# Patient Record
Sex: Female | Born: 1955
Health system: Southern US, Community
[De-identification: ages and names within clinical notes are randomized; demographics above are authoritative.]

## PROBLEM LIST (undated history)

## (undated) DIAGNOSIS — E785 Hyperlipidemia, unspecified: Secondary | ICD-10-CM

## (undated) DIAGNOSIS — I10 Essential (primary) hypertension: Secondary | ICD-10-CM

## (undated) DIAGNOSIS — Z8711 Personal history of peptic ulcer disease: Secondary | ICD-10-CM

## (undated) DIAGNOSIS — J449 Chronic obstructive pulmonary disease, unspecified: Secondary | ICD-10-CM

## (undated) DIAGNOSIS — G4730 Sleep apnea, unspecified: Secondary | ICD-10-CM

## (undated) DIAGNOSIS — K219 Gastro-esophageal reflux disease without esophagitis: Secondary | ICD-10-CM

## (undated) DIAGNOSIS — L570 Actinic keratosis: Secondary | ICD-10-CM

## (undated) DIAGNOSIS — K5792 Diverticulitis of intestine, part unspecified, without perforation or abscess without bleeding: Secondary | ICD-10-CM

## (undated) DIAGNOSIS — M1990 Unspecified osteoarthritis, unspecified site: Secondary | ICD-10-CM

## (undated) DIAGNOSIS — D509 Iron deficiency anemia, unspecified: Secondary | ICD-10-CM

## (undated) DIAGNOSIS — K635 Polyp of colon: Secondary | ICD-10-CM

## (undated) HISTORY — DX: Diverticulitis of intestine, part unspecified, without perforation or abscess without bleeding: K57.92

## (undated) HISTORY — DX: Actinic keratosis: L57.0

## (undated) HISTORY — DX: Sleep apnea, unspecified: G47.30

## (undated) HISTORY — PX: EYE SURGERY: SHX253

## (undated) HISTORY — DX: Hyperlipidemia, unspecified: E78.5

## (undated) HISTORY — DX: Gastro-esophageal reflux disease without esophagitis: K21.9

## (undated) HISTORY — DX: Polyp of colon: K63.5

---

## 1992-03-17 HISTORY — PX: SALPINGOOPHORECTOMY: SHX82

## 1992-03-17 HISTORY — PX: ABDOMINAL HYSTERECTOMY: SHX81

## 2005-02-24 ENCOUNTER — Ambulatory Visit: Payer: Self-pay | Admitting: Emergency Medicine

## 2005-03-27 ENCOUNTER — Ambulatory Visit: Payer: Self-pay | Admitting: Emergency Medicine

## 2012-02-23 LAB — HM COLONOSCOPY

## 2012-03-17 DIAGNOSIS — D509 Iron deficiency anemia, unspecified: Secondary | ICD-10-CM

## 2012-03-17 HISTORY — DX: Iron deficiency anemia, unspecified: D50.9

## 2013-01-11 ENCOUNTER — Ambulatory Visit: Payer: Self-pay | Admitting: Adult Health

## 2013-01-14 ENCOUNTER — Encounter (INDEPENDENT_AMBULATORY_CARE_PROVIDER_SITE_OTHER): Payer: Self-pay

## 2013-01-14 ENCOUNTER — Encounter: Payer: Self-pay | Admitting: Adult Health

## 2013-01-14 ENCOUNTER — Ambulatory Visit (INDEPENDENT_AMBULATORY_CARE_PROVIDER_SITE_OTHER): Payer: Managed Care, Other (non HMO) | Admitting: Adult Health

## 2013-01-14 VITALS — BP 118/66 | HR 78 | Temp 98.2°F | Resp 12 | Ht 65.0 in | Wt 191.5 lb

## 2013-01-14 DIAGNOSIS — G479 Sleep disorder, unspecified: Secondary | ICD-10-CM

## 2013-01-14 DIAGNOSIS — Z0000 Encounter for general adult medical examination without abnormal findings: Secondary | ICD-10-CM

## 2013-01-14 DIAGNOSIS — Z1239 Encounter for other screening for malignant neoplasm of breast: Secondary | ICD-10-CM

## 2013-01-14 DIAGNOSIS — Z23 Encounter for immunization: Secondary | ICD-10-CM

## 2013-01-14 MED ORDER — ZOLPIDEM TARTRATE 10 MG PO TABS: 10.0000 mg | ORAL_TABLET | Freq: Every evening | ORAL | Status: DC | PRN

## 2013-01-14 NOTE — Progress Notes (Signed)
Subjective:    Patient ID: Linda Henson, female    DOB: [birth year], 57 y.o.   MRN: [id number]  HPI  Patient presents to clinic to establish care. Patient moved to [place] for work last year. She has recently moved back to the area. She is feeling well overall. She has not seen a PCP in approximately 2 years 2/2 to move reported above. Her only concern is pertaining to inability to sleep. She has been taking an OTC product, sominex, for several years. The main ingredient is benadryl. This is no longer helping. She has also tried melatonin and valerian root without any benefit.    Past Medical History  Diagnosis Date  . Diverticulitis   . GERD (gastroesophageal reflux disease)   . Hyperlipidemia     Borderline   . Colon polyp     Repeat colonoscopy 2018     Past Surgical History  Procedure Laterality Date  . Abdominal hysterectomy  1994  . Salpingoophorectomy Right 1994     Family History  Problem Relation Age of Onset  . Heart disease Father 56    CAD - died of MI  . Hyperlipidemia Father   . Diabetes Paternal Grandmother   . Cancer Brother 41    stomach and esophageal cancer  . Hypothyroidism Sister      History   Social History  . Marital Status: Widowed    Spouse Name: N/A    Number of Children: 1  . Years of Education: 14   Occupational History  . Dedicated Estate manager/land agent   Social History Main Topics  . Smoking status: Former Smoker -- 4 years    Quit date: 12/15/1992  . Smokeless tobacco: Never Used  . Alcohol Use: 4.2 oz/week    7 Glasses of wine per week  . Drug Use: No  . Sexual Activity: Not on file   Other Topics Concern  . Not on file   Social History Narrative   Naziya grew up in [place], [place]. She is widowed for 5 years. She lives at home with her 9 year old mother. 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 She enjoys playing golf.      Review of Systems  Constitutional:  Negative.   HENT: Negative.   Eyes: Negative.   Respiratory: Negative.   Cardiovascular: Negative.   Gastrointestinal: Negative.   Endocrine: Negative.   Genitourinary: Negative.   Musculoskeletal: Negative.   Skin: Negative.   Allergic/Immunologic: Negative.   Neurological: Negative.   Hematological: Negative.   Psychiatric/Behavioral: Positive for sleep disturbance.       Objective:   Physical Exam  Constitutional: She is oriented to person, place, and time. She appears well-developed and well-nourished. No distress.  Pleasant 57 y/o female in NAD  HENT:  Head: Normocephalic and atraumatic.  Right Ear: External ear normal.  Left Ear: External ear normal.  Nose: Nose normal.  Mouth/Throat: Oropharynx is clear and moist.  Eyes: Conjunctivae and EOM are normal. Pupils are equal, round, and reactive to light.  Neck: Normal range of motion. Neck supple. No tracheal deviation present. No thyromegaly present.  Cardiovascular: Normal rate, regular rhythm, normal heart sounds and intact distal pulses.  Exam reveals no gallop and no friction rub.   No murmur heard. Pulmonary/Chest: Effort normal and breath sounds normal. No respiratory distress. She has no wheezes. She has no rales.  Abdominal: Soft. Bowel sounds are normal. She exhibits no  distension and no mass. There is no tenderness. There is no rebound and no guarding.  Musculoskeletal: Normal range of motion. She exhibits no edema and no tenderness.  Lymphadenopathy:    She has no cervical adenopathy.  Neurological: She is alert and oriented to person, place, and time. She has normal reflexes. No cranial nerve deficit. Coordination normal.  Skin: Skin is warm and dry.  Psychiatric: She has a normal mood and affect. Her behavior is normal. Judgment and thought content normal.          Assessment & Plan:

## 2013-01-14 NOTE — Patient Instructions (Signed)
   Thank you for choosing [facility] for your health care needs.  Please schedule your Mammogram at your earliest convenience and have them send me the report.  Have your fasting labs drawn. You may drink water.  Once I get the results of your labs I will fill out your Wellness form and notify you.  Start ambien 10 mg tablet - take 1/2 tablet as needed for sleep.

## 2013-01-15 DIAGNOSIS — Z0001 Encounter for general adult medical examination with abnormal findings: Secondary | ICD-10-CM | POA: Insufficient documentation

## 2013-01-15 DIAGNOSIS — Z1239 Encounter for other screening for malignant neoplasm of breast: Secondary | ICD-10-CM | POA: Insufficient documentation

## 2013-01-15 DIAGNOSIS — G479 Sleep disorder, unspecified: Secondary | ICD-10-CM | POA: Insufficient documentation

## 2013-01-15 DIAGNOSIS — Z79899 Other long term (current) drug therapy: Secondary | ICD-10-CM | POA: Insufficient documentation

## 2013-01-15 NOTE — Assessment & Plan Note (Signed)
Normal physical exam. Mammogram ordered. Labs: cbc w/diff, tsh, lipids, cmet, vit d

## 2013-01-15 NOTE — Assessment & Plan Note (Signed)
Mammogram ordered

## 2013-01-15 NOTE — Assessment & Plan Note (Signed)
Ambien 10 mg - 1/2 tablet at bedtime as needed.

## 2013-01-26 ENCOUNTER — Encounter: Payer: Self-pay | Admitting: Adult Health

## 2013-01-26 NOTE — Telephone Encounter (Signed)
Have you seen these labs she is referring to?

## 2013-01-27 ENCOUNTER — Telehealth: Payer: Self-pay | Admitting: *Deleted

## 2013-01-27 ENCOUNTER — Other Ambulatory Visit: Payer: Self-pay | Admitting: Adult Health

## 2013-01-27 DIAGNOSIS — D649 Anemia, unspecified: Secondary | ICD-10-CM

## 2013-01-27 DIAGNOSIS — K921 Melena: Secondary | ICD-10-CM

## 2013-01-27 NOTE — Telephone Encounter (Signed)
Pt notified, verbalized understanding to contact Dr. Urban Gibson office.

## 2013-01-27 NOTE — Telephone Encounter (Signed)
Spoke with pt regarding GI referral. States she has a GI doctor she has seen in the past, Dr. Chales Abrahams in [place], last colonoscopy and endoscopy were 2013, states results were "normal for me". Do you want her to follow up with him?

## 2013-01-27 NOTE — Telephone Encounter (Signed)
Blood in stool is concerning. I am also referring her to GI.

## 2013-01-27 NOTE — Telephone Encounter (Signed)
That would be fine - I want her to follow up as soon as possible.

## 2013-01-27 NOTE — Telephone Encounter (Signed)
Pt will come by office tomorrow afternoon for additional bloodwork. She is working in [place] today. She states no history of anemia. Has shortness of breath but this has been intermittent for years. No acute changes. States has noticed blood in stool starting yesterday, dark red/black, which is new for patient.

## 2013-01-28 ENCOUNTER — Other Ambulatory Visit: Payer: Self-pay | Admitting: Adult Health

## 2013-01-28 ENCOUNTER — Other Ambulatory Visit (INDEPENDENT_AMBULATORY_CARE_PROVIDER_SITE_OTHER): Payer: Managed Care, Other (non HMO)

## 2013-01-28 DIAGNOSIS — D649 Anemia, unspecified: Secondary | ICD-10-CM

## 2013-01-28 LAB — IRON: Iron: 19 ug/dL — ABNORMAL LOW (ref 42–145)

## 2013-01-28 LAB — FOLATE: Folate: 22.1 ng/mL (ref >5.9)

## 2013-01-28 MED ORDER — FERROUS SULFATE 325 (65 FE) MG PO TABS: 325.0000 mg | ORAL_TABLET | Freq: Two times a day (BID) | ORAL | Status: DC

## 2013-02-07 ENCOUNTER — Encounter: Payer: Self-pay | Admitting: Emergency Medicine

## 2013-02-08 ENCOUNTER — Telehealth: Payer: Self-pay

## 2013-02-08 NOTE — Telephone Encounter (Signed)
Notes Recorded by Dema Severin, RN on 01/31/2013 at 9:49 AM Left message, notifying pt of results and requested call back. ------  Notes Recorded by Orville Govern, NP on 01/28/2013 at 6:51 PM Significant iron deficiency. Needs to start iron supplement. I am referring to hematology for further evaluation of her low hemoglobin and iron. Find out if she already scheduled her appt with GI. She sees Dr. Chales Abrahams in [place] and said she was going to do this. She had been noticing blood in stool.              Pt states she needs referral to hematologist, and she prefers to see dr. Koleen Nimrod, and she also needs referral to GI, she is not going to see Dr. Chales Abrahams in [place] anymore.

## 2013-02-08 NOTE — Telephone Encounter (Signed)
Have we heard back from pt yet? If not, we need to send her a letter.

## 2013-02-09 ENCOUNTER — Other Ambulatory Visit: Payer: Self-pay | Admitting: Adult Health

## 2013-02-09 DIAGNOSIS — D509 Iron deficiency anemia, unspecified: Secondary | ICD-10-CM

## 2013-02-09 NOTE — Telephone Encounter (Signed)
It was at the bottom of Linda Henson's note "Pt states she needs referral to hematologist, and she prefers to see dr. Koleen Nimrod, and she also needs referral to GI, she is not going to see Dr. Chales Abrahams in [place] anymore."

## 2013-02-28 ENCOUNTER — Ambulatory Visit: Payer: Self-pay | Admitting: Internal Medicine

## 2013-02-28 ENCOUNTER — Ambulatory Visit: Payer: Self-pay | Admitting: Oncology

## 2013-02-28 LAB — IRON AND TIBC
Iron Bind.Cap.(Total): 411 ug/dL (ref 250–450)
Iron Saturation: 12 %
Iron: 48 ug/dL — ABNORMAL LOW (ref 50–170)
Unbound Iron-Bind.Cap.: 363 ug/dL

## 2013-03-17 ENCOUNTER — Ambulatory Visit: Payer: Self-pay | Admitting: Oncology

## 2013-03-17 ENCOUNTER — Ambulatory Visit: Payer: Self-pay | Admitting: Internal Medicine

## 2013-06-03 ENCOUNTER — Ambulatory Visit: Payer: Self-pay | Admitting: Internal Medicine

## 2013-06-03 LAB — CBC CANCER CENTER
BASOS ABS: 0.1 x10 3/mm (ref 0.0–0.1)
BASOS PCT: 0.8 %
EOS PCT: 2.8 %
Eosinophil #: 0.3 x10 3/mm (ref 0.0–0.7)
HCT: 41 % (ref 35.0–47.0)
HGB: 13.9 g/dL (ref 12.0–16.0)
Lymphocyte #: 3.2 x10 3/mm (ref 1.0–3.6)
Lymphocyte %: 34.8 %
MCH: 30.8 pg (ref 26.0–34.0)
MCHC: 33.9 g/dL (ref 32.0–36.0)
MCV: 91 fL (ref 80–100)
Monocyte #: 0.4 x10 3/mm (ref 0.2–0.9)
Monocyte %: 4.7 %
NEUTROS ABS: 5.2 x10 3/mm (ref 1.4–6.5)
NEUTROS PCT: 56.9 %
Platelet: 200 x10 3/mm (ref 150–440)
RBC: 4.5 10*6/uL (ref 3.80–5.20)
RDW: 14 % (ref 11.5–14.5)
WBC: 9.2 x10 3/mm (ref 3.6–11.0)

## 2013-06-03 LAB — IRON AND TIBC
Iron Bind.Cap.(Total): 389 ug/dL (ref 250–450)
Iron Saturation: 19 %
Iron: 74 ug/dL (ref 50–170)
Unbound Iron-Bind.Cap.: 315 ug/dL

## 2013-06-03 LAB — FERRITIN: Ferritin (ARMC): 26 ng/mL (ref 8–388)

## 2013-06-15 ENCOUNTER — Ambulatory Visit: Payer: Self-pay | Admitting: Internal Medicine

## 2013-07-19 ENCOUNTER — Other Ambulatory Visit: Payer: Self-pay | Admitting: Adult Health

## 2013-07-20 NOTE — Telephone Encounter (Signed)
Last visit 01/14/13, refill?

## 2014-02-22 ENCOUNTER — Ambulatory Visit (INDEPENDENT_AMBULATORY_CARE_PROVIDER_SITE_OTHER): Payer: Managed Care, Other (non HMO) | Admitting: Internal Medicine

## 2014-02-22 ENCOUNTER — Encounter (INDEPENDENT_AMBULATORY_CARE_PROVIDER_SITE_OTHER): Payer: Self-pay

## 2014-02-22 ENCOUNTER — Encounter: Payer: Self-pay | Admitting: Internal Medicine

## 2014-02-22 ENCOUNTER — Ambulatory Visit (INDEPENDENT_AMBULATORY_CARE_PROVIDER_SITE_OTHER)
Admission: RE | Admit: 2014-02-22 | Discharge: 2014-02-22 | Disposition: A | Discharge status: Home / Self Care | Payer: Managed Care, Other (non HMO) | Source: Ambulatory Visit | Attending: Internal Medicine | Admitting: Internal Medicine

## 2014-02-22 VITALS — BP 128/86 | HR 64 | Temp 98.3°F | Resp 14 | Ht 65.25 in | Wt 186.5 lb

## 2014-02-22 DIAGNOSIS — Z9889 Other specified postprocedural states: Secondary | ICD-10-CM

## 2014-02-22 DIAGNOSIS — R059 Cough, unspecified: Secondary | ICD-10-CM

## 2014-02-22 DIAGNOSIS — Z90710 Acquired absence of both cervix and uterus: Secondary | ICD-10-CM

## 2014-02-22 DIAGNOSIS — Z1239 Encounter for other screening for malignant neoplasm of breast: Secondary | ICD-10-CM

## 2014-02-22 DIAGNOSIS — R05 Cough: Secondary | ICD-10-CM

## 2014-02-22 DIAGNOSIS — K625 Hemorrhage of anus and rectum: Secondary | ICD-10-CM

## 2014-02-22 DIAGNOSIS — E669 Obesity, unspecified: Secondary | ICD-10-CM

## 2014-02-22 DIAGNOSIS — M5442 Lumbago with sciatica, left side: Secondary | ICD-10-CM

## 2014-02-22 DIAGNOSIS — Z0000 Encounter for general adult medical examination without abnormal findings: Secondary | ICD-10-CM

## 2014-02-22 DIAGNOSIS — Z131 Encounter for screening for diabetes mellitus: Secondary | ICD-10-CM

## 2014-02-22 DIAGNOSIS — J209 Acute bronchitis, unspecified: Secondary | ICD-10-CM

## 2014-02-22 DIAGNOSIS — D509 Iron deficiency anemia, unspecified: Secondary | ICD-10-CM

## 2014-02-22 LAB — POCT URINALYSIS DIPSTICK
Bilirubin, UA: NEGATIVE
Glucose, UA: NEGATIVE
KETONES UA: NEGATIVE
LEUKOCYTES UA: NEGATIVE
Nitrite, UA: NEGATIVE
PH UA: 6.5
Protein, UA: NEGATIVE
Spec Grav, UA: 1.02
Urobilinogen, UA: 1

## 2014-02-22 LAB — URINALYSIS, ROUTINE W REFLEX MICROSCOPIC
Bilirubin Urine: NEGATIVE
KETONES UR: NEGATIVE
Leukocytes, UA: NEGATIVE
Nitrite: NEGATIVE
SPECIFIC GRAVITY, URINE: 1.02 (ref 1.000–1.030)
TOTAL PROTEIN, URINE-UPE24: NEGATIVE
URINE GLUCOSE: NEGATIVE
Urobilinogen, UA: 0.2 (ref 0.0–1.0)
pH: 6.5 (ref 5.0–8.0)

## 2014-02-22 LAB — CBC WITH DIFFERENTIAL/PLATELET
HCT: 40 % (ref 36.0–46.0)
Hemoglobin: 13.7 g/dL (ref 12.0–15.0)
MCHC: 34.3 g/dL (ref 30.0–36.0)
MCV: 93.8 fl (ref 78.0–100.0)
Platelets: 244 10*3/uL (ref 150.0–400.0)
RBC: 4.26 Mil/uL (ref 3.87–5.11)
RDW: 13.1 % (ref 11.5–15.5)
WBC: 10.9 10*3/uL — ABNORMAL HIGH (ref 4.0–10.5)

## 2014-02-22 LAB — BASIC METABOLIC PANEL
BUN: 13 mg/dL (ref 6–23)
CO2: 27 meq/L (ref 19–32)
Calcium: 9.1 mg/dL (ref 8.4–10.5)
Chloride: 103 mEq/L (ref 96–112)
Creatinine, Ser: 0.8 mg/dL (ref 0.4–1.2)
GFR: 81.58 mL/min (ref >60.00)
GLUCOSE: 88 mg/dL (ref 70–99)
Potassium: 4.1 mEq/L (ref 3.5–5.1)
SODIUM: 138 meq/L (ref 135–145)

## 2014-02-22 LAB — IRON AND TIBC
%SAT: 17 % — ABNORMAL LOW (ref 20–55)
Iron: 69 ug/dL (ref 42–145)
TIBC: 416 ug/dL (ref 250–470)
UIBC: 347 ug/dL (ref 125–400)

## 2014-02-22 LAB — FERRITIN: FERRITIN: 19.8 ng/mL (ref 10.0–291.0)

## 2014-02-22 MED ORDER — ZOLPIDEM TARTRATE 10 MG PO TABS: 10.0000 mg | ORAL_TABLET | Freq: Every evening | ORAL | Status: DC | PRN

## 2014-02-22 MED ORDER — FLUTICASONE PROPIONATE 50 MCG/ACT NA SUSP: NASAL | Status: DC

## 2014-02-22 NOTE — Progress Notes (Signed)
Pre-visit discussion using our clinic review tool. No additional management support is needed unless otherwise documented below in the visit note.  

## 2014-02-22 NOTE — Patient Instructions (Signed)
CHEST X RAY TO BE DONE AT [facility]  WE WILL ALL YOU MOM TO GET HER LABS DONE ASAP AND I WILL SEE HER Thursday AT 2:00   RETURN FOR FASTING LABS ASAP (MAKE APPT )   Health Maintenance Adopting a healthy lifestyle and getting preventive care can go a long way to promote health and wellness. Talk with your health care provider about what schedule of regular examinations is right for you. This is a good chance for you to check in with your provider about disease prevention and staying healthy. In between checkups, there are plenty of things you can do on your own. Experts have done a lot of research about which lifestyle changes and preventive measures are most likely to keep you healthy. Ask your health care provider for more information. WEIGHT AND DIET  Eat a healthy diet  Be sure to include plenty of vegetables, fruits, low-fat dairy products, and lean protein.  Do not eat a lot of foods high in solid fats, added sugars, or salt.  Get regular exercise. This is one of the most important things you can do for your health.  Most adults should exercise for at least 150 minutes each week. The exercise should increase your heart rate and make you sweat (moderate-intensity exercise).  Most adults should also do strengthening exercises at least twice a week. This is in addition to the moderate-intensity exercise.  Maintain a healthy weight  Body mass index (BMI) is a measurement that can be used to identify possible weight problems. It estimates body fat based on height and weight. Your health care provider can help determine your BMI and help you achieve or maintain a healthy weight.  For females 15 years of age and older:   A BMI below 18.5 is considered underweight.  A BMI of 18.5 to 24.9 is normal.  A BMI of 25 to 29.9 is considered overweight.  A BMI of 30 and above is considered obese.  Watch levels of cholesterol and blood lipids  You should start having your blood tested  for lipids and cholesterol at 58 years of age, then have this test every 5 years.  You may need to have your cholesterol levels checked more often if:  Your lipid or cholesterol levels are high.  You are older than 58 years of age.  You are at high risk for heart disease.  CANCER SCREENING   Lung Cancer  Lung cancer screening is recommended for adults 71-20 years old who are at high risk for lung cancer because of a history of smoking.  A yearly low-dose CT scan of the lungs is recommended for people who:  Currently smoke.  Have quit within the past 15 years.  Have at least a 30-pack-year history of smoking. A pack year is smoking an average of one pack of cigarettes a day for 1 year.  Yearly screening should continue until it has been 15 years since you quit.  Yearly screening should stop if you develop a health problem that would prevent you from having lung cancer treatment.  Breast Cancer  Practice breast self-awareness. This means understanding how your breasts normally appear and feel.  It also means doing regular breast self-exams. Let your health care provider know about any changes, no matter how small.  If you are in your 20s or 30s, you should have a clinical breast exam (CBE) by a health care provider every 1-3 years as part of a regular health exam.  If you  are 48 or older, have a CBE every year. Also consider having a breast X-ray (mammogram) every year.  If you have a family history of breast cancer, talk to your health care provider about genetic screening.  If you are at high risk for breast cancer, talk to your health care provider about having an MRI and a mammogram every year.  Breast cancer gene (BRCA) assessment is recommended for women who have family members with BRCA-related cancers. BRCA-related cancers include:  Breast.  Ovarian.  Tubal.  Peritoneal cancers.  Results of the assessment will determine the need for genetic counseling and  BRCA1 and BRCA2 testing. Cervical Cancer Routine pelvic examinations to screen for cervical cancer are no longer recommended for nonpregnant women who are considered low risk for cancer of the pelvic organs (ovaries, uterus, and vagina) and who do not have symptoms. A pelvic examination may be necessary if you have symptoms including those associated with pelvic infections. Ask your health care provider if a screening pelvic exam is right for you.   The Pap test is the screening test for cervical cancer for women who are considered at risk.  If you had a hysterectomy for a problem that was not cancer or a condition that could lead to cancer, then you no longer need Pap tests.  If you are older than 65 years, and you have had normal Pap tests for the past 10 years, you no longer need to have Pap tests.  If you have had past treatment for cervical cancer or a condition that could lead to cancer, you need Pap tests and screening for cancer for at least 20 years after your treatment.  If you no longer get a Pap test, assess your risk factors if they change (such as having a new sexual partner). This can affect whether you should start being screened again.  Some women have medical problems that increase their chance of getting cervical cancer. If this is the case for you, your health care provider may recommend more frequent screening and Pap tests.  The human papillomavirus (HPV) test is another test that may be used for cervical cancer screening. The HPV test looks for the virus that can cause cell changes in the cervix. The cells collected during the Pap test can be tested for HPV.  The HPV test can be used to screen women 40 years of age and older. Getting tested for HPV can extend the interval between normal Pap tests from three to five years.  An HPV test also should be used to screen women of any age who have unclear Pap test results.  After 58 years of age, women should have HPV testing as  often as Pap tests.  Colorectal Cancer  This type of cancer can be detected and often prevented.  Routine colorectal cancer screening usually begins at 58 years of age and continues through 58 years of age.  Your health care provider may recommend screening at an earlier age if you have risk factors for colon cancer.  Your health care provider may also recommend using home test kits to check for hidden blood in the stool.  A small camera at the end of a tube can be used to examine your colon directly (sigmoidoscopy or colonoscopy). This is done to check for the earliest forms of colorectal cancer.  Routine screening usually begins at age 47.  Direct examination of the colon should be repeated every 5-10 years through 58 years of age. However, you may  need to be screened more often if early forms of precancerous polyps or small growths are found. Skin Cancer  Check your skin from head to toe regularly.  Tell your health care provider about any new moles or changes in moles, especially if there is a change in a mole's shape or color.  Also tell your health care provider if you have a mole that is larger than the size of a pencil eraser.  Always use sunscreen. Apply sunscreen liberally and repeatedly throughout the day.  Protect yourself by wearing long sleeves, pants, a wide-brimmed hat, and sunglasses whenever you are outside. HEART DISEASE, DIABETES, AND HIGH BLOOD PRESSURE   Have your blood pressure checked at least every 1-2 years. High blood pressure causes heart disease and increases the risk of stroke.  If you are between 31 years and 9 years old, ask your health care provider if you should take aspirin to prevent strokes.  Have regular diabetes screenings. This involves taking a blood sample to check your fasting blood sugar level.  If you are at a normal weight and have a low risk for diabetes, have this test once every three years after 58 years of age.  If you are  overweight and have a high risk for diabetes, consider being tested at a younger age or more often. PREVENTING INFECTION  Hepatitis B  If you have a higher risk for hepatitis B, you should be screened for this virus. You are considered at high risk for hepatitis B if:  You were born in a country where hepatitis B is common. Ask your health care provider which countries are considered high risk.  Your parents were born in a high-risk country, and you have not been immunized against hepatitis B (hepatitis B vaccine).  You have HIV or AIDS.  You use needles to inject street drugs.  You live with someone who has hepatitis B.  You have had sex with someone who has hepatitis B.  You get hemodialysis treatment.  You take certain medicines for conditions, including cancer, organ transplantation, and autoimmune conditions. Hepatitis C  Blood testing is recommended for:  Everyone born from [birth year] through 1965.  Anyone with known risk factors for hepatitis C. Sexually transmitted infections (STIs)  You should be screened for sexually transmitted infections (STIs) including gonorrhea and chlamydia if:  You are sexually active and are younger than 58 years of age.  You are older than 58 years of age and your health care provider tells you that you are at risk for this type of infection.  Your sexual activity has changed since you were last screened and you are at an increased risk for chlamydia or gonorrhea. Ask your health care provider if you are at risk.  If you do not have HIV, but are at risk, it may be recommended that you take a prescription medicine daily to prevent HIV infection. This is called pre-exposure prophylaxis (PrEP). You are considered at risk if:  You are sexually active and do not regularly use condoms or know the HIV status of your partner(s).  You take drugs by injection.  You are sexually active with a partner who has HIV. Talk with your health care provider  about whether you are at high risk of being infected with HIV. If you choose to begin PrEP, you should first be tested for HIV. You should then be tested every 3 months for as long as you are taking PrEP.  PREGNANCY   If you are  premenopausal and you may become pregnant, ask your health care provider about preconception counseling.  If you may become pregnant, take 400 to 800 micrograms (mcg) of folic acid every day.  If you want to prevent pregnancy, talk to your health care provider about birth control (contraception). OSTEOPOROSIS AND MENOPAUSE   Osteoporosis is a disease in which the bones lose minerals and strength with aging. This can result in serious bone fractures. Your risk for osteoporosis can be identified using a bone density scan.  If you are 62 years of age or older, or if you are at risk for osteoporosis and fractures, ask your health care provider if you should be screened.  Ask your health care provider whether you should take a calcium or vitamin D supplement to lower your risk for osteoporosis.  Menopause may have certain physical symptoms and risks.  Hormone replacement therapy may reduce some of these symptoms and risks. Talk to your health care provider about whether hormone replacement therapy is right for you.  HOME CARE INSTRUCTIONS   Schedule regular health, dental, and eye exams.  Stay current with your immunizations.   Do not use any tobacco products including cigarettes, chewing tobacco, or electronic cigarettes.  If you are pregnant, do not drink alcohol.  If you are breastfeeding, limit how much and how often you drink alcohol.  Limit alcohol intake to no more than 1 drink per day for nonpregnant women. One drink equals 12 ounces of beer, 5 ounces of wine, or 1 ounces of hard liquor.  Do not use street drugs.  Do not share needles.  Ask your health care provider for help if you need support or information about quitting drugs.  Tell your  health care provider if you often feel depressed.  Tell your health care provider if you have ever been abused or do not feel safe at home. Document Released: 09/16/2010 Document Revised: 07/18/2013 Document Reviewed: 02/02/2013 [facility] Patient Information 2015 [place], [place]. This information is not intended to replace advice given to you by your health care provider. Make sure you discuss any questions you have with your health care provider.

## 2014-02-22 NOTE — Progress Notes (Signed)
Patient ID: Linda Henson, female   DOB: 25-Nov-1955, 58 y.o.   MRN: [id number]   Subjective:     Linda Henson is a 58 y.o. female and is here for a comprehensive physical exam. The patient reports that she is S/p TAH in 1993 with unilateral oophorectomy   IDA treated with iron by [facility] and colonoscopy referral was done but no colonoscopy was done.  Has history of gastric ulcer and colon polyps , last colonoscopy was in  2013 in [place].  Had blood in stool on Monday  Treated for sinus infection by [facility] last Wednesday with augmentin  And inhaler,  But stopped the abx  On Monday when she had the blood in stool .  Pro air is helping ,  Has been evaluatedby pulmonogy in the past for chronic cough,  PFTS were reportedly normal ,  Sone 8 yrs ago in [place].  Using guaifenesin.  Uses nexium daily.  TAKES BENADRYL EVERY NIGHT INSTEAD OF AMBIEN   And cough is worse at night.  Dry cough .  Wants a chest x ray due to persistent chest congestion.  History of recurrent bronchitis. . X smoker quit 10 yrs ago,  Works in Surveyor, quantity,  Lots of talking during the day works for [employer]     History   Social History  . Marital Status: Widowed    Spouse Name: N/A    Number of Children: 1  . Years of Education: 14   Occupational History  . Dedicated Mining engineer   Social History Main Topics  . Smoking status: Former Smoker -- 4 years    Quit date: 12/15/1992  . Smokeless tobacco: Never Used  . Alcohol Use: 4.2 oz/week    7 Glasses of wine per week  . Drug Use: No  . Sexual Activity: Not on file   Other Topics Concern  . Not on file   Social History Narrative   Linda Henson grew up in [place], [place]. She is widowed for 5 years. She lives at home with her 45 year old mother. 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 She enjoys playing golf.    Health Maintenance  Topic Date Due  . PAP SMEAR  04/03/1973  .  TETANUS/TDAP  04/03/1974  . INFLUENZA VACCINE  10/16/2014  . MAMMOGRAM  01/29/2015  . COLONOSCOPY  02/22/2022    The following portions of the patient's history were reviewed and updated as appropriate: allergies, current medications, past family history, past medical history, past social history, past surgical history and problem list.  Review of Systems A comprehensive review of systems was negative.   Objective:   BP 128/86 mmHg  Pulse 64  Temp(Src) 98.3 F (36.8 C) (Oral)  Resp 14  Ht 5' 5.25" (1.657 m)  Wt 186 lb 8 oz (84.596 kg)  BMI 30.81 kg/m2  SpO2 96%  General appearance: alert, cooperative and appears stated age Head: Normocephalic, without obvious abnormality, atraumatic Eyes: conjunctivae/corneas clear. PERRL, EOM's intact. Fundi benign. Ears: normal TM's and external ear canals both ears Nose: Nares normal. Septum midline. Mucosa normal. No drainage or sinus tenderness. Throat: lips, mucosa, and tongue normal; teeth and gums normal Neck: no adenopathy, no carotid bruit, no JVD, supple, symmetrical, trachea midline and thyroid not enlarged, symmetric, no tenderness/mass/nodules Lungs: clear to auscultation bilaterally Breasts: normal appearance, no masses or tenderness Heart: regular rate and rhythm, S1, S2 normal, no murmur,  click, rub or gallop Abdomen: soft, non-tender; bowel sounds normal; no masses,  no organomegaly Extremities: extremities normal, atraumatic, no cyanosis or edema Pulses: 2+ and symmetric Skin: Skin color, texture, turgor normal. No rashes or lesions Neurologic: Alert and oriented X 3, normal strength and tone. Normal symmetric reflexes. Normal coordination and gait.   .    Assessment and Plan:   Routine general medical examination at a health care facility Annual wellness  exam was done as well as a comprehensive physical exam and management of acute and chronic conditions .  During the course of the visit the patient was educated and  counseled about appropriate screening and preventive services including :  diabetes screening, lipid analysis with projected  10 year  risk for CAD , nutrition counseling, colorectal cancer screening, and recommended immunizations.  Printed recommendations for health maintenance screenings was given.   Obesity I have addressed  BMI and recommended wt loss of 10% of body weigh over the next 6 months using a low glycemic index diet and regular exercise a minimum of 5 days per week.    Acute bronchitis Exam is norma.  Chest x ray is normal as well.   Updated Medication List Outpatient Encounter Prescriptions as of 02/22/2014  Medication Sig  . diphenhydrAMINE (SOMINEX) 25 MG tablet Take 25 mg by mouth at bedtime as needed for sleep.  . Esomeprazole Magnesium (NEXIUM 24HR PO) Take by mouth.  . ferrous sulfate 325 (65 FE) MG tablet Take 1 tablet (325 mg total) by mouth 2 (two) times daily with a meal. (Patient not taking: Reported on 02/22/2014)  . fluticasone (FLONASE) 50 MCG/ACT nasal spray 2 sprays in each nostril once daily  . zolpidem (AMBIEN) 10 MG tablet TAKE 1 TABLET BY MOUTH EVERY NIGHT AT BEDTIME AS NEEDED FOR SLEEP (Patient not taking: Reported on 02/22/2014)  . zolpidem (AMBIEN) 10 MG tablet Take 1 tablet (10 mg total) by mouth at bedtime as needed for sleep.

## 2014-02-23 ENCOUNTER — Telehealth: Payer: Self-pay | Admitting: *Deleted

## 2014-02-23 DIAGNOSIS — E669 Obesity, unspecified: Secondary | ICD-10-CM | POA: Insufficient documentation

## 2014-02-23 DIAGNOSIS — K625 Hemorrhage of anus and rectum: Secondary | ICD-10-CM | POA: Insufficient documentation

## 2014-02-23 DIAGNOSIS — J40 Bronchitis, not specified as acute or chronic: Secondary | ICD-10-CM | POA: Insufficient documentation

## 2014-02-23 DIAGNOSIS — J209 Acute bronchitis, unspecified: Secondary | ICD-10-CM | POA: Insufficient documentation

## 2014-02-23 NOTE — Telephone Encounter (Signed)
Linda Henson - Is this now your pt?

## 2014-02-23 NOTE — Assessment & Plan Note (Signed)
Exam is norma.  Chest x ray is normal as well.

## 2014-02-23 NOTE — Assessment & Plan Note (Addendum)
I have addressed  BMI and recommended wt loss of 10% of body weigh over the next 6 months using a low glycemic index diet and regular exercise a minimum of 5 days per week.   

## 2014-02-23 NOTE — Telephone Encounter (Signed)
Pt coming tomorrow what labs and dx? 

## 2014-02-23 NOTE — Assessment & Plan Note (Signed)

## 2014-02-24 ENCOUNTER — Other Ambulatory Visit: Payer: Self-pay | Admitting: Internal Medicine

## 2014-02-24 ENCOUNTER — Other Ambulatory Visit (INDEPENDENT_AMBULATORY_CARE_PROVIDER_SITE_OTHER): Payer: Managed Care, Other (non HMO)

## 2014-02-24 DIAGNOSIS — Z131 Encounter for screening for diabetes mellitus: Secondary | ICD-10-CM

## 2014-02-24 DIAGNOSIS — Z13220 Encounter for screening for lipoid disorders: Secondary | ICD-10-CM

## 2014-02-24 DIAGNOSIS — E669 Obesity, unspecified: Secondary | ICD-10-CM

## 2014-02-24 LAB — COMPREHENSIVE METABOLIC PANEL
ALT: 34 U/L (ref 0–35)
AST: 23 U/L (ref 0–37)
Albumin: 3.9 g/dL (ref 3.5–5.2)
Alkaline Phosphatase: 60 U/L (ref 39–117)
BILIRUBIN TOTAL: 0.7 mg/dL (ref 0.2–1.2)
BUN: 16 mg/dL (ref 6–23)
CO2: 25 meq/L (ref 19–32)
CREATININE: 0.7 mg/dL (ref 0.4–1.2)
Calcium: 9 mg/dL (ref 8.4–10.5)
Chloride: 106 mEq/L (ref 96–112)
GFR: 89.59 mL/min (ref >60.00)
GLUCOSE: 104 mg/dL — AB (ref 70–99)
Potassium: 4.2 mEq/L (ref 3.5–5.1)
Sodium: 135 mEq/L (ref 135–145)
Total Protein: 7.1 g/dL (ref 6.0–8.3)

## 2014-02-24 LAB — LIPID PANEL
CHOLESTEROL: 201 mg/dL — AB (ref 0–200)
HDL: 61.6 mg/dL (ref >39.00)
LDL Cholesterol: 115 mg/dL — ABNORMAL HIGH (ref 0–99)
NonHDL: 139.4
TRIGLYCERIDES: 123 mg/dL (ref 0.0–149.0)
Total CHOL/HDL Ratio: 3
VLDL: 24.6 mg/dL (ref 0.0–40.0)

## 2014-02-24 NOTE — Telephone Encounter (Signed)
No, not sure why she was on my schedule.  Too busy to ask questions,  Just did her annual and bronchitis follow up.

## 2014-02-24 NOTE — Addendum Note (Signed)
Addended by: Johnsie Cancel on: 02/24/2014 09:14 AM   Modules accepted: Orders

## 2014-02-24 NOTE — Telephone Encounter (Signed)
Linda Henson, This will be a patient of [facility]'s. I am not sure what labs she needs. I would recommend we wait until she establishes care with [facility] to draw labs.

## 2014-02-24 NOTE — Progress Notes (Signed)
Labs not drawn, no order.  As per note, no labs needed at this time.

## 2014-02-27 ENCOUNTER — Encounter: Payer: Self-pay | Admitting: *Deleted

## 2014-02-28 ENCOUNTER — Telehealth: Payer: Self-pay | Admitting: Internal Medicine

## 2014-02-28 NOTE — Telephone Encounter (Signed)
Patient needs to sign and date and provide insurance ID number called patient and left message.

## 2014-04-14 ENCOUNTER — Ambulatory Visit: Payer: Self-pay | Admitting: Internal Medicine

## 2014-04-14 LAB — HM MAMMOGRAPHY: HM Mammogram: NEGATIVE

## 2014-04-18 ENCOUNTER — Encounter: Payer: Self-pay | Admitting: *Deleted

## 2014-09-23 ENCOUNTER — Other Ambulatory Visit: Payer: Self-pay | Admitting: Internal Medicine

## 2014-09-25 NOTE — Telephone Encounter (Signed)
Last filled and last OV on 02/22/14, 30 tablets with 5 refills.  Seen you once, former [facility] pt, Okay to refill?

## 2014-09-26 ENCOUNTER — Telehealth: Payer: Self-pay

## 2014-09-26 ENCOUNTER — Telehealth: Payer: Self-pay | Admitting: *Deleted

## 2014-09-26 NOTE — Telephone Encounter (Signed)
Called patient and made aware Rx was sent to pharmacy. Patient verbalized understanding. Appointment made with NP to establish care and manage medications.

## 2014-09-26 NOTE — Telephone Encounter (Signed)
Left vm for pt to return my call, appt needs to be scheduled  Per Dr Derrel Nip - zolpidem (AMBIEN) 10 MG tablet medication was refilled as requested, but only for 30 days only. PLEASE SCHEDULE APPT WITH [facility] AS NEW PCP

## 2014-09-26 NOTE — Telephone Encounter (Signed)
Refill for 30 days only.  PLEASE SCHEDULE APPT WITH [facility] AS NEW PCP

## 2014-10-01 ENCOUNTER — Encounter: Payer: Self-pay | Admitting: Family Medicine

## 2014-11-03 ENCOUNTER — Ambulatory Visit (INDEPENDENT_AMBULATORY_CARE_PROVIDER_SITE_OTHER): Payer: Managed Care, Other (non HMO) | Admitting: Nurse Practitioner

## 2014-11-03 ENCOUNTER — Encounter: Payer: Self-pay | Admitting: Nurse Practitioner

## 2014-11-03 VITALS — BP 110/70 | HR 72 | Temp 98.2°F | Resp 16 | Ht 65.0 in | Wt 195.3 lb

## 2014-11-03 DIAGNOSIS — G479 Sleep disorder, unspecified: Secondary | ICD-10-CM

## 2014-11-03 DIAGNOSIS — E669 Obesity, unspecified: Secondary | ICD-10-CM

## 2014-11-03 DIAGNOSIS — R1012 Left upper quadrant pain: Secondary | ICD-10-CM

## 2014-11-03 DIAGNOSIS — K219 Gastro-esophageal reflux disease without esophagitis: Secondary | ICD-10-CM

## 2014-11-03 DIAGNOSIS — G4733 Obstructive sleep apnea (adult) (pediatric): Secondary | ICD-10-CM

## 2014-11-03 DIAGNOSIS — Z9989 Dependence on other enabling machines and devices: Secondary | ICD-10-CM

## 2014-11-03 MED ORDER — ZOLPIDEM TARTRATE 10 MG PO TABS: 10.0000 mg | ORAL_TABLET | Freq: Every evening | ORAL | Status: DC | PRN

## 2014-11-03 NOTE — Progress Notes (Signed)
Pre visit review using our clinic review tool, if applicable. No additional management support is needed unless otherwise documented below in the visit note. 

## 2014-11-03 NOTE — Patient Instructions (Signed)
Follow up in Dec. For repeat of lab work.

## 2014-11-03 NOTE — Progress Notes (Signed)
Patient ID: Linda Henson, female    DOB: 1955-12-20  Age: 59 y.o. MRN: [id number]  CC: Medication Refill   HPI Linda Henson presents for medications. She is already established with [place] and was a former Linda Salvage, NP patient.   1) Sleep study- CPAP getting set up for this, asked for copy of records regarding sleep study results for scanning into our system since we do not currently have.   2) Ambien- Doing well. Takes 1/2 tablet as needed for sleep.   3) Pt does not like nexium and is still having some LUQ twitching (see below), she states this was worked up previously and found to be GERD. She would like to try something else.   4) Rash- between butt cheeks- 2-3 months, hydrocortisone is helpful   5) Left upper quadrant- off and on for years, last 2-3 months twinges happens every day, takes probiotics and they are helpful, BMs normal yesterday last one   History Linda Henson has a past medical history of Diverticulitis; GERD (gastroesophageal reflux disease); Hyperlipidemia; Colon polyp; and Sleep apnea.   She has past surgical history that includes Abdominal hysterectomy (1994) and Salpingoophorectomy (Right, 1994).   Her family history includes Cancer (age of onset: 47) in her brother; Diabetes in her paternal grandmother; Heart disease (age of onset: 41) in her father; Hyperlipidemia in her father; Hypothyroidism in her sister.She reports that she quit smoking about 21 years ago. She has never used smokeless tobacco. She reports that she drinks about 4.2 oz of alcohol per week. She reports that she does not use illicit drugs.  Outpatient Prescriptions Prior to Visit  Medication Sig Dispense Refill  . fluticasone (FLONASE) 50 MCG/ACT nasal spray 2 sprays in each nostril once daily 16 g 6  . zolpidem (AMBIEN) 10 MG tablet TAKE 1 TABLET BY MOUTH EVERY NIGHT AT BEDTIME AS NEEDED FOR SLEEP 15 tablet 3  . zolpidem (AMBIEN) 10 MG tablet TAKE 1 TABLET BY MOUTH EVERY NIGHT AT BEDTIME AS NEEDED FOR  SLEEP 30 tablet 0  . diphenhydrAMINE (SOMINEX) 25 MG tablet Take 25 mg by mouth at bedtime as needed for sleep.    . Esomeprazole Magnesium (NEXIUM 24HR PO) Take by mouth.    . ferrous sulfate 325 (65 FE) MG tablet Take 1 tablet (325 mg total) by mouth 2 (two) times daily with a meal. (Patient not taking: Reported on 02/22/2014) 60 tablet 2  . zolpidem (AMBIEN) 10 MG tablet Take 1 tablet (10 mg total) by mouth at bedtime as needed for sleep. (Patient not taking: Reported on 11/03/2014) 30 tablet 5   No facility-administered medications prior to visit.    ROS Review of Systems  Constitutional: Negative for fever, chills, diaphoresis and fatigue.  Respiratory: Negative for chest tightness, shortness of breath and wheezing.   Cardiovascular: Negative for chest pain, palpitations and leg swelling.  Gastrointestinal: Positive for abdominal pain. Negative for nausea, vomiting, diarrhea, constipation, blood in stool, abdominal distention and rectal pain.  Skin: Positive for rash.  Neurological: Negative for dizziness, weakness, numbness and headaches.  Psychiatric/Behavioral: Positive for sleep disturbance. Negative for suicidal ideas. The patient is nervous/anxious.     Objective:  BP 110/70 mmHg  Pulse 72  Temp(Src) 98.2 F (36.8 C)  Resp 16  Ht 5\' 5"  (1.651 m)  Wt 195 lb 4.8 oz (88.587 kg)  BMI 32.50 kg/m2  SpO2 97%  Physical Exam  Constitutional: She is oriented to person, place, and time. She appears well-developed and well-nourished. No distress.  HENT:  Head: Normocephalic and atraumatic.  Right Ear: External ear normal.  Left Ear: External ear normal.  Cardiovascular: Normal rate, regular rhythm, normal heart sounds and intact distal pulses.  Exam reveals no gallop and no friction rub.   No murmur heard. Pulmonary/Chest: Effort normal and breath sounds normal. No respiratory distress. She has no wheezes. She has no rales. She exhibits no tenderness.  Abdominal: Soft. Bowel  sounds are normal. She exhibits no distension and no mass. There is no tenderness. There is no rebound and no guarding.  Neurological: She is alert and oriented to person, place, and time. No cranial nerve deficit. She exhibits normal muscle tone. Coordination normal.  Skin: Skin is warm and dry. She is not diaphoretic.  Did not examine rash today, deferred due to pt request; asked pt to let me know if it changes or worsens  Psychiatric: She has a normal mood and affect. Her behavior is normal. Judgment and thought content normal.   Assessment & Plan:   [name] was seen today for medication refill.  Diagnoses and all orders for this visit:  OSA on CPAP  Sleep disturbance  Gastroesophageal reflux disease, esophagitis presence not specified  LUQ pain  Obesity  Other orders -     zolpidem (AMBIEN) 10 MG tablet; Take 1 tablet (10 mg total) by mouth at bedtime as needed. for sleep   I have discontinued [name]'s Esomeprazole Magnesium (NEXIUM 24HR PO), diphenhydrAMINE, ferrous sulfate, zolpidem, and zolpidem. I have also changed her zolpidem. Additionally, I am having her maintain her fluticasone and pantoprazole.  Meds ordered this encounter  Medications  . pantoprazole (PROTONIX) 40 MG tablet    Sig: Take 40 mg by mouth daily.  Marland Kitchen zolpidem (AMBIEN) 10 MG tablet    Sig: Take 1 tablet (10 mg total) by mouth at bedtime as needed. for sleep    Dispense:  30 tablet    Refill:  5    Order Specific Question:  Supervising Provider    Answer:  Crecencio Mc [[id number]]     Follow-up: Return in about 4 months (around 03/05/2015).

## 2014-11-12 ENCOUNTER — Encounter: Payer: Self-pay | Admitting: Nurse Practitioner

## 2014-11-12 DIAGNOSIS — G4733 Obstructive sleep apnea (adult) (pediatric): Secondary | ICD-10-CM | POA: Insufficient documentation

## 2014-11-12 DIAGNOSIS — R1012 Left upper quadrant pain: Secondary | ICD-10-CM | POA: Insufficient documentation

## 2014-11-12 DIAGNOSIS — Z9989 Dependence on other enabling machines and devices: Secondary | ICD-10-CM | POA: Insufficient documentation

## 2014-11-12 DIAGNOSIS — K219 Gastro-esophageal reflux disease without esophagitis: Secondary | ICD-10-CM | POA: Insufficient documentation

## 2014-11-12 HISTORY — DX: Left upper quadrant pain: R10.12

## 2014-11-12 NOTE — Assessment & Plan Note (Signed)
Pt is stable on Ambien 10 mg (taking 1/2 tablet) as needed QHS. Will follow.

## 2014-11-12 NOTE — Assessment & Plan Note (Signed)
Wt Readings from Last 3 Encounters:  11/03/14 195 lb 4.8 oz (88.587 kg)  02/22/14 186 lb 8 oz (84.596 kg)  01/14/13 191 lb 8 oz (86.864 kg)   Asked pt to watch diet, cut down on portions and carbs, and add exercise to weekly routine.

## 2014-11-12 NOTE — Assessment & Plan Note (Signed)
Stable. No findings, asked pt to continue probiotics and if continues, changes, or worsens will obtain US.

## 2014-11-12 NOTE — Assessment & Plan Note (Signed)
Pt is not taking Nexium. She is still having GERD symptoms and she would like to try something else. Will switch to Protonix 40 mg daily for GERD. Asked pt to watch for trigger foods.

## 2014-11-12 NOTE — Assessment & Plan Note (Signed)
She recently had a sleep study and was dx with OA. She is getting set up for her CPAP supplies.

## 2015-02-02 ENCOUNTER — Encounter: Payer: Self-pay | Admitting: *Deleted

## 2015-02-05 ENCOUNTER — Encounter
Admission: RE | Disposition: A | Discharge status: Home / Self Care | Payer: Self-pay | Source: Ambulatory Visit | Attending: Gastroenterology

## 2015-02-05 ENCOUNTER — Ambulatory Visit: Payer: Managed Care, Other (non HMO) | Admitting: Anesthesiology

## 2015-02-05 ENCOUNTER — Encounter: Payer: Self-pay | Admitting: *Deleted

## 2015-02-05 ENCOUNTER — Ambulatory Visit
Admission: RE | Admit: 2015-02-05 | Discharge: 2015-02-05 | Disposition: A | Discharge status: Home / Self Care | Payer: Managed Care, Other (non HMO) | Source: Ambulatory Visit | Attending: Gastroenterology | Admitting: Gastroenterology

## 2015-02-05 DIAGNOSIS — Z90710 Acquired absence of both cervix and uterus: Secondary | ICD-10-CM | POA: Diagnosis not present

## 2015-02-05 DIAGNOSIS — K640 First degree hemorrhoids: Secondary | ICD-10-CM | POA: Insufficient documentation

## 2015-02-05 DIAGNOSIS — K625 Hemorrhage of anus and rectum: Secondary | ICD-10-CM | POA: Diagnosis not present

## 2015-02-05 DIAGNOSIS — Z87891 Personal history of nicotine dependence: Secondary | ICD-10-CM | POA: Diagnosis not present

## 2015-02-05 DIAGNOSIS — E785 Hyperlipidemia, unspecified: Secondary | ICD-10-CM | POA: Insufficient documentation

## 2015-02-05 DIAGNOSIS — Z79899 Other long term (current) drug therapy: Secondary | ICD-10-CM | POA: Insufficient documentation

## 2015-02-05 DIAGNOSIS — K219 Gastro-esophageal reflux disease without esophagitis: Secondary | ICD-10-CM | POA: Insufficient documentation

## 2015-02-05 DIAGNOSIS — Z86010 Personal history of colonic polyps: Secondary | ICD-10-CM | POA: Diagnosis not present

## 2015-02-05 DIAGNOSIS — K644 Residual hemorrhoidal skin tags: Secondary | ICD-10-CM | POA: Insufficient documentation

## 2015-02-05 DIAGNOSIS — G4730 Sleep apnea, unspecified: Secondary | ICD-10-CM | POA: Insufficient documentation

## 2015-02-05 HISTORY — PX: COLONOSCOPY WITH PROPOFOL: SHX5780

## 2015-02-05 HISTORY — DX: Iron deficiency anemia, unspecified: D50.9

## 2015-02-05 SURGERY — COLONOSCOPY WITH PROPOFOL
Anesthesia: General

## 2015-02-05 MED ORDER — FENTANYL CITRATE (PF) 100 MCG/2ML IJ SOLN
INTRAMUSCULAR | Status: DC | PRN
  Administered 2015-02-05: 25 ug via INTRAVENOUS

## 2015-02-05 MED ORDER — FENTANYL CITRATE (PF) 100 MCG/2ML IJ SOLN: 25.0000 ug | INTRAMUSCULAR | Status: DC | PRN

## 2015-02-05 MED ORDER — ONDANSETRON HCL 4 MG/2ML IJ SOLN: 4.0000 mg | Freq: Once | INTRAMUSCULAR | Status: DC | PRN

## 2015-02-05 MED ORDER — PROPOFOL 500 MG/50ML IV EMUL
INTRAVENOUS | Status: DC | PRN
  Administered 2015-02-05: 150 ug/kg/min via INTRAVENOUS

## 2015-02-05 MED ORDER — LIDOCAINE HCL (CARDIAC) 20 MG/ML IV SOLN
INTRAVENOUS | Status: DC | PRN
  Administered 2015-02-05: 60 mg via INTRAVENOUS

## 2015-02-05 MED ORDER — SODIUM CHLORIDE 0.9 % IV SOLN
INTRAVENOUS | Status: DC
  Administered 2015-02-05: 1000 mL via INTRAVENOUS

## 2015-02-05 MED ORDER — GLYCOPYRROLATE 0.2 MG/ML IJ SOLN
INTRAMUSCULAR | Status: DC | PRN
  Administered 2015-02-05: 0.2 mg via INTRAVENOUS

## 2015-02-05 MED ORDER — PROPOFOL 10 MG/ML IV BOLUS
INTRAVENOUS | Status: DC | PRN
  Administered 2015-02-05 (×2): 50 mg via INTRAVENOUS

## 2015-02-05 MED ORDER — MIDAZOLAM HCL 2 MG/2ML IJ SOLN
INTRAMUSCULAR | Status: DC | PRN
  Administered 2015-02-05: 1 mg via INTRAVENOUS

## 2015-02-05 NOTE — Discharge Instructions (Signed)

## 2015-02-05 NOTE — Anesthesia Preprocedure Evaluation (Signed)
Anesthesia Evaluation  Patient identified by MRN, date of birth, ID band Patient awake    Reviewed: Allergy & Precautions, NPO status , Patient's Chart, lab work & pertinent test results  Airway Mallampati: III  TM Distance: <3 FB Neck ROM: Full    Dental no notable dental hx. (+) Chipped   Pulmonary sleep apnea and Continuous Positive Airway Pressure Ventilation , former smoker,  Bronchitis hx   Pulmonary exam normal        Cardiovascular negative cardio ROS Normal cardiovascular exam     Neuro/Psych negative neurological ROS  negative psych ROS   GI/Hepatic Neg liver ROS, GERD  Medicated and Controlled,Colon polyp   Endo/Other  negative endocrine ROS  Renal/GU negative Renal ROS     Musculoskeletal negative musculoskeletal ROS (+)   Abdominal Normal abdominal exam  (+)   Peds  Hematology  (+) anemia ,   Anesthesia Other Findings   Reproductive/Obstetrics                             Anesthesia Physical Anesthesia Plan  ASA: II  Anesthesia Plan: General   Post-op Pain Management:    Induction: Intravenous  Airway Management Planned: Nasal Cannula  Additional Equipment:   Intra-op Plan:   Post-operative Plan:   Informed Consent: I have reviewed the patients History and Physical, chart, labs and discussed the procedure including the risks, benefits and alternatives for the proposed anesthesia with the patient or authorized representative who has indicated his/her understanding and acceptance.   Dental advisory given  Plan Discussed with: CRNA and Surgeon  Anesthesia Plan Comments:         Anesthesia Quick Evaluation

## 2015-02-05 NOTE — Transfer of Care (Signed)
Immediate Anesthesia Transfer of Care Note  Patient: Linda Henson  Procedure(s) Performed: Procedure(s): COLONOSCOPY WITH PROPOFOL (N/A)  Patient Location: PACU  Anesthesia Type:General  Level of Consciousness: awake, alert  and oriented  Airway & Oxygen Therapy: Patient Spontanous Breathing and Patient connected to nasal cannula oxygen  Post-op Assessment: Report given to RN and Post -op Vital signs reviewed and stable  Post vital signs: stable  Last Vitals:  Filed Vitals:   02/05/15 0935 02/05/15 1037  BP: 143/90 105/68  Pulse: 67 65  Temp: 36.2 C 36.2 C  Resp: 18 17    Complications: No apparent anesthesia complications

## 2015-02-05 NOTE — H&P (Signed)
  Primary Care Physician:  Rubbie Battiest, NP  Pre-Procedure History & Physical: HPI:  Linda Henson is a 59 y.o. female is here for an colonoscopy.   Past Medical History  Diagnosis Date  . Diverticulitis   . GERD (gastroesophageal reflux disease)   . Hyperlipidemia     Borderline   . Colon polyp     Repeat colonoscopy 2018  . Sleep apnea   . Iron deficiency anemia 2014    Past Surgical History  Procedure Laterality Date  . Abdominal hysterectomy  1994  . Salpingoophorectomy Right 1994  . Eye surgery      Prior to Admission medications   Medication Sig Start Date End Date Taking? Authorizing Provider  ibuprofen (ADVIL,MOTRIN) 200 MG tablet Take 200 mg by mouth every 6 (six) hours as needed.   Yes Historical Provider, MD  fluticasone (FLONASE) 50 MCG/ACT nasal spray 2 sprays in each nostril once daily 02/22/14   Crecencio Mc, MD  pantoprazole (PROTONIX) 40 MG tablet Take 40 mg by mouth daily.    Historical Provider, MD  zolpidem (AMBIEN) 10 MG tablet Take 1 tablet (10 mg total) by mouth at bedtime as needed. for sleep 11/03/14   Rubbie Battiest, NP    Allergies as of 01/09/2015  . (No Known Allergies)    Family History  Problem Relation Age of Onset  . Heart disease Father 27    CAD - died of MI  . Hyperlipidemia Father   . Diabetes Paternal Grandmother   . Cancer Brother 41    stomach and esophageal cancer  . Hypothyroidism Sister     Social History   Social History  . Marital Status: Widowed    Spouse Name: N/A  . Number of Children: 1  . Years of Education: 14   Occupational History  . Dedicated Mining engineer   Social History Main Topics  . Smoking status: Former Smoker -- 4 years    Quit date: 12/15/1992  . Smokeless tobacco: Never Used  . Alcohol Use: 4.2 oz/week    7 Glasses of wine per week  . Drug Use: No  . Sexual Activity: Not on file   Other Topics Concern  . Not on file   Social History Narrative   Linda Henson grew up in  [place], [place]. She is widowed for 5 years. She lives at home with her 23 year old mother. 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 She enjoys playing golf.      Physical Exam: BP 143/90 mmHg  Pulse 67  Temp(Src) 97.2 F (36.2 C) (Tympanic)  Resp 18  Ht 5\' 5"  (1.651 m)  Wt 83.462 kg (184 lb)  BMI 30.62 kg/m2  SpO2 98% General:   Alert,  pleasant and cooperative in NAD Head:  Normocephalic and atraumatic. Neck:  Supple; no masses or thyromegaly. Lungs:  Clear throughout to auscultation.    Heart:  Regular rate and rhythm. Abdomen:  Soft, nontender and nondistended. Normal bowel sounds, without guarding, and without rebound.   Neurologic:  Alert and  oriented x4;  grossly normal neurologically.  Impression/Plan: Linda Henson is here for an colonoscopy to be performed for rectal bleeding  Risks, benefits, limitations, and alternatives regarding  colonoscopy have been reviewed with the patient.  Questions have been answered.  All parties agreeable.   Josefine Class, MD  02/05/2015, 9:47 AM

## 2015-02-05 NOTE — Op Note (Signed)
[facility] Patient Name: Linda Henson Procedure Date: 02/05/2015 9:49 AM MRN: [id number] Account #: [id number] Date of Birth: 27-Jan-1956 Admit Type: Outpatient Age: 59 Room: [facility] ENDO ROOM 3 Gender: Female Note Status: Finalized Procedure:         Colonoscopy Indications:       Rectal bleeding, Personal history of colonic polyps Patient Profile:   This is a 59 year old female. Providers:         Gerrit Heck. Rayann Heman, MD Medicines:         Propofol per Anesthesia Complications:     No immediate complications. Procedure:         Pre-Anesthesia Assessment:                    - Prior to the procedure, a History and Physical was                     performed, and patient medications, allergies and                     sensitivities were reviewed. The patient's tolerance of                     previous anesthesia was reviewed.                    After obtaining informed consent, the colonoscope was                     passed under direct vision. Throughout the procedure, the                     patient's blood pressure, pulse, and oxygen saturations                     were monitored continuously. The Olympus CF-H180AL                     colonoscope ( S#: [id number] ) was introduced through the                     anus and advanced to the the cecum, identified by                     appendiceal orifice and ileocecal valve. The colonoscopy                     was performed without difficulty. The patient tolerated                     the procedure well. The quality of the bowel preparation                     was excellent. Findings:      The perianal exam findings include large non-thrombosed external       hemorrhoids.      Internal hemorrhoids were found during retroflexion. The hemorrhoids       were Grade I (internal hemorrhoids that do not prolapse). Impression:        - Large non-thrombosed external hemorrhoids found on   perianal exam.                    - Internal hemorrhoids.                    -  No specimens collected. Recommendation:    - Observe patient in GI recovery unit.                    - High fiber diet.                    - Continue present medications.                    - Repeat colonoscopy in 5 years for surveillance.                    - Return to referring physician.                    - Refer to a surgeon at appointment to be scheduled for                     large external hemorrrhoids Procedure Code(s): --- Professional ---                    [phone], Colonoscopy, flexible; diagnostic, including                     collection of specimen(s) by brushing or washing, when                     performed (separate procedure) Diagnosis Code(s): --- Professional ---                    K64.0, First degree hemorrhoids                    K64.4, Residual hemorrhoidal skin tags                    K62.5, Hemorrhage of anus and rectum                    Z86.010, Personal history of colonic polyps CPT copyright 2014 American Medical Association. All rights reserved. The codes documented in this report are preliminary and upon coder review may  be revised to meet current compliance requirements. Mellody Life, MD 02/05/2015 10:34:07 AM This report has been signed electronically. Number of Addenda: 0 Note Initiated On: 02/05/2015 9:49 AM Scope Withdrawal Time: 0 hours 12 minutes 45 seconds  Total Procedure Duration: 0 hours 16 minutes 40 seconds       [facility]

## 2015-02-06 ENCOUNTER — Encounter: Payer: Self-pay | Admitting: Gastroenterology

## 2015-02-09 NOTE — Anesthesia Postprocedure Evaluation (Signed)
Anesthesia Post Note  Patient: Linda Henson  Procedure(s) Performed: Procedure(s) (LRB): COLONOSCOPY WITH PROPOFOL (N/A)  Patient location during evaluation: Endoscopy Anesthesia Type: General Level of consciousness: awake, awake and alert and oriented Pain management: pain level controlled Vital Signs Assessment: post-procedure vital signs reviewed and stable Respiratory status: spontaneous breathing Cardiovascular status: blood pressure returned to baseline Anesthetic complications: no    Last Vitals:  Filed Vitals:   02/05/15 1100 02/05/15 1110  BP: 124/82 116/75  Pulse: 55 64  Temp:    Resp: 12 14    Last Pain:  Filed Vitals:   02/06/15 0735  PainSc: 0-No pain                 Amandamarie Feggins

## 2015-02-14 ENCOUNTER — Ambulatory Visit: Payer: Self-pay | Admitting: General Surgery

## 2015-02-14 ENCOUNTER — Encounter: Payer: Self-pay | Admitting: General Surgery

## 2015-02-14 ENCOUNTER — Ambulatory Visit (INDEPENDENT_AMBULATORY_CARE_PROVIDER_SITE_OTHER): Payer: Managed Care, Other (non HMO) | Admitting: General Surgery

## 2015-02-14 VITALS — BP 138/78 | HR 76 | Resp 12 | Ht 65.0 in | Wt 193.0 lb

## 2015-02-14 DIAGNOSIS — K644 Residual hemorrhoidal skin tags: Secondary | ICD-10-CM

## 2015-02-14 DIAGNOSIS — K648 Other hemorrhoids: Secondary | ICD-10-CM | POA: Diagnosis not present

## 2015-02-14 NOTE — Progress Notes (Signed)
Patient ID: Linda Henson, female   DOB: 10/18/1955, 59 y.o.   MRN: [id number]  Chief Complaint  Patient presents with  . Other    hemorrhoids    HPI Linda Henson is a 59 y.o. female here today for a evaluation of hemorrhoids. Patient states they have been there for years. In the last two months she noticed some blood in her stool. She has had some blood in the bowl and on the paper.Her first bowel movement of the day she noticed more bleeding . Last colonoscopy was 02/05/15. Last episode of bleeding was one month ago. HPI  Past Medical History  Diagnosis Date  . Diverticulitis   . GERD (gastroesophageal reflux disease)   . Hyperlipidemia     Borderline   . Colon polyp     Repeat colonoscopy 2018  . Sleep apnea   . Iron deficiency anemia 2014    Past Surgical History  Procedure Laterality Date  . Abdominal hysterectomy  1994  . Salpingoophorectomy Right 1994  . Eye surgery    . Colonoscopy with propofol N/A 02/05/2015    Procedure: COLONOSCOPY WITH PROPOFOL;  Surgeon: Josefine Class, MD;  Location: [facility];  Service: Endoscopy;  Laterality: N/A;    Family History  Problem Relation Age of Onset  . Heart disease Father 29    CAD - died of MI  . Hyperlipidemia Father   . Diabetes Paternal Grandmother   . Cancer Brother 41    stomach and esophageal cancer  . Hypothyroidism Sister     Social History Social History  Substance Use Topics  . Smoking status: Former Smoker -- 4 years    Quit date: 12/15/1992  . Smokeless tobacco: Never Used  . Alcohol Use: 4.2 oz/week    7 Glasses of wine per week    No Known Allergies  Current Outpatient Prescriptions  Medication Sig Dispense Refill  . fluticasone (FLONASE) 50 MCG/ACT nasal spray 2 sprays in each nostril once daily 16 g 6  . ibuprofen (ADVIL,MOTRIN) 200 MG tablet Take 200 mg by mouth every 6 (six) hours as needed.    . pantoprazole (PROTONIX) 40 MG tablet Take 40 mg by mouth daily.    Marland Kitchen zolpidem  (AMBIEN) 10 MG tablet Take 1 tablet (10 mg total) by mouth at bedtime as needed. for sleep 30 tablet 5   No current facility-administered medications for this visit.    Review of Systems Review of Systems  Constitutional: Negative.   Respiratory: Negative.   Cardiovascular: Negative.     Blood pressure 138/78, pulse 76, resp. rate 12, height 5\' 5"  (1.651 m), weight 193 lb (87.544 kg).  Physical Exam Physical Exam  Constitutional: She is oriented to person, place, and time. She appears well-developed and well-nourished.  Eyes: Conjunctivae are normal. No scleral icterus.  Neck: Neck supple.  Cardiovascular: Normal rate, regular rhythm and normal heart sounds.   Pulmonary/Chest: Effort normal and breath sounds normal.  Abdominal: Soft. Bowel sounds are normal.  Genitourinary: Rectal exam shows external hemorrhoid.     Lymphadenopathy:    She has no cervical adenopathy.  Neurological: She is alert and oriented to person, place, and time.  Skin: Skin is warm and dry.    Data Reviewed Colonoscopy report of 02/05/2015 was reviewed.  CBC from 01/05/2015 showed a hemoglobin of 13.0 with an MCV of 90. White blood cell count 8800. Platelet count of 210,000.  Office notes for Tammi Klippel, [place] dated 01/05/2015 were reviewed. Past history of  heme positive stools was behind iron therapy. Prior EGD and colonoscopy in July 2012.  Assessment    Rectal bleeding from anorectal source, resolved. Normal colonoscopy.    Plan    Patient has no difficulty with perianal hygiene. No prior episodes of bleeding. No recent episode of bleeding (last episode 1 month ago) . Fairly unimpressive perianal exam.  Patient was encouraged to call should she develop recurrent bleeding for early assessment and I defecation of actual source of bleeding to better formulate a treatment plan.   Patient to return as needed.   PCP:  Norlene Duel 02/14/2015, 2:17 PM

## 2015-02-14 NOTE — Patient Instructions (Signed)
Patient to return as needed. 

## 2015-03-02 ENCOUNTER — Ambulatory Visit: Payer: Managed Care, Other (non HMO) | Admitting: Nurse Practitioner

## 2015-04-24 ENCOUNTER — Encounter: Payer: Self-pay | Admitting: Family Medicine

## 2015-04-24 ENCOUNTER — Ambulatory Visit (INDEPENDENT_AMBULATORY_CARE_PROVIDER_SITE_OTHER): Payer: BLUE CROSS/BLUE SHIELD | Admitting: Family Medicine

## 2015-04-24 VITALS — BP 138/86 | HR 84 | Temp 98.8°F | Ht 65.0 in | Wt 192.0 lb

## 2015-04-24 DIAGNOSIS — B349 Viral infection, unspecified: Secondary | ICD-10-CM

## 2015-04-24 DIAGNOSIS — J988 Other specified respiratory disorders: Principal | ICD-10-CM

## 2015-04-24 DIAGNOSIS — B9789 Other viral agents as the cause of diseases classified elsewhere: Secondary | ICD-10-CM | POA: Insufficient documentation

## 2015-04-24 MED ORDER — HYDROCOD POLST-CPM POLST ER 10-8 MG/5ML PO SUER: 5.0000 mL | Freq: Two times a day (BID) | ORAL | Status: DC | PRN

## 2015-04-24 MED ORDER — PREDNISONE 50 MG PO TABS: ORAL_TABLET | ORAL | Status: DC

## 2015-04-24 NOTE — Assessment & Plan Note (Signed)
New problem. Exam unremarkable. Viral in origin. Treating with prednisone and tussionex.

## 2015-04-24 NOTE — Patient Instructions (Signed)
This is viral in origin.  Take the prednisone and use the tussionex for cough.  This will slowly improve.  If you worsen, please let us know.  Take care  Dr. Lacinda Axon

## 2015-04-24 NOTE — Progress Notes (Signed)
Subjective:  Patient ID: Linda Henson, female    DOB: 10/08/1955  Age: 60 y.o. MRN: [id number]  CC: Head, chest congestion  HPI:  60 year old female presents to clinic today with complaints of head and chest congestion.  Patient reports that she's been sick since Friday. She been experiencing nasal/head congestion as well as sinus pressure. She's also been experiencing chest congestion and cough.  The cough is most troublesome symptom of this time. No associated fever. She does report chills. She's taken some over-the-counter medication with no relief. No known exacerbating factors. No other complaints at this time.  Social Hx   Social History   Social History  . Marital Status: Widowed    Spouse Name: N/A  . Number of Children: 1  . Years of Education: 14   Occupational History  . Dedicated Mining engineer   Social History Main Topics  . Smoking status: Former Smoker -- 4 years    Quit date: 12/15/1992  . Smokeless tobacco: Never Used  . Alcohol Use: 4.2 oz/week    7 Glasses of wine per week  . Drug Use: No  . Sexual Activity: Not Asked   Other Topics Concern  . None   Social History Narrative   Linda Henson grew up in [place], [place]. She is widowed for 5 years. She lives at home with her 63 year old mother. 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 She enjoys playing golf.    Review of Systems  Constitutional: Positive for chills. Negative for fever.  HENT: Positive for congestion and sinus pressure.   Respiratory: Positive for cough.    Objective:  BP 138/86 mmHg  Pulse 84  Temp(Src) 98.8 F (37.1 C) (Oral)  Ht 5\' 5"  (1.651 m)  Wt 192 lb (87.091 kg)  BMI 31.95 kg/m2  SpO2 97%  BP/Weight 04/24/2015 02/14/2015 [id number]  Systolic BP 0000000 [id number]  Diastolic BP 86 78 75  Wt. (Lbs) 192 193 184  BMI 31.95 32.12 30.62   Physical Exam  Constitutional: She is oriented to person, place, and time. She appears  well-developed. No distress.  HENT:  Head: Normocephalic and atraumatic.  Mouth/Throat: Oropharynx is clear and moist.  Normal TM's bilaterally.   Neck: Neck supple.  Cardiovascular: Normal rate and regular rhythm.   Pulmonary/Chest: Effort normal. No respiratory distress. She has no wheezes. She has no rales.  Neurological: She is alert and oriented to person, place, and time.  Psychiatric: She has a normal mood and affect.  Vitals reviewed.  Lab Results  Component Value Date   WBC 10.9* 02/22/2014   HGB 13.7 02/22/2014   HCT 40.0 02/22/2014   PLT 244.0 02/22/2014   GLUCOSE 104* 02/24/2014   CHOL 201* 02/24/2014   TRIG 123.0 02/24/2014   HDL 61.60 02/24/2014   LDLCALC 115* 02/24/2014   ALT 34 02/24/2014   AST 23 02/24/2014   NA 135 02/24/2014   K 4.2 02/24/2014   CL 106 02/24/2014   CREATININE 0.7 02/24/2014   BUN 16 02/24/2014   CO2 25 02/24/2014   Assessment & Plan:   Problem List Items Addressed This Visit    Viral respiratory illness - Primary    New problem. Exam unremarkable. Viral in origin. Treating with prednisone and tussionex.         Meds ordered this encounter  Medications  . predniSONE (DELTASONE) 50 MG tablet    Sig: 1 tablet daily  x 5 days.    Dispense:  5 tablet    Refill:  0  . chlorpheniramine-HYDROcodone (TUSSIONEX PENNKINETIC ER) 10-8 MG/5ML SUER    Sig: Take 5 mLs by mouth every 12 (twelve) hours as needed.    Dispense:  115 mL    Refill:  0    Follow-up: PRN  [place]

## 2015-05-20 ENCOUNTER — Other Ambulatory Visit: Payer: Self-pay | Admitting: Internal Medicine

## 2015-05-21 NOTE — Telephone Encounter (Signed)
Ok to fill 

## 2015-07-08 DIAGNOSIS — G4733 Obstructive sleep apnea (adult) (pediatric): Secondary | ICD-10-CM | POA: Diagnosis not present

## 2015-08-07 DIAGNOSIS — G4733 Obstructive sleep apnea (adult) (pediatric): Secondary | ICD-10-CM | POA: Diagnosis not present

## 2015-09-12 DIAGNOSIS — G4733 Obstructive sleep apnea (adult) (pediatric): Secondary | ICD-10-CM | POA: Diagnosis not present

## 2016-01-18 ENCOUNTER — Ambulatory Visit (INDEPENDENT_AMBULATORY_CARE_PROVIDER_SITE_OTHER): Payer: BLUE CROSS/BLUE SHIELD | Admitting: Family Medicine

## 2016-01-18 ENCOUNTER — Encounter: Payer: Self-pay | Admitting: Family Medicine

## 2016-01-18 VITALS — BP 128/94 | HR 62 | Temp 98.1°F | Resp 16 | Ht 64.5 in | Wt 181.1 lb

## 2016-01-18 DIAGNOSIS — R030 Elevated blood-pressure reading, without diagnosis of hypertension: Secondary | ICD-10-CM | POA: Diagnosis not present

## 2016-01-18 DIAGNOSIS — Z130 Encounter for screening for diseases of the blood and blood-forming organs and certain disorders involving the immune mechanism: Secondary | ICD-10-CM

## 2016-01-18 DIAGNOSIS — Z1159 Encounter for screening for other viral diseases: Secondary | ICD-10-CM

## 2016-01-18 DIAGNOSIS — E785 Hyperlipidemia, unspecified: Secondary | ICD-10-CM

## 2016-01-18 DIAGNOSIS — Z0000 Encounter for general adult medical examination without abnormal findings: Secondary | ICD-10-CM | POA: Diagnosis not present

## 2016-01-18 DIAGNOSIS — Z0001 Encounter for general adult medical examination with abnormal findings: Secondary | ICD-10-CM

## 2016-01-18 DIAGNOSIS — Z1239 Encounter for other screening for malignant neoplasm of breast: Secondary | ICD-10-CM

## 2016-01-18 DIAGNOSIS — E669 Obesity, unspecified: Secondary | ICD-10-CM | POA: Diagnosis not present

## 2016-01-18 DIAGNOSIS — Z1231 Encounter for screening mammogram for malignant neoplasm of breast: Secondary | ICD-10-CM

## 2016-01-18 LAB — CBC
HCT: 40.6 % (ref 36.0–46.0)
Hemoglobin: 14.1 g/dL (ref 12.0–15.0)
MCHC: 34.8 g/dL (ref 30.0–36.0)
MCV: 94.2 fl (ref 78.0–100.0)
PLATELETS: 198 10*3/uL (ref 150.0–400.0)
RBC: 4.3 Mil/uL (ref 3.87–5.11)
RDW: 13.3 % (ref 11.5–15.5)
WBC: 8 10*3/uL (ref 4.0–10.5)

## 2016-01-18 LAB — COMPREHENSIVE METABOLIC PANEL
ALBUMIN: 4.7 g/dL (ref 3.5–5.2)
ALT: 40 U/L — AB (ref 0–35)
AST: 25 U/L (ref 0–37)
Alkaline Phosphatase: 60 U/L (ref 39–117)
BILIRUBIN TOTAL: 0.7 mg/dL (ref 0.2–1.2)
BUN: 15 mg/dL (ref 6–23)
CALCIUM: 10.6 mg/dL — AB (ref 8.4–10.5)
CO2: 27 mEq/L (ref 19–32)
CREATININE: 0.84 mg/dL (ref 0.40–1.20)
Chloride: 103 mEq/L (ref 96–112)
GFR: 73.31 mL/min (ref >60.00)
Glucose, Bld: 89 mg/dL (ref 70–99)
Potassium: 4 mEq/L (ref 3.5–5.1)
Sodium: 140 mEq/L (ref 135–145)
Total Protein: 8.1 g/dL (ref 6.0–8.3)

## 2016-01-18 LAB — LIPID PANEL
CHOL/HDL RATIO: 3
CHOLESTEROL: 222 mg/dL — AB (ref 0–200)
HDL: 65.2 mg/dL (ref >39.00)
LDL Cholesterol: 133 mg/dL — ABNORMAL HIGH (ref 0–99)
NonHDL: 156.93
TRIGLYCERIDES: 118 mg/dL (ref 0.0–149.0)
VLDL: 23.6 mg/dL (ref 0.0–40.0)

## 2016-01-18 LAB — HEMOGLOBIN A1C: HEMOGLOBIN A1C: 4.9 % (ref 4.6–6.5)

## 2016-01-18 MED ORDER — PANTOPRAZOLE SODIUM 40 MG PO TBEC: 40.0000 mg | DELAYED_RELEASE_TABLET | Freq: Every day | ORAL | 3 refills | Status: DC

## 2016-01-18 NOTE — Patient Instructions (Signed)
Follow up annually.  Keep an eye on your BP.  Get your mammogram.  Wait on the shingles vaccine.  Follow up annually  Take care  Dr. Lacinda Axon  Health Maintenance, Female Adopting a healthy lifestyle and getting preventive care can go a long way to promote health and wellness. Talk with your health care provider about what schedule of regular examinations is right for you. This is a good chance for you to check in with your provider about disease prevention and staying healthy. In between checkups, there are plenty of things you can do on your own. Experts have done a lot of research about which lifestyle changes and preventive measures are most likely to keep you healthy. Ask your health care provider for more information. WEIGHT AND DIET  Eat a healthy diet  Be sure to include plenty of vegetables, fruits, low-fat dairy products, and lean protein.  Do not eat a lot of foods high in solid fats, added sugars, or salt.  Get regular exercise. This is one of the most important things you can do for your health.  Most adults should exercise for at least 150 minutes each week. The exercise should increase your heart rate and make you sweat (moderate-intensity exercise).  Most adults should also do strengthening exercises at least twice a week. This is in addition to the moderate-intensity exercise.  Maintain a healthy weight  Body mass index (BMI) is a measurement that can be used to identify possible weight problems. It estimates body fat based on height and weight. Your health care provider can help determine your BMI and help you achieve or maintain a healthy weight.  For females 69 years of age and older:   A BMI below 18.5 is considered underweight.  A BMI of 18.5 to 24.9 is normal.  A BMI of 25 to 29.9 is considered overweight.  A BMI of 30 and above is considered obese.  Watch levels of cholesterol and blood lipids  You should start having your blood tested for lipids and  cholesterol at 60 years of age, then have this test every 5 years.  You may need to have your cholesterol levels checked more often if:  Your lipid or cholesterol levels are high.  You are older than 60 years of age.  You are at high risk for heart disease.  CANCER SCREENING   Lung Cancer  Lung cancer screening is recommended for adults 22-24 years old who are at high risk for lung cancer because of a history of smoking.  A yearly low-dose CT scan of the lungs is recommended for people who:  Currently smoke.  Have quit within the past 15 years.  Have at least a 30-pack-year history of smoking. A pack year is smoking an average of one pack of cigarettes a day for 1 year.  Yearly screening should continue until it has been 15 years since you quit.  Yearly screening should stop if you develop a health problem that would prevent you from having lung cancer treatment.  Breast Cancer  Practice breast self-awareness. This means understanding how your breasts normally appear and feel.  It also means doing regular breast self-exams. Let your health care provider know about any changes, no matter how small.  If you are in your 20s or 30s, you should have a clinical breast exam (CBE) by a health care provider every 1-3 years as part of a regular health exam.  If you are 41 or older, have a CBE every year.  Also consider having a breast X-ray (mammogram) every year.  If you have a family history of breast cancer, talk to your health care provider about genetic screening.  If you are at high risk for breast cancer, talk to your health care provider about having an MRI and a mammogram every year.  Breast cancer gene (BRCA) assessment is recommended for women who have family members with BRCA-related cancers. BRCA-related cancers include:  Breast.  Ovarian.  Tubal.  Peritoneal cancers.  Results of the assessment will determine the need for genetic counseling and BRCA1 and BRCA2  testing. Cervical Cancer Your health care provider may recommend that you be screened regularly for cancer of the pelvic organs (ovaries, uterus, and vagina). This screening involves a pelvic examination, including checking for microscopic changes to the surface of your cervix (Pap test). You may be encouraged to have this screening done every 3 years, beginning at age 21.  For women ages 30-65, health care providers may recommend pelvic exams and Pap testing every 3 years, or they may recommend the Pap and pelvic exam, combined with testing for human papilloma virus (HPV), every 5 years. Some types of HPV increase your risk of cervical cancer. Testing for HPV may also be done on women of any age with unclear Pap test results.  Other health care providers may not recommend any screening for nonpregnant women who are considered low risk for pelvic cancer and who do not have symptoms. Ask your health care provider if a screening pelvic exam is right for you.  If you have had past treatment for cervical cancer or a condition that could lead to cancer, you need Pap tests and screening for cancer for at least 20 years after your treatment. If Pap tests have been discontinued, your risk factors (such as having a new sexual partner) need to be reassessed to determine if screening should resume. Some women have medical problems that increase the chance of getting cervical cancer. In these cases, your health care provider may recommend more frequent screening and Pap tests. Colorectal Cancer  This type of cancer can be detected and often prevented.  Routine colorectal cancer screening usually begins at 60 years of age and continues through 60 years of age.  Your health care provider may recommend screening at an earlier age if you have risk factors for colon cancer.  Your health care provider may also recommend using home test kits to check for hidden blood in the stool.  A small camera at the end of a  tube can be used to examine your colon directly (sigmoidoscopy or colonoscopy). This is done to check for the earliest forms of colorectal cancer.  Routine screening usually begins at age 50.  Direct examination of the colon should be repeated every 5-10 years through 60 years of age. However, you may need to be screened more often if early forms of precancerous polyps or small growths are found. Skin Cancer  Check your skin from head to toe regularly.  Tell your health care provider about any new moles or changes in moles, especially if there is a change in a mole's shape or color.  Also tell your health care provider if you have a mole that is larger than the size of a pencil eraser.  Always use sunscreen. Apply sunscreen liberally and repeatedly throughout the day.  Protect yourself by wearing long sleeves, pants, a wide-brimmed hat, and sunglasses whenever you are outside. HEART DISEASE, DIABETES, AND HIGH BLOOD PRESSURE     High blood pressure causes heart disease and increases the risk of stroke. High blood pressure is more likely to develop in:  People who have blood pressure in the high end of the normal range (130-139/85-89 mm Hg).  People who are overweight or obese.  People who are African American.  If you are 18-39 years of age, have your blood pressure checked every 3-5 years. If you are 40 years of age or older, have your blood pressure checked every year. You should have your blood pressure measured twice--once when you are at a hospital or clinic, and once when you are not at a hospital or clinic. Record the average of the two measurements. To check your blood pressure when you are not at a hospital or clinic, you can use:  An automated blood pressure machine at a pharmacy.  A home blood pressure monitor.  If you are between 55 years and 79 years old, ask your health care provider if you should take aspirin to prevent strokes.  Have regular diabetes screenings. This  involves taking a blood sample to check your fasting blood sugar level.  If you are at a normal weight and have a low risk for diabetes, have this test once every three years after 60 years of age.  If you are overweight and have a high risk for diabetes, consider being tested at a younger age or more often. PREVENTING INFECTION  Hepatitis B  If you have a higher risk for hepatitis B, you should be screened for this virus. You are considered at high risk for hepatitis B if:  You were born in a country where hepatitis B is common. Ask your health care provider which countries are considered high risk.  Your parents were born in a high-risk country, and you have not been immunized against hepatitis B (hepatitis B vaccine).  You have HIV or AIDS.  You use needles to inject street drugs.  You live with someone who has hepatitis B.  You have had sex with someone who has hepatitis B.  You get hemodialysis treatment.  You take certain medicines for conditions, including cancer, organ transplantation, and autoimmune conditions. Hepatitis C  Blood testing is recommended for:  Everyone born from 1945 through 1965.  Anyone with known risk factors for hepatitis C. Sexually transmitted infections (STIs)  You should be screened for sexually transmitted infections (STIs) including gonorrhea and chlamydia if:  You are sexually active and are younger than 60 years of age.  You are older than 60 years of age and your health care provider tells you that you are at risk for this type of infection.  Your sexual activity has changed since you were last screened and you are at an increased risk for chlamydia or gonorrhea. Ask your health care provider if you are at risk.  If you do not have HIV, but are at risk, it may be recommended that you take a prescription medicine daily to prevent HIV infection. This is called pre-exposure prophylaxis (PrEP). You are considered at risk if:  You are  sexually active and do not regularly use condoms or know the HIV status of your partner(s).  You take drugs by injection.  You are sexually active with a partner who has HIV. Talk with your health care provider about whether you are at high risk of being infected with HIV. If you choose to begin PrEP, you should first be tested for HIV. You should then be tested every 3 months for as   long as you are taking PrEP.  PREGNANCY   If you are premenopausal and you may become pregnant, ask your health care provider about preconception counseling.  If you may become pregnant, take 400 to 800 micrograms (mcg) of folic acid every day.  If you want to prevent pregnancy, talk to your health care provider about birth control (contraception). OSTEOPOROSIS AND MENOPAUSE   Osteoporosis is a disease in which the bones lose minerals and strength with aging. This can result in serious bone fractures. Your risk for osteoporosis can be identified using a bone density scan.  If you are 27 years of age or older, or if you are at risk for osteoporosis and fractures, ask your health care provider if you should be screened.  Ask your health care provider whether you should take a calcium or vitamin D supplement to lower your risk for osteoporosis.  Menopause may have certain physical symptoms and risks.  Hormone replacement therapy may reduce some of these symptoms and risks. Talk to your health care provider about whether hormone replacement therapy is right for you.  HOME CARE INSTRUCTIONS   Schedule regular health, dental, and eye exams.  Stay current with your immunizations.   Do not use any tobacco products including cigarettes, chewing tobacco, or electronic cigarettes.  If you are pregnant, do not drink alcohol.  If you are breastfeeding, limit how much and how often you drink alcohol.  Limit alcohol intake to no more than 1 drink per day for nonpregnant women. One drink equals 12 ounces of beer, 5  ounces of wine, or 1 ounces of hard liquor.  Do not use street drugs.  Do not share needles.  Ask your health care provider for help if you need support or information about quitting drugs.  Tell your health care provider if you often feel depressed.  Tell your health care provider if you have ever been abused or do not feel safe at home.   This information is not intended to replace advice given to you by your health care provider. Make sure you discuss any questions you have with your health care provider.   Document Released: 09/16/2010 Document Revised: 03/24/2014 Document Reviewed: 02/02/2013 Elsevier Interactive Patient Education [employer].

## 2016-01-18 NOTE — Assessment & Plan Note (Signed)
Pap smear tolerated. Mammogram ordered. Colonoscopy up-to-date. Flu vaccine up-to-date. Waiting on the vaccine for shingles. Labs today including hepatitis screening. BP elevated today. Improved on repeat. Patient is to monitor closely at home.

## 2016-01-18 NOTE — Progress Notes (Signed)
Subjective:  Patient ID: Linda Henson, female    DOB: 1956-01-13  Age: 60 y.o. MRN: [id number]  CC: Annual exam  HPI:  60 year old female with OSA, obesity, GERD, hemorrhoids presents for an annual physical exam.  Preventative Healthcare  Pap smear: No longer needed. S/p hysterectomy.   Mammogram: In need of.  Colonoscopy: Up to date.   Immunizations  Tetanus - ~ 10 years ago.  Pneumococcal - Not indicated at this time.  Flu - Up to date.  Zoster - Waiting on release of new vaccine.  Hepatitis C screening - Desires screening.  Labs: In need of labs.  Alcohol use: Yes.   Smoking/tobacco use: Former smoker.  STD/HIV testing: Declines.  Social Hx   Social History   Social History  . Marital status: Widowed    Spouse name: N/A  . Number of children: 1  . Years of education: 79   Occupational History  . Dedicated Estate agent   Social History Main Topics  . Smoking status: Former Smoker    Years: 4.00    Quit date: 12/15/1992  . Smokeless tobacco: Never Used  . Alcohol use 4.2 oz/week    7 Glasses of wine per week  . Drug use: No  . Sexual activity: Not Asked   Other Topics Concern  . None   Social History Narrative   Zamani grew up in [place], [place]. She is widowed for 5 years. She lives at home with her 56 year old mother. 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 She enjoys playing golf.    Review of Systems  Gastrointestinal: Positive for blood in stool and constipation.       Hemorrhoids.  All other systems reviewed and are negative.  Objective:  BP (!) 128/94 (BP Location: Right Arm, Patient Position: Sitting, Cuff Size: Normal)   Pulse 62   Temp 98.1 F (36.7 C) (Oral)   Resp 16   Ht 5' 4.5" (1.638 m)   Wt 181 lb 2 oz (82.2 kg)   SpO2 98%   BMI 30.61 kg/m   BP/Weight 01/18/2016 04/24/2015 77/93/9030  Systolic BP 092 330 076  Diastolic BP 94 86 78  Wt. (Lbs)  181.13 192 193  BMI 30.61 31.95 32.12   Physical Exam  Constitutional: She is oriented to person, place, and time. She appears well-developed and well-nourished. No distress.  HENT:  Head: Normocephalic and atraumatic.  Nose: Nose normal.  Mouth/Throat: Oropharynx is clear and moist. No oropharyngeal exudate.  Normal TM's bilaterally.   Eyes: Conjunctivae are normal. No scleral icterus.  Neck: Neck supple. No thyromegaly present.  Cardiovascular: Normal rate and regular rhythm.   No murmur heard. Pulmonary/Chest: Effort normal and breath sounds normal. She has no wheezes. She has no rales.  Abdominal: Soft. She exhibits no distension. There is no tenderness. There is no rebound and no guarding.  Musculoskeletal: Normal range of motion. She exhibits no edema.  Lymphadenopathy:    She has no cervical adenopathy.  Neurological: She is alert and oriented to person, place, and time.  Skin: Skin is warm and dry. No rash noted.  Psychiatric: She has a normal mood and affect.  Vitals reviewed.  Lab Results  Component Value Date   WBC 10.9 (H) 02/22/2014   HGB 13.7 02/22/2014   HCT 40.0 02/22/2014   PLT 244.0 02/22/2014   GLUCOSE 104 (H) 02/24/2014   CHOL 201 (H) 02/24/2014  TRIG 123.0 02/24/2014   HDL 61.60 02/24/2014   LDLCALC 115 (H) 02/24/2014   ALT 34 02/24/2014   AST 23 02/24/2014   NA 135 02/24/2014   K 4.2 02/24/2014   CL 106 02/24/2014   CREATININE 0.7 02/24/2014   BUN 16 02/24/2014   CO2 25 02/24/2014   Assessment & Plan:   Problem List Items Addressed This Visit    Annual visit for general adult medical examination with abnormal findings - Primary    Pap smear tolerated. Mammogram ordered. Colonoscopy up-to-date. Flu vaccine up-to-date. Waiting on the vaccine for shingles. Labs today including hepatitis screening. BP elevated today. Improved on repeat. Patient is to monitor closely at home.       Other Visit Diagnoses    Screening for deficiency anemia        Relevant Orders   CBC   Blood pressure elevated without history of HTN       Relevant Orders   Comp Met (CMET)   Hyperlipidemia, unspecified hyperlipidemia type       Relevant Orders   Lipid panel   Obesity (BMI 30.0-34.9)       Relevant Orders   HgB A1c   Need for hepatitis C screening test       Relevant Orders   Hepatitis C Antibody   Breast cancer screening       Relevant Orders   MM Digital Screening     Meds ordered this encounter  Medications  . pantoprazole (PROTONIX) 40 MG tablet    Sig: Take 1 tablet (40 mg total) by mouth daily.    Dispense:  90 tablet    Refill:  3   Follow-up: Annually  [facility]

## 2016-01-18 NOTE — Progress Notes (Signed)
Pre visit review using our clinic review tool, if applicable. No additional management support is needed unless otherwise documented below in the visit note. 

## 2016-01-19 LAB — HEPATITIS C ANTIBODY: HCV AB: REACTIVE — AB

## 2016-01-21 ENCOUNTER — Encounter: Payer: Self-pay | Admitting: Family Medicine

## 2016-01-21 DIAGNOSIS — E785 Hyperlipidemia, unspecified: Secondary | ICD-10-CM | POA: Insufficient documentation

## 2016-01-23 LAB — HEPATITIS C RNA QUANTITATIVE
HCV QUANT LOG: 5.89 {Log} — AB (ref <1.18)
HCV Quantitative: 773063 IU/mL — ABNORMAL HIGH (ref <15)

## 2016-01-24 ENCOUNTER — Other Ambulatory Visit: Payer: Self-pay | Admitting: Family Medicine

## 2016-01-24 DIAGNOSIS — Z1231 Encounter for screening mammogram for malignant neoplasm of breast: Secondary | ICD-10-CM

## 2016-01-25 ENCOUNTER — Other Ambulatory Visit: Payer: Self-pay | Admitting: Family Medicine

## 2016-01-25 DIAGNOSIS — B182 Chronic viral hepatitis C: Secondary | ICD-10-CM

## 2016-01-31 ENCOUNTER — Telehealth: Payer: Self-pay

## 2016-01-31 NOTE — Telephone Encounter (Signed)
Pt is on lab scheduled for 02/01/16. No future orders placed. Looks like maybe re-checking cmp? Please review and order if appropriate.

## 2016-02-01 ENCOUNTER — Ambulatory Visit
Admission: RE | Admit: 2016-02-01 | Discharge: 2016-02-01 | Disposition: A | Discharge status: Home / Self Care | Payer: BLUE CROSS/BLUE SHIELD | Source: Ambulatory Visit | Attending: Family Medicine | Admitting: Family Medicine

## 2016-02-01 ENCOUNTER — Other Ambulatory Visit (INDEPENDENT_AMBULATORY_CARE_PROVIDER_SITE_OTHER): Payer: BLUE CROSS/BLUE SHIELD

## 2016-02-01 ENCOUNTER — Other Ambulatory Visit: Payer: Self-pay | Admitting: Family Medicine

## 2016-02-01 ENCOUNTER — Ambulatory Visit: Admission: RE | Admit: 2016-02-01 | Payer: BLUE CROSS/BLUE SHIELD | Source: Ambulatory Visit

## 2016-02-01 DIAGNOSIS — R945 Abnormal results of liver function studies: Principal | ICD-10-CM

## 2016-02-01 DIAGNOSIS — Z1231 Encounter for screening mammogram for malignant neoplasm of breast: Secondary | ICD-10-CM | POA: Diagnosis not present

## 2016-02-01 DIAGNOSIS — R7989 Other specified abnormal findings of blood chemistry: Secondary | ICD-10-CM | POA: Diagnosis not present

## 2016-02-01 LAB — HEPATIC FUNCTION PANEL
ALK PHOS: 56 U/L (ref 39–117)
ALT: 30 U/L (ref 0–35)
AST: 20 U/L (ref 0–37)
Albumin: 4.5 g/dL (ref 3.5–5.2)
BILIRUBIN DIRECT: 0.1 mg/dL (ref 0.0–0.3)
TOTAL PROTEIN: 7.7 g/dL (ref 6.0–8.3)
Total Bilirubin: 0.8 mg/dL (ref 0.2–1.2)

## 2016-03-07 DIAGNOSIS — B182 Chronic viral hepatitis C: Secondary | ICD-10-CM | POA: Diagnosis not present

## 2016-03-17 DIAGNOSIS — B1920 Unspecified viral hepatitis C without hepatic coma: Secondary | ICD-10-CM

## 2016-03-17 HISTORY — DX: Unspecified viral hepatitis C without hepatic coma: B19.20

## 2016-03-26 DIAGNOSIS — G4733 Obstructive sleep apnea (adult) (pediatric): Secondary | ICD-10-CM | POA: Diagnosis not present

## 2016-04-18 DIAGNOSIS — D2261 Melanocytic nevi of right upper limb, including shoulder: Secondary | ICD-10-CM | POA: Diagnosis not present

## 2016-06-05 DIAGNOSIS — B182 Chronic viral hepatitis C: Secondary | ICD-10-CM | POA: Diagnosis not present

## 2016-06-27 DIAGNOSIS — G4733 Obstructive sleep apnea (adult) (pediatric): Secondary | ICD-10-CM | POA: Diagnosis not present

## 2016-07-01 DIAGNOSIS — B182 Chronic viral hepatitis C: Secondary | ICD-10-CM | POA: Diagnosis not present

## 2016-08-18 ENCOUNTER — Telehealth: Payer: Self-pay | Admitting: *Deleted

## 2016-08-18 DIAGNOSIS — Z87891 Personal history of nicotine dependence: Secondary | ICD-10-CM

## 2016-08-18 NOTE — Telephone Encounter (Signed)
Contacted by patient and obtained smoking history,(current, 52 pack year) as well as answering questions related to screening process. Patient denies signs of lung cancer such as weight loss or hemoptysis. Patient denies comorbidity that would prevent curative treatment if lung cancer were found. Patient is scheduled for shared decision making visit and CT scan on 09/02/16.

## 2016-09-02 ENCOUNTER — Inpatient Hospital Stay: Payer: BLUE CROSS/BLUE SHIELD | Attending: Oncology | Admitting: Oncology

## 2016-09-02 ENCOUNTER — Ambulatory Visit
Admission: RE | Admit: 2016-09-02 | Discharge: 2016-09-02 | Disposition: A | Discharge status: Home / Self Care | Payer: BLUE CROSS/BLUE SHIELD | Source: Ambulatory Visit | Attending: Oncology | Admitting: Oncology

## 2016-09-02 DIAGNOSIS — Z122 Encounter for screening for malignant neoplasm of respiratory organs: Secondary | ICD-10-CM

## 2016-09-02 DIAGNOSIS — J690 Pneumonitis due to inhalation of food and vomit: Secondary | ICD-10-CM | POA: Diagnosis not present

## 2016-09-02 DIAGNOSIS — K449 Diaphragmatic hernia without obstruction or gangrene: Secondary | ICD-10-CM | POA: Insufficient documentation

## 2016-09-02 DIAGNOSIS — B182 Chronic viral hepatitis C: Secondary | ICD-10-CM | POA: Diagnosis not present

## 2016-09-02 DIAGNOSIS — Z87891 Personal history of nicotine dependence: Secondary | ICD-10-CM

## 2016-09-03 DIAGNOSIS — Z87891 Personal history of nicotine dependence: Secondary | ICD-10-CM | POA: Insufficient documentation

## 2016-09-03 NOTE — Progress Notes (Signed)
In accordance with CMS guidelines, patient has met eligibility criteria including age, absence of signs or symptoms of lung cancer.  Social History  Substance Use Topics  . Smoking status: Former Smoker    Years: 4.00    Quit date: 12/15/1992  . Smokeless tobacco: Never Used  . Alcohol use 4.2 oz/week    7 Glasses of wine per week     A shared decision-making session was conducted prior to the performance of CT scan. This includes one or more decision aids, includes benefits and harms of screening, follow-up diagnostic testing, over-diagnosis, false positive rate, and total radiation exposure.  Counseling on the importance of adherence to annual lung cancer LDCT screening, impact of co-morbidities, and ability or willingness to undergo diagnosis and treatment is imperative for compliance of the program.  Counseling on the importance of continued smoking cessation for former smokers; the importance of smoking cessation for current smokers, and information about tobacco cessation interventions have been given to patient including [facility] and 1800 quit [place] programs.  Written order for lung cancer screening with LDCT has been given to the patient and any and all questions have been answered to the best of my abilities.   Yearly follow up will be coordinated by Burgess Estelle, Thoracic Navigator.

## 2016-09-04 ENCOUNTER — Encounter: Payer: Self-pay | Admitting: *Deleted

## 2016-09-15 DIAGNOSIS — B182 Chronic viral hepatitis C: Secondary | ICD-10-CM | POA: Diagnosis not present

## 2016-10-03 DIAGNOSIS — G4733 Obstructive sleep apnea (adult) (pediatric): Secondary | ICD-10-CM | POA: Diagnosis not present

## 2016-12-01 DIAGNOSIS — G4733 Obstructive sleep apnea (adult) (pediatric): Secondary | ICD-10-CM | POA: Diagnosis not present

## 2017-01-15 ENCOUNTER — Ambulatory Visit (INDEPENDENT_AMBULATORY_CARE_PROVIDER_SITE_OTHER): Payer: BLUE CROSS/BLUE SHIELD | Admitting: Family Medicine

## 2017-01-15 ENCOUNTER — Encounter: Payer: Self-pay | Admitting: Family Medicine

## 2017-01-15 VITALS — BP 130/90 | HR 62 | Temp 98.4°F | Resp 12 | Ht 65.0 in | Wt 198.5 lb

## 2017-01-15 DIAGNOSIS — R0600 Dyspnea, unspecified: Secondary | ICD-10-CM

## 2017-01-15 DIAGNOSIS — E669 Obesity, unspecified: Secondary | ICD-10-CM | POA: Diagnosis not present

## 2017-01-15 DIAGNOSIS — R200 Anesthesia of skin: Secondary | ICD-10-CM | POA: Insufficient documentation

## 2017-01-15 DIAGNOSIS — Z13220 Encounter for screening for lipoid disorders: Secondary | ICD-10-CM

## 2017-01-15 DIAGNOSIS — Z1231 Encounter for screening mammogram for malignant neoplasm of breast: Secondary | ICD-10-CM

## 2017-01-15 DIAGNOSIS — Z0001 Encounter for general adult medical examination with abnormal findings: Secondary | ICD-10-CM

## 2017-01-15 DIAGNOSIS — K644 Residual hemorrhoidal skin tags: Secondary | ICD-10-CM | POA: Diagnosis not present

## 2017-01-15 DIAGNOSIS — R0609 Other forms of dyspnea: Secondary | ICD-10-CM | POA: Diagnosis not present

## 2017-01-15 DIAGNOSIS — F320 Major depressive disorder, single episode, mild: Secondary | ICD-10-CM | POA: Diagnosis not present

## 2017-01-15 DIAGNOSIS — Z1239 Encounter for other screening for malignant neoplasm of breast: Secondary | ICD-10-CM

## 2017-01-15 DIAGNOSIS — J8489 Other specified interstitial pulmonary diseases: Secondary | ICD-10-CM

## 2017-01-15 DIAGNOSIS — F419 Anxiety disorder, unspecified: Secondary | ICD-10-CM | POA: Insufficient documentation

## 2017-01-15 DIAGNOSIS — F39 Unspecified mood [affective] disorder: Secondary | ICD-10-CM | POA: Insufficient documentation

## 2017-01-15 NOTE — Assessment & Plan Note (Signed)
Positional numbness in left arm when sleeping. Resolves quickly with change in position. Neurologically intact. Suspect nerve impingement while asleep. If she develops new symptoms or more persistent symptoms she'll be reevaluated.

## 2017-01-15 NOTE — Assessment & Plan Note (Signed)
Patient reports recent dyspnea on exertion. Suspect related to pulmonary findings from her CT scan. EKG reassuring. We'll check lab work. Will refer to pulmonology.

## 2017-01-15 NOTE — Patient Instructions (Signed)
Nice to meet you. We will get you to see pulmonology. Please monitor your breathing and if it worsens significantly please be evaluated. Please contact the breast center for your mammogram. Please return tomorrow as scheduled for lab work. Please try to decrease your alcohol intake by 1 drink daily.

## 2017-01-15 NOTE — Assessment & Plan Note (Signed)
Very mild. She does not want medication. She'll continue to monitor.

## 2017-01-15 NOTE — Progress Notes (Signed)
Linda Rumps, MD Phone: [phone]  Linda Henson is a 61 y.o. female who presents today for physical exam.  Does exercise by walking for 40 minutes daily. Has tried intermittent dieting over the years. Has not been sticking with it. Mammogram due. Colonoscopy up-to-date. Has internal and external hemorrhoids. Has bleeding every 2-3 months with straining. No polyps on last colonoscopy. She did see a surgeon for her hemorrhoids and states they advised to monitor for now. Has been treated for hepatitis C. Flu shot up-to-date. Due for tetanus vaccination. Former smoker. Had lung cancer screening with CT scan revealing possible interstitial pneumonitis. She does report dyspnea on exertion that is somewhat new over the last several months. Occurs when she walks. No chest pain. Does cough some. Drinks a fifth of liquor per week. No illicit drug use. History of hysterectomy for endometriosis. They took her cervix out. She states that GYN advised that she did not need Pap smears. She does report occasional slight depression. No SI. She does not want medication. Patient does note occasional left arm numbness only when she is sleeping. She sleeps with her arm up near her head and when she wakes up there is slight numbness to it. She will then move it around and it goes away. No numbness at other times. No weakness.   Active Ambulatory Problems    Diagnosis Date Noted  . Encounter for general adult medical examination with abnormal findings 01/15/2013  . S/P hysterectomy 02/22/2014  . S/P LASIK surgery of both eyes 02/22/2014  . Obesity 02/23/2014  . OSA on CPAP 11/12/2014  . GERD (gastroesophageal reflux disease) 11/12/2014  . External hemorrhoids 02/14/2015  . Hyperlipidemia 01/21/2016  . Personal history of tobacco use, presenting hazards to health 09/03/2016  . Dyspnea on exertion 01/15/2017  . Positional numbness 01/15/2017  . Depression, major, single episode, mild ([place])  01/15/2017   Resolved Ambulatory Problems    Diagnosis Date Noted  . Screening for breast cancer 01/15/2013  . Sleep disturbance 01/15/2013  . Acute bronchitis 02/23/2014  . Bright red blood per rectum 02/23/2014  . LUQ pain 11/12/2014  . Viral respiratory illness 04/24/2015   Past Medical History:  Diagnosis Date  . Colon polyp   . Diverticulitis   . GERD (gastroesophageal reflux disease)   . Hyperlipidemia   . Iron deficiency anemia 2014  . Sleep apnea     Family History  Problem Relation Age of Onset  . Heart disease Father 42       CAD - died of MI  . Hyperlipidemia Father   . Cancer Brother 41       stomach and esophageal cancer  . Hypothyroidism Sister   . Diabetes Paternal Grandmother     Social History   Social History  . Marital status: Widowed    Spouse name: N/A  . Number of children: 1  . Years of education: 100   Occupational History  . Dedicated Estate agent   Social History Main Topics  . Smoking status: Former Smoker    Years: 4.00    Quit date: 12/15/1992  . Smokeless tobacco: Never Used  . Alcohol use 4.2 oz/week    7 Glasses of wine per week  . Drug use: No  . Sexual activity: Not on file   Other Topics Concern  . Not on file   Social History Narrative   Garry grew up in [place], [place]. She is widowed for 5 years. She lives at  home with her 56 year old mother. 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 She enjoys playing golf.     ROS  General:  Negative for nexplained weight loss, fever Skin: Negative for new or changing mole, sore that won't heal HEENT: Negative for trouble hearing, trouble seeing, ringing in ears, mouth sores, hoarseness, change in voice, dysphagia. CV:  Negative for chest pain, edema, palpitations Resp: Positive for cough, dyspnea, negative for hemoptysis GI: Positive for hematochezia, Negative for nausea, vomiting, diarrhea, constipation,  abdominal pain, melena. GU: Positive for stress incontinence, Negative for dysuria, urinary hesitance, hematuria, vaginal or penile discharge, polyuria, sexual difficulty, lumps in testicle or breasts MSK: Negative for muscle cramps or aches, joint pain or swelling Neuro: Positive for numbness, Negative for headaches, weakness, dizziness, passing out/fainting Psych: Positive for depression, negative for anxiety, memory problems  Objective  Physical Exam Vitals:   01/15/17 1122  BP: 130/90  Pulse: 62  Resp: 12  Temp: 98.4 F (36.9 C)  SpO2: 98%    BP Readings from Last 3 Encounters:  01/15/17 130/90  01/18/16 (!) 128/94  04/24/15 138/86   Wt Readings from Last 3 Encounters:  01/15/17 198 lb 8 oz (90 kg)  09/02/16 181 lb (82.1 kg)  01/18/16 181 lb 2 oz (82.2 kg)    Physical Exam  Constitutional: No distress.  HENT:  Head: Normocephalic and atraumatic.  Mouth/Throat: Oropharynx is clear and moist. No oropharyngeal exudate.  Eyes: Pupils are equal, round, and reactive to light. Conjunctivae are normal.  Cardiovascular: Normal rate, regular rhythm and normal heart sounds.   Pulmonary/Chest: Effort normal and breath sounds normal.  Abdominal: Soft. Bowel sounds are normal. She exhibits no distension. There is no tenderness. There is no rebound and no guarding.  Genitourinary:  Genitourinary Comments: Bilateral breasts with no masses, tenderness, skin changes, or nipple inversion, no axillary masses bilaterally  Musculoskeletal: She exhibits no edema.  Neurological: She is alert. Gait normal.  CN 2-12 intact, 5/5 strength in bilateral biceps, triceps, grip, quads, hamstrings, plantar and dorsiflexion, sensation to light touch intact in bilateral UE and LE, normal gait, 2+ patellar reflexes  Skin: Skin is warm and dry. She is not diaphoretic.  Psychiatric: Affect normal.   EKG: Normal sinus rhythm, rate 61, no ischemic changes noted.  Assessment/Plan:   Encounter for  general adult medical examination with abnormal findings Physical exam completed. Breast exam completed. Mammogram ordered. Pelvic exam deferred given history of hysterectomy. Encouraged decreased alcohol intake. Lab work ordered as outlined below. Encouraged diet and exercise.  Dyspnea on exertion Patient reports recent dyspnea on exertion. Suspect related to pulmonary findings from her CT scan. EKG reassuring. We'll check lab work. Will refer to pulmonology.  External hemorrhoids Intermittent hematochezia. Only with straining. Most recent colonoscopy with no polyps. Suspect hemorrhoidal related. She'll monitor and if worsens be reevaluated.  Positional numbness Positional numbness in left arm when sleeping. Resolves quickly with change in position. Neurologically intact. Suspect nerve impingement while asleep. If she develops new symptoms or more persistent symptoms she'll be reevaluated.  Depression, major, single episode, mild ([facility]) Very mild. She does not want medication. She'll continue to monitor.   Orders Placed This Encounter  Procedures  . MM SCREENING BREAST TOMO BILATERAL    Standing Status:   Future    Standing Expiration Date:   01/15/2018    Order Specific Question:   Reason for Exam (SYMPTOM  OR DIAGNOSIS REQUIRED)  Answer:   breast cancer screening    Order Specific Question:   Preferred imaging location?    Answer:   [facility]  . CBC    Standing Status:   Future    Standing Expiration Date:   01/15/2018  . Comp Met (CMET)    Standing Status:   Future    Standing Expiration Date:   01/15/2018  . TSH    Standing Status:   Future    Standing Expiration Date:   01/15/2018  . Lipid Profile    Standing Status:   Future    Standing Expiration Date:   01/15/2018  . HgB A1c    Standing Status:   Future    Standing Expiration Date:   01/15/2018  . Ambulatory referral to Pulmonology    Referral Priority:   Routine    Referral Type:   Consultation    Referral  Reason:   Specialty Services Required    Requested Specialty:   Pulmonary Disease    Number of Visits Requested:   1  . EKG 12-Lead    No orders of the defined types were placed in this encounter.    Linda Rumps, MD [place]

## 2017-01-15 NOTE — Assessment & Plan Note (Signed)
>>  ASSESSMENT AND PLAN FOR ANXIETY AND DEPRESSION WRITTEN ON 01/15/2017 12:31 PM BY SONNENBERG, ERIC G, MD  Very mild. She does not want medication. She'll continue to monitor.

## 2017-01-15 NOTE — Assessment & Plan Note (Signed)
Physical exam completed. Breast exam completed. Mammogram ordered. Pelvic exam deferred given history of hysterectomy. Encouraged decreased alcohol intake. Lab work ordered as outlined below. Encouraged diet and exercise.

## 2017-01-15 NOTE — Assessment & Plan Note (Signed)
Intermittent hematochezia. Only with straining. Most recent colonoscopy with no polyps. Suspect hemorrhoidal related. She'll monitor and if worsens be reevaluated.

## 2017-01-16 ENCOUNTER — Other Ambulatory Visit (INDEPENDENT_AMBULATORY_CARE_PROVIDER_SITE_OTHER): Payer: BLUE CROSS/BLUE SHIELD

## 2017-01-16 DIAGNOSIS — Z13220 Encounter for screening for lipoid disorders: Secondary | ICD-10-CM

## 2017-01-16 DIAGNOSIS — E669 Obesity, unspecified: Secondary | ICD-10-CM

## 2017-01-16 DIAGNOSIS — H5340 Unspecified visual field defects: Secondary | ICD-10-CM | POA: Diagnosis not present

## 2017-01-16 DIAGNOSIS — R0609 Other forms of dyspnea: Secondary | ICD-10-CM | POA: Diagnosis not present

## 2017-01-16 DIAGNOSIS — R0600 Dyspnea, unspecified: Secondary | ICD-10-CM

## 2017-01-16 LAB — CBC
HCT: 40.8 % (ref 36.0–46.0)
Hemoglobin: 14 g/dL (ref 12.0–15.0)
MCHC: 34.4 g/dL (ref 30.0–36.0)
MCV: 97.7 fl (ref 78.0–100.0)
PLATELETS: 192 10*3/uL (ref 150.0–400.0)
RBC: 4.17 Mil/uL (ref 3.87–5.11)
RDW: 13.5 % (ref 11.5–15.5)
WBC: 8.5 10*3/uL (ref 4.0–10.5)

## 2017-01-16 LAB — LIPID PANEL
CHOLESTEROL: 228 mg/dL — AB (ref 0–200)
HDL: 54.9 mg/dL (ref >39.00)
LDL CALC: 134 mg/dL — AB (ref 0–99)
NONHDL: 172.65
Total CHOL/HDL Ratio: 4
Triglycerides: 193 mg/dL — ABNORMAL HIGH (ref 0.0–149.0)
VLDL: 38.6 mg/dL (ref 0.0–40.0)

## 2017-01-16 LAB — COMPREHENSIVE METABOLIC PANEL
ALBUMIN: 4.3 g/dL (ref 3.5–5.2)
ALT: 19 U/L (ref 0–35)
AST: 16 U/L (ref 0–37)
Alkaline Phosphatase: 53 U/L (ref 39–117)
BILIRUBIN TOTAL: 0.6 mg/dL (ref 0.2–1.2)
BUN: 13 mg/dL (ref 6–23)
CALCIUM: 9.4 mg/dL (ref 8.4–10.5)
CHLORIDE: 104 meq/L (ref 96–112)
CO2: 28 mEq/L (ref 19–32)
CREATININE: 0.79 mg/dL (ref 0.40–1.20)
GFR: 78.43 mL/min (ref >60.00)
Glucose, Bld: 97 mg/dL (ref 70–99)
Potassium: 3.9 mEq/L (ref 3.5–5.1)
SODIUM: 139 meq/L (ref 135–145)
Total Protein: 7.7 g/dL (ref 6.0–8.3)

## 2017-01-16 LAB — HEMOGLOBIN A1C: HEMOGLOBIN A1C: 5.2 % (ref 4.6–6.5)

## 2017-01-16 LAB — TSH: TSH: 1.52 u[IU]/mL (ref 0.35–4.50)

## 2017-01-16 NOTE — Progress Notes (Signed)
The 10-year ASCVD risk score Mikey Bussing DC Brooke Bonito., et al., 2013) is: 4.1%   Values used to calculate the score:     Age: 61 years     Sex: Female     Is Non-Hispanic African American: No     Diabetic: No     Tobacco smoker: No     Systolic Blood Pressure: 468 mmHg     Is BP treated: No     HDL Cholesterol: 54.9 mg/dL     Total Cholesterol: 228 mg/dL

## 2017-01-23 ENCOUNTER — Other Ambulatory Visit
Admission: RE | Admit: 2017-01-23 | Discharge: 2017-01-23 | Disposition: A | Discharge status: Home / Self Care | Payer: BLUE CROSS/BLUE SHIELD | Source: Ambulatory Visit | Attending: Pulmonary Disease | Admitting: Pulmonary Disease

## 2017-01-23 ENCOUNTER — Encounter: Payer: Self-pay | Admitting: Pulmonary Disease

## 2017-01-23 ENCOUNTER — Ambulatory Visit (INDEPENDENT_AMBULATORY_CARE_PROVIDER_SITE_OTHER): Payer: BLUE CROSS/BLUE SHIELD | Admitting: Pulmonary Disease

## 2017-01-23 VITALS — BP 144/90 | HR 68 | Ht 65.0 in | Wt 201.0 lb

## 2017-01-23 DIAGNOSIS — R0609 Other forms of dyspnea: Secondary | ICD-10-CM | POA: Insufficient documentation

## 2017-01-23 DIAGNOSIS — H5340 Unspecified visual field defects: Secondary | ICD-10-CM | POA: Diagnosis not present

## 2017-01-23 DIAGNOSIS — Z87891 Personal history of nicotine dependence: Secondary | ICD-10-CM | POA: Diagnosis not present

## 2017-01-23 DIAGNOSIS — R0600 Dyspnea, unspecified: Secondary | ICD-10-CM

## 2017-01-23 DIAGNOSIS — R9389 Abnormal findings on diagnostic imaging of other specified body structures: Secondary | ICD-10-CM | POA: Diagnosis not present

## 2017-01-23 DIAGNOSIS — R635 Abnormal weight gain: Secondary | ICD-10-CM | POA: Diagnosis not present

## 2017-01-23 LAB — BRAIN NATRIURETIC PEPTIDE: B NATRIURETIC PEPTIDE 5: 24 pg/mL (ref 0.0–100.0)

## 2017-01-23 NOTE — Patient Instructions (Signed)
The CT scan finding are minimal or mild.  We agreed that they do not need to be followed up with any further imaging at this time.  Continue on the annual screening with low-dose CT scan of the chest.  For shortness of breath, I have ordered a blood test (B natruretic peptide) and pulmonary function tests  Follow-up in 4-6 weeks

## 2017-01-23 NOTE — Progress Notes (Signed)
PULMONARY CONSULT NOTE  Requesting MD/Service: Caryl Bis Date of initial consultation: 01/23/17 Reason for consultation: pulmonary fibrosis  PT PROFILE: 61 y.o. female former smoker (quit 1994) referred for evaluation of   DATA: 09/02/16 LDCT: very mild scattered subpleural reticulation, likely superimposed on trace paraseptal emphysema. No worrisome pulmonary nodules  HPI:  As above.  She is referred specifically due to the findings on the LD CT.  However, she notes mildly progressive dyspnea over the past year or so.  She attributes this to a weight gain of 15 pounds in the past 6 months.  She is a former smoker, quit in 1994.  However, she also had significant exposure to secondhand smoke.  Her exertional dyspnea is mild.  It does not prevent her from doing daily activities or limit her in any way.  She denies cough, hemoptysis, chest pain, lower extremity edema.  However, she does report orthopnea.  Past Medical History:  Diagnosis Date  . Colon polyp    Repeat colonoscopy 2018  . Diverticulitis   . GERD (gastroesophageal reflux disease)   . Hyperlipidemia    Borderline   . Iron deficiency anemia 2014  . Sleep apnea     Past Surgical History:  Procedure Laterality Date  . ABDOMINAL HYSTERECTOMY  1994  . EYE SURGERY    . SALPINGOOPHORECTOMY Right 1994    MEDICATIONS: I have reviewed all medications and confirmed regimen as documented  Social History   Socioeconomic History  . Marital status: Widowed    Spouse name: Not on file  . Number of children: 1  . Years of education: 69  . Highest education level: Not on file  Social Needs  . Financial resource strain: Not on file  . Food insecurity - worry: Not on file  . Food insecurity - inability: Not on file  . Transportation needs - medical: Not on file  . Transportation needs - non-medical: Not on file  Occupational History  . Occupation: Dedicated Hydrologist: [employer]    Comment: [employer]   Tobacco Use  . Smoking status: Former Smoker    Years: 4.00    Last attempt to quit: 12/15/1992    Years since quitting: 24.1  . Smokeless tobacco: Never Used  Substance and Sexual Activity  . Alcohol use: Yes    Alcohol/week: 4.2 oz    Types: 7 Glasses of wine per week  . Drug use: No  . Sexual activity: Not on file  Other Topics Concern  . Not on file  Social History Narrative   Linda Henson grew up in [place], [place]. She is widowed for 5 years. She lives at home with her 60 year old mother. 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 She enjoys playing golf.     Family History  Problem Relation Age of Onset  . Heart disease Father 30       CAD - died of MI  . Hyperlipidemia Father   . Cancer Brother 41       stomach and esophageal cancer  . Hypothyroidism Sister   . Diabetes Paternal Grandmother     ROS: No fever, myalgias/arthralgias, unexplained weight loss or weight gain No new focal weakness or sensory deficits No otalgia, hearing loss, visual changes, nasal and sinus symptoms, mouth and throat problems No neck pain or adenopathy No abdominal pain, N/V/D, diarrhea, change in bowel pattern No dysuria, change in urinary pattern   Vitals:   01/23/17  1054 01/23/17 1057  BP:  (!) 144/90  Pulse:  68  SpO2:  94%  Weight: 91.2 kg (201 lb)   Height: 5\' 5"  (1.651 m)    Room air  EXAM:  Gen: Mildly obese, No overt respiratory distress HEENT: NCAT, sclera white, oropharynx normal Neck: Supple without LAN, thyromegaly, JVD Lungs: breath sounds full without adventitious sounds Cardiovascular: RRR, no murmurs noted Abdomen: Soft, nontender, normal BS Ext: without clubbing, cyanosis, edema Neuro: CNs grossly intact, motor and sensory intact Skin: Limited exam, no lesions noted  DATA:   BMP Latest Ref Rng & Units 01/16/2017 01/18/2016 02/24/2014  Glucose 70 - 99 mg/dL 97 89 104(H)  BUN 6 - 23 mg/dL 13 15 16   Creatinine 0.40 -  1.20 mg/dL 0.79 0.84 0.7  Sodium 135 - 145 mEq/L 139 140 135  Potassium 3.5 - 5.1 mEq/L 3.9 4.0 4.2  Chloride 96 - 112 mEq/L 104 103 106  CO2 19 - 32 mEq/L 28 27 25   Calcium 8.4 - 10.5 mg/dL 9.4 10.6(H) 9.0    CBC Latest Ref Rng & Units 01/16/2017 01/18/2016 02/22/2014  WBC 4.0 - 10.5 K/uL 8.5 8.0 10.9(H)  Hemoglobin 12.0 - 15.0 g/dL 14.0 14.1 13.7  Hematocrit 36.0 - 46.0 % 40.8 40.6 40.0  Platelets 150.0 - 400.0 K/uL 192.0 198.0 244.0    CXR: No recent film  IMPRESSION:     ICD-10-CM   1. DOE (dyspnea on exertion) R06.09 Brain natriuretic peptide    Pulmonary Function Test [facility] Only  2. Weight gain R63.5   3. Abnormal CT of the chest R93.89   4. Former smoker Z87.891    The findings on the CT of the chest are minimal and probably do not represent a progressive process.  We discussed this in detail and she is comfortable with foregoing any further evaluation at this time.  She is to remain on her annual screening with LDCT.   PLAN:  For evaluation of dyspnea and orthopnea, I have ordered a BNP and full pulmonary function tests  Follow-up in 4-6 weeks   Merton Border, MD PCCM service Mobile [phone] Pager [phone] 01/23/2017 11:20 AM

## 2017-02-10 ENCOUNTER — Other Ambulatory Visit: Payer: Self-pay | Admitting: Family Medicine

## 2017-02-13 ENCOUNTER — Ambulatory Visit
Admission: RE | Admit: 2017-02-13 | Discharge: 2017-02-13 | Disposition: A | Discharge status: Home / Self Care | Payer: BLUE CROSS/BLUE SHIELD | Source: Ambulatory Visit | Attending: Family Medicine | Admitting: Family Medicine

## 2017-02-13 DIAGNOSIS — Z1231 Encounter for screening mammogram for malignant neoplasm of breast: Secondary | ICD-10-CM | POA: Insufficient documentation

## 2017-02-13 DIAGNOSIS — Z1239 Encounter for other screening for malignant neoplasm of breast: Secondary | ICD-10-CM

## 2017-02-19 ENCOUNTER — Ambulatory Visit: Payer: BLUE CROSS/BLUE SHIELD

## 2017-02-23 ENCOUNTER — Ambulatory Visit: Payer: BLUE CROSS/BLUE SHIELD | Admitting: Pulmonary Disease

## 2017-03-03 DIAGNOSIS — B182 Chronic viral hepatitis C: Secondary | ICD-10-CM | POA: Diagnosis not present

## 2017-03-03 DIAGNOSIS — G4733 Obstructive sleep apnea (adult) (pediatric): Secondary | ICD-10-CM | POA: Diagnosis not present

## 2017-03-26 ENCOUNTER — Ambulatory Visit: Payer: BLUE CROSS/BLUE SHIELD | Attending: Pulmonary Disease

## 2017-03-26 DIAGNOSIS — R0600 Dyspnea, unspecified: Secondary | ICD-10-CM

## 2017-03-26 DIAGNOSIS — R0609 Other forms of dyspnea: Secondary | ICD-10-CM | POA: Insufficient documentation

## 2017-06-10 DIAGNOSIS — G4733 Obstructive sleep apnea (adult) (pediatric): Secondary | ICD-10-CM | POA: Diagnosis not present

## 2017-07-17 ENCOUNTER — Ambulatory Visit: Payer: BLUE CROSS/BLUE SHIELD | Admitting: Family Medicine

## 2017-07-24 ENCOUNTER — Other Ambulatory Visit: Payer: Self-pay

## 2017-07-24 ENCOUNTER — Encounter: Payer: Self-pay | Admitting: Family Medicine

## 2017-07-24 ENCOUNTER — Ambulatory Visit (INDEPENDENT_AMBULATORY_CARE_PROVIDER_SITE_OTHER): Payer: BLUE CROSS/BLUE SHIELD | Admitting: Family Medicine

## 2017-07-24 DIAGNOSIS — G4733 Obstructive sleep apnea (adult) (pediatric): Secondary | ICD-10-CM

## 2017-07-24 DIAGNOSIS — K219 Gastro-esophageal reflux disease without esophagitis: Secondary | ICD-10-CM

## 2017-07-24 DIAGNOSIS — N3946 Mixed incontinence: Secondary | ICD-10-CM | POA: Insufficient documentation

## 2017-07-24 DIAGNOSIS — R0609 Other forms of dyspnea: Secondary | ICD-10-CM | POA: Diagnosis not present

## 2017-07-24 DIAGNOSIS — G479 Sleep disorder, unspecified: Secondary | ICD-10-CM | POA: Diagnosis not present

## 2017-07-24 DIAGNOSIS — K644 Residual hemorrhoidal skin tags: Secondary | ICD-10-CM | POA: Diagnosis not present

## 2017-07-24 DIAGNOSIS — R0600 Dyspnea, unspecified: Secondary | ICD-10-CM

## 2017-07-24 DIAGNOSIS — Z9989 Dependence on other enabling machines and devices: Secondary | ICD-10-CM

## 2017-07-24 MED ORDER — PANTOPRAZOLE SODIUM 40 MG PO TBEC: 40.0000 mg | DELAYED_RELEASE_TABLET | Freq: Every day | ORAL | 1 refills | Status: DC

## 2017-07-24 NOTE — Patient Instructions (Signed)
Nice to see you. I refilled your Protonix. Please try to continue exercising and work on diet as outlined below. Please do kegel exercises and timed urination to avoid your urinary incontinence issues.   Kegel Exercises Kegel exercises help strengthen the muscles that support the rectum, vagina, small intestine, bladder, and uterus. Doing Kegel exercises can help:  Improve bladder and bowel control.  Improve sexual response.  Reduce problems and discomfort during pregnancy.  Kegel exercises involve squeezing your pelvic floor muscles, which are the same muscles you squeeze when you try to stop the flow of urine. The exercises can be done while sitting, standing, or lying down, but it is best to vary your position. Phase 1 exercises 1. Squeeze your pelvic floor muscles tight. You should feel a tight lift in your rectal area. If you are a female, you should also feel a tightness in your vaginal area. Keep your stomach, buttocks, and legs relaxed. 2. Hold the muscles tight for up to 10 seconds. 3. Relax your muscles. Repeat this exercise 50 times a day or as many times as told by your health care provider. Continue to do this exercise for at least 4-6 weeks or for as long as told by your health care provider. This information is not intended to replace advice given to you by your health care provider. Make sure you discuss any questions you have with your health care provider. Document Released: 02/18/2012 Document Revised: 10/27/2015 Document Reviewed: 01/21/2015 Elsevier Interactive Patient Education  2018 [employer].  Diet Recommendations  Starchy (carb) foods: Bread, rice, pasta, potatoes, corn, cereal, grits, crackers, bagels, muffins, all baked goods.  (Fruits, milk, and yogurt also have carbohydrate, but most of these foods will not spike your blood sugar as the starchy foods will.)  A few fruits do cause high blood sugars; use small portions of bananas (limit to 1/2 at a time),  grapes, watermelon, oranges, and most tropical fruits.    Protein foods: Meat, fish, poultry, eggs, dairy foods, and beans such as pinto and kidney beans (beans also provide carbohydrate).   1. Eat at least 3 meals and 1-2 snacks per day. Never go more than 4-5 hours while awake without eating. Eat breakfast within the first hour of getting up.   2. Limit starchy foods to TWO per meal and ONE per snack. ONE portion of a starchy  food is equal to the following:   - ONE slice of bread (or its equivalent, such as half of a hamburger bun).   - 1/2 cup of a "scoopable" starchy food such as potatoes or rice.   - 15 grams of carbohydrate as shown on food label.  3. Include at every meal: a protein food, a carb food, and vegetables and/or fruit.   - Obtain twice the volume of veg's as protein or carbohydrate foods for both lunch and dinner.   - Fresh or frozen veg's are best.   - Keep frozen veg's on hand for a quick vegetable serving.

## 2017-07-24 NOTE — Assessment & Plan Note (Signed)
Given Kegel exercises to do for stress incontinence.  Discussed timed voiding for urge incontinence.  I do not think her issues are frequent enough to require medication at this time.

## 2017-07-24 NOTE — Assessment & Plan Note (Signed)
She had evaluation with pulmonology.  Symptoms have improved.  Encouraged increasing activity and losing weight.

## 2017-07-24 NOTE — Assessment & Plan Note (Signed)
Well-controlled when she is on Protonix.  Refill given.

## 2017-07-24 NOTE — Assessment & Plan Note (Signed)
Discussed options for management including melatonin as well as Ambien though she deferred those things given that she was drowsy with the Ambien and melatonin did not work.  She opted to continue the Benadryl.  She does have some allergies and I think the Benadryl could be beneficial for those as well.

## 2017-07-24 NOTE — Progress Notes (Signed)
Tommi Rumps, MD Phone: [phone]  SUNDAI PROBERT is a 62 y.o. female who presents today for f/u.  GERD:   Reflux symptoms: burning if not taking protonix   Abd pain: no   Blood in stool: no  Dysphagia: no   EGD: yes, 2-3 x in the past  Medication: protonix  Hemorrhoids: She has not had any issues with her hemorrhoids in greater than 6 months.  No recent bleeding.  She was evaluated by surgery about 2 years ago and it looks like they advised to monitor.  OSA: Uses her CPAP nightly.  Gets 7 to 8 hours of sleep.  Wakes well rested.  Has been using Benadryl to help her sleep as she does wake up about twice a night and it takes about an hour to fall back asleep.  Benadryl is beneficial.  Melatonin has not been helpful.  She notes no anxiety or depression.  She was on Ambien past though that made her drowsy the next day.   She saw pulmonology for CT findings and was noted to have slight dyspnea on exertion.  PFTs were unremarkable.  BNP was unremarkable.  It seems as though this was related to weight gain.  She notes no coughing or wheezing.  No chest pain.  She did used to smoke.  She reports stress and urge incontinence.  Notes coughing and sneezing causes her to leak urine at times.  She notes urge incontinence only if she waits too long to urinate.  No frequency.  No dysuria.  No history of seeing urology.   Social History   Tobacco Use  Smoking Status Former Smoker  . Years: 4.00  . Last attempt to quit: 12/15/1992  . Years since quitting: 24.6  Smokeless Tobacco Never Used     ROS see history of present illness  Objective  Physical Exam Vitals:   07/24/17 0830  BP: 120/68  Pulse: 74  Temp: 98.5 F (36.9 C)  SpO2: 97%    BP Readings from Last 3 Encounters:  07/24/17 120/68  01/23/17 (!) 144/90  01/15/17 130/90   Wt Readings from Last 3 Encounters:  07/24/17 203 lb (92.1 kg)  01/23/17 201 lb (91.2 kg)  01/15/17 198 lb 8 oz (90 kg)    Physical Exam    Constitutional: No distress.  Cardiovascular: Normal rate, regular rhythm and normal heart sounds.  Pulmonary/Chest: Effort normal and breath sounds normal.  Abdominal: Soft. Bowel sounds are normal. She exhibits no distension. There is no tenderness.  Musculoskeletal: She exhibits no edema.  Neurological: She is alert.  Skin: Skin is warm and dry. She is not diaphoretic.     Assessment/Plan: Please see individual problem list.  OSA on CPAP Well-controlled.  Continue CPAP.  Sleeping difficulty Discussed options for management including melatonin as well as Ambien though she deferred those things given that she was drowsy with the Ambien and melatonin did not work.  She opted to continue the Benadryl.  She does have some allergies and I think the Benadryl could be beneficial for those as well.  Dyspnea on exertion She had evaluation with pulmonology.  Symptoms have improved.  Encouraged increasing activity and losing weight.  Mixed incontinence urge and stress Given Kegel exercises to do for stress incontinence.  Discussed timed voiding for urge incontinence.  I do not think her issues are frequent enough to require medication at this time.  GERD (gastroesophageal reflux disease) Well-controlled when she is on Protonix.  Refill given.  External hemorrhoids No  recent issues.  I did discuss having her see general surgery again to consider treatment for these though she deferred at this time.   No orders of the defined types were placed in this encounter.   Meds ordered this encounter  Medications  . pantoprazole (PROTONIX) 40 MG tablet    Sig: Take 1 tablet (40 mg total) by mouth daily.    Dispense:  90 tablet    Refill:  1     Tommi Rumps, MD [place]

## 2017-07-24 NOTE — Assessment & Plan Note (Signed)
No recent issues.  I did discuss having her see general surgery again to consider treatment for these though she deferred at this time.

## 2017-07-24 NOTE — Assessment & Plan Note (Signed)
Well-controlled.  Continue CPAP. 

## 2017-08-24 ENCOUNTER — Telehealth: Payer: Self-pay | Admitting: *Deleted

## 2017-08-24 NOTE — Telephone Encounter (Signed)
Left pt VM to contact office to get Follow-up CT scan scheduled.

## 2017-08-25 ENCOUNTER — Telehealth: Payer: Self-pay | Admitting: *Deleted

## 2017-08-25 DIAGNOSIS — Z87891 Personal history of nicotine dependence: Secondary | ICD-10-CM

## 2017-08-25 DIAGNOSIS — Z122 Encounter for screening for malignant neoplasm of respiratory organs: Secondary | ICD-10-CM

## 2017-08-25 NOTE — Telephone Encounter (Signed)
Notified patient that annual lung cancer screening low dose CT scan is due currently or will be in near future. Confirmed that patient is within the age range of 55-77, and asymptomatic, (no signs or symptoms of lung cancer). Patient denies illness that would prevent curative treatment for lung cancer if found. Verified smoking history, (current, 52 pack year). The shared decision making visit was done 09/02/16. Patient is agreeable for CT scan being scheduled.

## 2017-08-25 NOTE — Telephone Encounter (Signed)
Left message for patient to notify them that it is time to schedule annual low dose lung cancer screening CT scan. Instructed patient to call back to verify information prior to the scan being scheduled.  

## 2017-08-26 ENCOUNTER — Telehealth: Payer: Self-pay | Admitting: *Deleted

## 2017-08-26 NOTE — Telephone Encounter (Signed)
Spoke with pt to give CT scan appt details. Pt confirmed and reminder mailed. BF

## 2017-09-04 ENCOUNTER — Ambulatory Visit
Admission: RE | Admit: 2017-09-04 | Discharge: 2017-09-04 | Disposition: A | Discharge status: Home / Self Care | Payer: BLUE CROSS/BLUE SHIELD | Source: Ambulatory Visit | Attending: Oncology | Admitting: Oncology

## 2017-09-04 DIAGNOSIS — Z87891 Personal history of nicotine dependence: Secondary | ICD-10-CM | POA: Insufficient documentation

## 2017-09-04 DIAGNOSIS — Z122 Encounter for screening for malignant neoplasm of respiratory organs: Secondary | ICD-10-CM | POA: Diagnosis not present

## 2017-09-04 DIAGNOSIS — R918 Other nonspecific abnormal finding of lung field: Secondary | ICD-10-CM | POA: Insufficient documentation

## 2017-09-07 ENCOUNTER — Encounter: Payer: Self-pay | Admitting: *Deleted

## 2017-09-09 DIAGNOSIS — G4733 Obstructive sleep apnea (adult) (pediatric): Secondary | ICD-10-CM | POA: Diagnosis not present

## 2017-10-09 DIAGNOSIS — Z1283 Encounter for screening for malignant neoplasm of skin: Secondary | ICD-10-CM | POA: Diagnosis not present

## 2017-10-09 DIAGNOSIS — L709 Acne, unspecified: Secondary | ICD-10-CM | POA: Diagnosis not present

## 2017-10-09 DIAGNOSIS — L821 Other seborrheic keratosis: Secondary | ICD-10-CM | POA: Diagnosis not present

## 2017-10-09 DIAGNOSIS — D2361 Other benign neoplasm of skin of right upper limb, including shoulder: Secondary | ICD-10-CM | POA: Diagnosis not present

## 2017-12-09 DIAGNOSIS — G4733 Obstructive sleep apnea (adult) (pediatric): Secondary | ICD-10-CM | POA: Diagnosis not present

## 2018-01-28 ENCOUNTER — Other Ambulatory Visit: Payer: Self-pay | Admitting: Family Medicine

## 2018-01-28 DIAGNOSIS — Z1231 Encounter for screening mammogram for malignant neoplasm of breast: Secondary | ICD-10-CM

## 2018-01-29 ENCOUNTER — Encounter: Payer: Self-pay | Admitting: Family Medicine

## 2018-01-29 ENCOUNTER — Ambulatory Visit (INDEPENDENT_AMBULATORY_CARE_PROVIDER_SITE_OTHER): Payer: BLUE CROSS/BLUE SHIELD

## 2018-01-29 ENCOUNTER — Ambulatory Visit (INDEPENDENT_AMBULATORY_CARE_PROVIDER_SITE_OTHER): Payer: BLUE CROSS/BLUE SHIELD | Admitting: Family Medicine

## 2018-01-29 VITALS — BP 120/88 | HR 63 | Temp 98.8°F | Resp 14 | Ht 65.0 in | Wt 206.6 lb

## 2018-01-29 DIAGNOSIS — E785 Hyperlipidemia, unspecified: Secondary | ICD-10-CM | POA: Diagnosis not present

## 2018-01-29 DIAGNOSIS — Z6834 Body mass index (BMI) 34.0-34.9, adult: Secondary | ICD-10-CM | POA: Diagnosis not present

## 2018-01-29 DIAGNOSIS — Z1329 Encounter for screening for other suspected endocrine disorder: Secondary | ICD-10-CM

## 2018-01-29 DIAGNOSIS — K644 Residual hemorrhoidal skin tags: Secondary | ICD-10-CM

## 2018-01-29 DIAGNOSIS — K625 Hemorrhage of anus and rectum: Secondary | ICD-10-CM | POA: Diagnosis not present

## 2018-01-29 DIAGNOSIS — Z23 Encounter for immunization: Secondary | ICD-10-CM | POA: Diagnosis not present

## 2018-01-29 DIAGNOSIS — N9089 Other specified noninflammatory disorders of vulva and perineum: Secondary | ICD-10-CM

## 2018-01-29 DIAGNOSIS — E6609 Other obesity due to excess calories: Secondary | ICD-10-CM | POA: Diagnosis not present

## 2018-01-29 DIAGNOSIS — R0781 Pleurodynia: Secondary | ICD-10-CM

## 2018-01-29 DIAGNOSIS — Z0001 Encounter for general adult medical examination with abnormal findings: Secondary | ICD-10-CM

## 2018-01-29 DIAGNOSIS — M533 Sacrococcygeal disorders, not elsewhere classified: Secondary | ICD-10-CM

## 2018-01-29 DIAGNOSIS — K649 Unspecified hemorrhoids: Secondary | ICD-10-CM

## 2018-01-29 LAB — LIPID PANEL
Cholesterol: 246 mg/dL — ABNORMAL HIGH (ref 0–200)
HDL: 55.7 mg/dL (ref >39.00)
LDL Cholesterol: 153 mg/dL — ABNORMAL HIGH (ref 0–99)
NONHDL: 189.9
Total CHOL/HDL Ratio: 4
Triglycerides: 186 mg/dL — ABNORMAL HIGH (ref 0.0–149.0)
VLDL: 37.2 mg/dL (ref 0.0–40.0)

## 2018-01-29 LAB — COMPREHENSIVE METABOLIC PANEL
ALK PHOS: 51 U/L (ref 39–117)
ALT: 21 U/L (ref 0–35)
AST: 15 U/L (ref 0–37)
Albumin: 4.5 g/dL (ref 3.5–5.2)
BILIRUBIN TOTAL: 0.6 mg/dL (ref 0.2–1.2)
BUN: 14 mg/dL (ref 6–23)
CO2: 27 mEq/L (ref 19–32)
Calcium: 9.7 mg/dL (ref 8.4–10.5)
Chloride: 104 mEq/L (ref 96–112)
Creatinine, Ser: 0.79 mg/dL (ref 0.40–1.20)
GFR: 78.17 mL/min (ref >60.00)
GLUCOSE: 100 mg/dL — AB (ref 70–99)
Potassium: 4 mEq/L (ref 3.5–5.1)
Sodium: 141 mEq/L (ref 135–145)
TOTAL PROTEIN: 7.6 g/dL (ref 6.0–8.3)

## 2018-01-29 LAB — CBC
HCT: 40.8 % (ref 36.0–46.0)
Hemoglobin: 14.4 g/dL (ref 12.0–15.0)
MCHC: 35.3 g/dL (ref 30.0–36.0)
MCV: 97 fl (ref 78.0–100.0)
PLATELETS: 192 10*3/uL (ref 150.0–400.0)
RBC: 4.21 Mil/uL (ref 3.87–5.11)
RDW: 12.8 % (ref 11.5–15.5)
WBC: 8.5 10*3/uL (ref 4.0–10.5)

## 2018-01-29 LAB — HEMOGLOBIN A1C: HEMOGLOBIN A1C: 5.6 % (ref 4.6–6.5)

## 2018-01-29 LAB — TSH: TSH: 1.47 u[IU]/mL (ref 0.35–4.50)

## 2018-01-29 MED ORDER — PANTOPRAZOLE SODIUM 40 MG PO TBEC
40.0000 mg | DELAYED_RELEASE_TABLET | Freq: Every day | ORAL | 1 refills | Status: DC
Start: 2018-01-29 — End: 2019-03-22

## 2018-01-29 NOTE — Patient Instructions (Signed)
Nice to see you. We will check lab work today and contact her with results. Please try to decrease her alcohol intake.  Please do not decrease it all at once. We will get you to see GI, gynecology, and general surgery. If you develop black tarry stools please be evaluated. If you develop increased rectal bleeding please be evaluated.

## 2018-01-29 NOTE — Assessment & Plan Note (Signed)
Physical exam completed.  Discussed adding exercise and monitoring diet.  Tetanus vaccination given.  She will complete her mammogram.  Lab work as outlined below.

## 2018-01-29 NOTE — Assessment & Plan Note (Signed)
Suspect strain though we will check lab work to evaluate right upper quadrant issues.  We will also x-ray given persistence.

## 2018-01-29 NOTE — Assessment & Plan Note (Signed)
Patient reports issues chronically with her SI joint.  We will refer to orthopedics.

## 2018-01-29 NOTE — Assessment & Plan Note (Addendum)
Will refer to general surgery for treatment of these.  Given her black-colored stools we will also refer to GI to consider upper endoscopy to be particularly given alternating constipation and diarrhea.  These stools are not tarry in nature.  We will check a CBC.  Discussed return precautions.

## 2018-01-29 NOTE — Assessment & Plan Note (Signed)
This would appear to be a bruise possibly related to her scratching.  There is no apparent mass lesion.  She does appear to have some atrophic tissue which may be related to her being postmenopausal.  We will refer her to GYN to evaluate this further.

## 2018-01-29 NOTE — Progress Notes (Signed)
Tommi Rumps, MD Phone: [phone]  Linda Henson is a 62 y.o. female who presents today for CPE.  Not exercising.  She is looking at starting soon. She eats a normal diet.  No soda or sweet tea. Tetanus vaccination is due.  Flu vaccination and Shingrix up-to-date. Hepatitis C screening up-to-date. Her mammogram is scheduled. Colonoscopy in 2016 with 5-year recall. No family history of breast cancer, ovarian cancer, or colon cancer. No tobacco use or illicit drug use.  She notes she is drinking 2 glasses of wine daily now.  Previously she was drinking liquor drinks where she was filling the glass up fairly high.  She has cut that out.  Anxiety: Patient notes her daughter "drives her crazy."  She notes this causes some anxiety.  No depression.  Stress incontinence: She does note some mild stress incontinence with coughing. This is chronic.  Patient has noted some right upper quadrant and right flank pain intermittently that occurs once every few weeks for the last couple of months.  Not associated with eating.  There is no hematuria or dysuria.  She does note occasional black-colored stools though no tarry stools.  She does have hemorrhoids and has bleeding from them 1 time every 2 to 3 months and she is ready to see somebody about that.  She has alternating constipation and diarrhea.  Patient also reports a spot on her inner internal vagina that is about the size of a dime.  Notes its purple.  Notes it has been there about 2 weeks.  Notes she did have some itching externally that started prior to the purple spot.  She would scratch at it.  Patient reports chronic issues with her SI joint.  Active Ambulatory Problems    Diagnosis Date Noted  . Encounter for general adult medical examination with abnormal findings 01/15/2013  . S/P hysterectomy 02/22/2014  . S/P LASIK surgery of both eyes 02/22/2014  . Obesity 02/23/2014  . OSA on CPAP 11/12/2014  . GERD (gastroesophageal  reflux disease) 11/12/2014  . External hemorrhoids 02/14/2015  . Hyperlipidemia 01/21/2016  . Personal history of tobacco use, presenting hazards to health 09/03/2016  . Dyspnea on exertion 01/15/2017  . Positional numbness 01/15/2017  . Depression, major, single episode, mild ([place]) 01/15/2017  . Sleeping difficulty 07/24/2017  . Mixed incontinence urge and stress 07/24/2017  . Rib pain on right side 01/29/2018  . Labial lesion 01/29/2018  . SI (sacroiliac) joint dysfunction 01/29/2018   Resolved Ambulatory Problems    Diagnosis Date Noted  . Screening for breast cancer 01/15/2013  . Sleep disturbance 01/15/2013  . Acute bronchitis 02/23/2014  . Bright red blood per rectum 02/23/2014  . LUQ pain 11/12/2014  . Viral respiratory illness 04/24/2015   Past Medical History:  Diagnosis Date  . Colon polyp   . Diverticulitis   . Iron deficiency anemia 2014  . Sleep apnea     Family History  Problem Relation Age of Onset  . Heart disease Father 80       CAD - died of MI  . Hyperlipidemia Father   . Cancer Brother 41       stomach and esophageal cancer  . Hypothyroidism Sister   . Diabetes Paternal Grandmother   . Breast cancer Neg Hx     Social History   Socioeconomic History  . Marital status: Widowed    Spouse name: Not on file  . Number of children: 1  . Years of education: 36  . Highest education  level: Not on file  Occupational History  . Occupation: Dedicated Hydrologist: Arts development officer    Comment: [employer]  . Financial resource strain: Not on file  . Food insecurity:    Worry: Not on file    Inability: Not on file  . Transportation needs:    Medical: Not on file    Non-medical: Not on file  Tobacco Use  . Smoking status: Former Smoker    Years: 4.00    Last attempt to quit: 12/15/1992    Years since quitting: 25.1  . Smokeless tobacco: Never Used  Substance and Sexual Activity  . Alcohol use: Yes    Alcohol/week: 7.0  standard drinks    Types: 7 Glasses of wine per week  . Drug use: No  . Sexual activity: Not on file  Lifestyle  . Physical activity:    Days per week: Not on file    Minutes per session: Not on file  . Stress: Not on file  Relationships  . Social connections:    Talks on phone: Not on file    Gets together: Not on file    Attends religious service: Not on file    Active member of club or organization: Not on file    Attends meetings of clubs or organizations: Not on file    Relationship status: Not on file  . Intimate partner violence:    Fear of current or ex partner: Not on file    Emotionally abused: Not on file    Physically abused: Not on file    Forced sexual activity: Not on file  Other Topics Concern  . Not on file  Social History Narrative   Shavonn grew up in [place], [place]. She is widowed for 5 years. She lives at home with her 62 year old mother. 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 She enjoys playing golf.     ROS  General:  Negative for nexplained weight loss, fever Skin: Negative for new or changing mole, sore that won't heal HEENT: Negative for trouble hearing, trouble seeing, ringing in ears, mouth sores, hoarseness, change in voice, dysphagia. CV:  Negative for chest pain, dyspnea, edema, palpitations Resp: Negative for cough, dyspnea, hemoptysis GI: positive for diarrhea, constipation, abdominal pain, black stools, hematochezia.  Negative for nausea, vomiting GU: Positive for incontinence, labial lesion, negative for dysuria, urinary hesitance, hematuria, vaginal or penile discharge, polyuria, sexual difficulty, lumps in testicle or breasts MSK: Negative for muscle cramps or aches, positive for joint pain or swelling Neuro: Negative for headaches, weakness, numbness, dizziness, passing out/fainting Psych: Positive for anxiety, negative for depression, memory problems  Objective  Physical Exam Vitals:    01/29/18 0818  BP: 120/88  Pulse: 63  Resp: 14  Temp: 98.8 F (37.1 C)  SpO2: 97%    BP Readings from Last 3 Encounters:  01/29/18 120/88  07/24/17 120/68  01/23/17 (!) 144/90   Wt Readings from Last 3 Encounters:  01/29/18 206 lb 9.6 oz (93.7 kg)  09/04/17 200 lb (90.7 kg)  07/24/17 203 lb (92.1 kg)    Physical Exam  Constitutional: No distress.  HENT:  Head: Normocephalic and atraumatic.  Mouth/Throat: Oropharynx is clear and moist.  Eyes: Pupils are equal, round, and reactive to light. Conjunctivae are normal.  Neck: Neck supple.  Cardiovascular: Normal rate, regular rhythm and normal heart sounds.  Pulmonary/Chest: Effort normal and breath sounds normal.  Abdominal: Soft. Bowel sounds are normal. She exhibits no distension. There is no tenderness. There is no rebound and no guarding.  Genitourinary:     Genitourinary Comments: Chaperone used, atrophic vaginal tissue, no discharge, status post hysterectomy with no cervix visualized, benign bimanual exam, bilateral breasts with no masses, tenderness, nipple inversion, or skin changes, no axillary masses bilaterally  Musculoskeletal: She exhibits no edema.  Lymphadenopathy:    She has no cervical adenopathy.  Neurological: She is alert.  Skin: Skin is warm and dry. She is not diaphoretic.  Psychiatric:  Patient reports some anxiety, affect is normal  Rectal exam with external hemorrhoids noted, negative FOBT no blood on rectal exam   Assessment/Plan:   Encounter for general adult medical examination with abnormal findings Physical exam completed.  Discussed adding exercise and monitoring diet.  Tetanus vaccination given.  She will complete her mammogram.  Lab work as outlined below.  External hemorrhoids Will refer to general surgery for treatment of these.  Given her black-colored stools we will also refer to GI to consider upper endoscopy to be particularly given alternating constipation and diarrhea.  These  stools are not tarry in nature.  We will check a CBC.  Discussed return precautions.  Labial lesion This would appear to be a bruise possibly related to her scratching.  There is no apparent mass lesion.  She does appear to have some atrophic tissue which may be related to her being postmenopausal.  We will refer her to GYN to evaluate this further.  Rib pain on right side Suspect strain though we will check lab work to evaluate right upper quadrant issues.  We will also x-ray given persistence.  SI (sacroiliac) joint dysfunction Patient reports issues chronically with her SI joint.  We will refer to orthopedics.   Orders Placed This Encounter  Procedures  . DG Ribs Unilateral W/Chest Right    Standing Status:   Future    Number of Occurrences:   1    Standing Expiration Date:   04/01/2019    Order Specific Question:   Reason for Exam (SYMPTOM  OR DIAGNOSIS REQUIRED)    Answer:   right lower rib pain for several months    Order Specific Question:   Preferred imaging location?    Answer:   [facility] Specific Question:   Radiology Contrast Protocol - do NOT remove file path    Answer:   [email or web address]  . Tdap vaccine greater than or equal to 7yo IM  . Comp Met (CMET)  . Lipid panel  . HgB A1c  . CBC  . TSH  . Ambulatory referral to Gynecology    Referral Priority:   Routine    Referral Type:   Consultation    Referral Reason:   Specialty Services Required    Requested Specialty:   Gynecology    Number of Visits Requested:   1  . Ambulatory referral to General Surgery    Referral Priority:   Routine    Referral Type:   Surgical    Referral Reason:   Specialty Services Required    Requested Specialty:   General Surgery    Number of Visits Requested:   1  . Ambulatory referral to Gastroenterology    Referral Priority:   Routine    Referral Type:   Consultation    Referral Reason:   Specialty Services Required     Number of Visits Requested:   1  . Ambulatory referral  to Orthopedic Surgery    Referral Priority:   Routine    Referral Type:   Surgical    Referral Reason:   Specialty Services Required    Requested Specialty:   Orthopedic Surgery    Number of Visits Requested:   1    Meds ordered this encounter  Medications  . pantoprazole (PROTONIX) 40 MG tablet    Sig: Take 1 tablet (40 mg total) by mouth daily.    Dispense:  90 tablet    Refill:  1     Tommi Rumps, MD [place]

## 2018-02-02 ENCOUNTER — Telehealth: Payer: Self-pay | Admitting: Obstetrics & Gynecology

## 2018-02-02 NOTE — Telephone Encounter (Signed)
[facility] referring for Labial lesion. Called and left voicemail for patient to call back to be schedule °

## 2018-02-04 ENCOUNTER — Other Ambulatory Visit: Payer: Self-pay | Admitting: Family Medicine

## 2018-02-04 DIAGNOSIS — J849 Interstitial pulmonary disease, unspecified: Secondary | ICD-10-CM

## 2018-02-04 NOTE — Telephone Encounter (Signed)
Called and left voice mail for patient to call back to be schedule °

## 2018-02-09 ENCOUNTER — Ambulatory Visit: Payer: BLUE CROSS/BLUE SHIELD | Admitting: Pulmonary Disease

## 2018-02-18 ENCOUNTER — Other Ambulatory Visit: Payer: Self-pay

## 2018-02-18 ENCOUNTER — Encounter: Payer: Self-pay | Admitting: General Surgery

## 2018-02-18 ENCOUNTER — Ambulatory Visit (INDEPENDENT_AMBULATORY_CARE_PROVIDER_SITE_OTHER): Payer: BLUE CROSS/BLUE SHIELD | Admitting: General Surgery

## 2018-02-18 VITALS — BP 157/91 | HR 70 | Temp 97.5°F | Resp 16 | Ht 65.0 in | Wt 207.0 lb

## 2018-02-18 DIAGNOSIS — K644 Residual hemorrhoidal skin tags: Secondary | ICD-10-CM | POA: Diagnosis not present

## 2018-02-18 DIAGNOSIS — K648 Other hemorrhoids: Secondary | ICD-10-CM | POA: Diagnosis not present

## 2018-02-18 NOTE — Patient Instructions (Addendum)
  Recommend using wet wipes to clean rectal area and not use soap.  Gentle pat dry the area. May use small amount zinc oxide or desitin as well. Call with a status report regarding symptoms

## 2018-02-18 NOTE — Progress Notes (Signed)
Patient ID: Linda Henson, female   DOB: Jul 23, 1955, 62 y.o.   MRN: [id number]  Chief Complaint  Patient presents with  . Follow-up    hemorrhoids    HPI Linda Henson is a 62 y.o. female.  Here to discuss having hemorrhoid surgery. She was seen in 2016 and wanted to wait on surgery then. She states she notices bleeding with BM about once a month, lasting about 15 minutes. She states she has rectal itching as well. Bowels move every other day described as a "ribbon" shape.   HPI  Past Medical History:  Diagnosis Date  . Colon polyp    Repeat colonoscopy 2018  . Diverticulitis   . GERD (gastroesophageal reflux disease)   . Hyperlipidemia    Borderline   . Iron deficiency anemia 2014  . Sleep apnea     Past Surgical History:  Procedure Laterality Date  . ABDOMINAL HYSTERECTOMY  1994  . COLONOSCOPY WITH PROPOFOL N/A 02/05/2015   Procedure: COLONOSCOPY WITH PROPOFOL;  Surgeon: Josefine Class, MD;  Location: [facility] ENDOSCOPY;  Service: Endoscopy;  Laterality: N/A;  . EYE SURGERY    . SALPINGOOPHORECTOMY Right 1994    Family History  Problem Relation Age of Onset  . Heart disease Father 47       CAD - died of MI  . Hyperlipidemia Father   . Cancer Brother 41       stomach and esophageal cancer  . Hypothyroidism Sister   . Diabetes Paternal Grandmother   . Breast cancer Neg Hx     Social History Social History   Tobacco Use  . Smoking status: Former Smoker    Years: 4.00    Last attempt to quit: 12/15/1992    Years since quitting: 25.1  . Smokeless tobacco: Never Used  Substance Use Topics  . Alcohol use: Yes    Alcohol/week: 7.0 standard drinks    Types: 7 Glasses of wine per week  . Drug use: No    No Known Allergies  Current Outpatient Medications  Medication Sig Dispense Refill  . pantoprazole (PROTONIX) 40 MG tablet Take 1 tablet (40 mg total) by mouth daily. 90 tablet 1   No current facility-administered medications for this visit.      Review of Systems Review of Systems  Constitutional: Negative.   Respiratory: Negative.   Cardiovascular: Negative.   Gastrointestinal: Negative for constipation and diarrhea.    Blood pressure (!) 157/91, pulse 70, temperature (!) 97.5 F (36.4 C), temperature source Skin, resp. rate 16, height 5\' 5"  (1.651 m), weight 207 lb (93.9 kg), SpO2 94 %.  Physical Exam Physical Exam  Constitutional: She is oriented to person, place, and time. She appears well-developed and well-nourished.  HENT:  Mouth/Throat: Oropharynx is clear and moist.  Eyes: Conjunctivae are normal.  Neck: Neck supple.  Cardiovascular: Normal rate, regular rhythm and normal heart sounds.  Pulmonary/Chest: Effort normal and breath sounds normal.  Abdominal: Soft. Normal appearance.  Genitourinary: Rectal exam shows internal hemorrhoid.     Genitourinary Comments: Anal skin tags  Neurological: She is alert and oriented to person, place, and time.  Skin: Skin is warm.  Psychiatric: Her behavior is normal.  Normal sphincter tone.  No rectal masses.  Data Reviewed February 05, 2015 colonoscopy reviewed: Large, nonthrombosed external hemorrhoids reported, internal hemorrhoids.  No primary colonic pathology was noted.  Recommendation for 5-year follow-up.  Assessment    External anal skin tags with perianal skin thinning secondary to soap.  Small,  prolapsing internal hemorrhoid with straining.    Plan    Recommend using wet wipes to clean rectal area and not use soap. Gentle pat dry the area.  Hairdryer on cool setting to be sure the skin is clean before dressing.  May use small amount zinc oxide or desitin as well. Call with a status report in 3 weeks regarding symptoms.    If she does not notice improvement we can resect this area, although if this could be avoided it would be good.   HPI, Physical Exam, Assessment and Plan have been scribed under the direction and in the presence of Robert Bellow,  MD. Karie Fetch, RN  I have completed the exam and reviewed the above documentation for accuracy and completeness.  I agree with the above.  Haematologist has been used and any errors in dictation or transcription are unintentional.  Hervey Ard, M.D., F.A.C.S.  [name] 02/18/2018, 7:39 PM  Patient was asked to follow up in the office in 3 weeks with her progress.   Dominga Ferry, CMA

## 2018-02-19 ENCOUNTER — Other Ambulatory Visit (HOSPITAL_COMMUNITY)
Admission: RE | Admit: 2018-02-19 | Discharge: 2018-02-19 | Disposition: A | Discharge status: Home / Self Care | Payer: BLUE CROSS/BLUE SHIELD | Source: Ambulatory Visit | Attending: Obstetrics and Gynecology | Admitting: Obstetrics and Gynecology

## 2018-02-19 ENCOUNTER — Encounter: Payer: Self-pay | Admitting: Obstetrics and Gynecology

## 2018-02-19 ENCOUNTER — Ambulatory Visit (INDEPENDENT_AMBULATORY_CARE_PROVIDER_SITE_OTHER): Payer: BLUE CROSS/BLUE SHIELD | Admitting: Obstetrics and Gynecology

## 2018-02-19 VITALS — BP 128/84 | Ht 65.0 in | Wt 208.0 lb

## 2018-02-19 DIAGNOSIS — N904 Leukoplakia of vulva: Secondary | ICD-10-CM

## 2018-02-19 DIAGNOSIS — M533 Sacrococcygeal disorders, not elsewhere classified: Secondary | ICD-10-CM | POA: Diagnosis not present

## 2018-02-19 DIAGNOSIS — N9089 Other specified noninflammatory disorders of vulva and perineum: Secondary | ICD-10-CM | POA: Diagnosis not present

## 2018-02-19 DIAGNOSIS — M5136 Other intervertebral disc degeneration, lumbar region: Secondary | ICD-10-CM | POA: Diagnosis not present

## 2018-02-19 DIAGNOSIS — M545 Low back pain: Secondary | ICD-10-CM | POA: Diagnosis not present

## 2018-02-19 DIAGNOSIS — M5441 Lumbago with sciatica, right side: Secondary | ICD-10-CM | POA: Diagnosis not present

## 2018-02-19 DIAGNOSIS — G8929 Other chronic pain: Secondary | ICD-10-CM | POA: Diagnosis not present

## 2018-02-19 DIAGNOSIS — M419 Scoliosis, unspecified: Secondary | ICD-10-CM | POA: Diagnosis not present

## 2018-02-19 NOTE — Progress Notes (Signed)
Obstetrics & Gynecology Office Visit   Chief Complaint  Patient presents with  . Referral  Dr. Tommi Rumps from [facility] for labial lesion  History of Present Illness: 62 y.o. G76P1001 female who presents in referral from Dr. Tommi Rumps from [facility] for the above. She notes that she has had some vaginal itching that started about two months ago. She has tried some OTC medications (e.g. Monistat) that did not help.  She notes the itching the most in the front area of her vagina (near her clitoral region).  She is not sexually active and has not been for 10 years.  She notes a purple area about a month or so ago.  The lesion is in the area of itching. The lesion has not changed since she first noticed it. She describes the lesion as looking like a "bruise." Dr. Caryl Bis evaluated the area and states that it looked like a bruise.  She is status post abdominal hysterectomy about 25 years ago.  She hsa never had a lesion like this before.    Past Medical History:  Diagnosis Date  . Colon polyp    Repeat colonoscopy 2018  . Diverticulitis   . GERD (gastroesophageal reflux disease)   . Hyperlipidemia    Borderline   . Iron deficiency anemia 2014  . Sleep apnea    Past Surgical History:  Procedure Laterality Date  . ABDOMINAL HYSTERECTOMY  1994  . COLONOSCOPY WITH PROPOFOL N/A 02/05/2015   Procedure: COLONOSCOPY WITH PROPOFOL;  Surgeon: Josefine Class, MD;  Location: [facility] ENDOSCOPY;  Service: Endoscopy;  Laterality: N/A;  . EYE SURGERY    . SALPINGOOPHORECTOMY Right 1994   Gynecologic History: No LMP recorded. Patient has had a hysterectomy.  Obstetric History: G1P1001  Family History  Problem Relation Age of Onset  . Heart disease Father 88       CAD - died of MI  . Hyperlipidemia Father   . Cancer Brother 41       stomach and esophageal cancer  . Hypothyroidism Sister   . Diabetes Paternal Grandmother   . Breast cancer Neg Hx    Social  History   Socioeconomic History  . Marital status: Widowed    Spouse name: Not on file  . Number of children: 1  . Years of education: 42  . Highest education level: Not on file  Occupational History  . Occupation: Dedicated Hydrologist: Arts development officer    Comment: [place]  . Financial resource strain: Not on file  . Food insecurity:    Worry: Not on file    Inability: Not on file  . Transportation needs:    Medical: Not on file    Non-medical: Not on file  Tobacco Use  . Smoking status: Former Smoker    Years: 4.00    Last attempt to quit: 12/15/1992    Years since quitting: 25.2  . Smokeless tobacco: Never Used  Substance and Sexual Activity  . Alcohol use: Yes    Alcohol/week: 7.0 standard drinks    Types: 7 Glasses of wine per week  . Drug use: No  . Sexual activity: Not Currently    Birth control/protection: Surgical  Lifestyle  . Physical activity:    Days per week: Not on file    Minutes per session: Not on file  . Stress: Not on file  Relationships  . Social connections:    Talks on phone: Not on file  Gets together: Not on file    Attends religious service: Not on file    Active member of club or organization: Not on file    Attends meetings of clubs or organizations: Not on file    Relationship status: Not on file  . Intimate partner violence:    Fear of current or ex partner: Not on file    Emotionally abused: Not on file    Physically abused: Not on file    Forced sexual activity: Not on file  Other Topics Concern  . Not on file  Social History Narrative   Dia grew up in [place], [place]. She is widowed for 5 years. She lives at home with her 56 year old mother. 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 She enjoys playing golf.    Allergies: No Known Allergies  Prior to Admission medications   Medication Sig Start Date End Date Taking? Authorizing Provider  pantoprazole  (PROTONIX) 40 MG tablet Take 1 tablet (40 mg total) by mouth daily. 01/29/18   Leone Haven, MD    Review of Systems  Constitutional: Negative.   HENT: Negative.   Eyes: Negative.   Respiratory: Negative.   Cardiovascular: Negative.   Gastrointestinal: Negative.   Genitourinary: Negative.        See HPI  Musculoskeletal: Negative.   Skin: Negative.   Neurological: Negative.   Psychiatric/Behavioral: Negative.      Physical Exam BP 128/84   Ht 5\' 5"  (1.651 m)   Wt 208 lb (94.3 kg)   BMI 34.61 kg/m  No LMP recorded. Patient has had a hysterectomy. Physical Exam  Constitutional: She is oriented to person, place, and time. She appears well-developed and well-nourished. No distress.  Genitourinary: Pelvic exam was performed with patient supine. There is labial fusion.  There is rash on the right labia. There is no tenderness, lesion or injury on the right labia.  There is rash on the left labia. There is no tenderness, lesion or injury on the left labia.     Genitourinary Comments: Loss of architecture of with fusion of the labia minora and hymenal ring.   Area including the clitoral hood, but predominantly to the right side with pale skin, areas of breaks in the skin. No excoriations.  There is some skin discoloration that is apparently normal bilaterally in the area of the anterior labia minora.  This appears normal with no concerning features.  The report is a more predominant left-sided component, but on my exam the right side appears more predominant.    HENT:  Head: Normocephalic and atraumatic.  Eyes: Conjunctivae are normal. No scleral icterus.  Neck: Normal range of motion. Neck supple.  Cardiovascular: Normal rate and regular rhythm.  Pulmonary/Chest: Effort normal and breath sounds normal. No respiratory distress.  Abdominal: Soft. Bowel sounds are normal. She exhibits no distension. There is no tenderness. There is no guarding.  Musculoskeletal: Normal range of  motion. She exhibits no edema.  Neurological: She is alert and oriented to person, place, and time. No cranial nerve deficit.  Psychiatric: She has a normal mood and affect. Her behavior is normal. Judgment normal.   Procedure note: Discussion regarding need for biopsy.  In the area of the mons just anterior to the clitoris.  Procedure discussed, including risks and benefits.  Area clean with betadine x 2.  Lidocaine 1%, plain, 2 mL total injected in the area just to the right and  Just including the midline.  A 3.5 mm punch biopsy tool utilized to biopsy to the level including the dermis. Careful attention given to avoid any clitoral structures.  Specimen elevated with pickups and cut with scissors at the base. Specimen placed in formalin.  Skin biopsy site made hemostatic with a single, figure-of-eight stitch of 3-0 vicryl.  Hemostasis noted.    The patient tolerated the procedure well.    Female chaperone present for pelvic and breast  portions of the physical exam  Assessment: 62 y.o. G12P1001 female here for  1. Labial lesion      Plan: Problem List Items Addressed This Visit      Other   Labial lesion - Primary   Relevant Orders   Surgical pathology     Area of initial concern appears normal today.  Will continue to monitor, if requested by Dr. Caryl Bis.  I did biopsy the mons area above the clitoral hood as she may have a chronic vulvar condition (itching, loss of architecture, appearance of skin).  Though unlikely, could also be Paget's disease of the vulva.  Biopsy to confirm.  Discussed that she may need topical steroids to manage symptoms pending the outcome of the biopsy.  I will call patient with results.   Prentice Docker, MD 02/21/2018 7:49 PM    CC: Leone Haven, MD [address], [place] [address]

## 2018-02-21 ENCOUNTER — Encounter: Payer: Self-pay | Admitting: Obstetrics and Gynecology

## 2018-02-25 DIAGNOSIS — L700 Acne vulgaris: Secondary | ICD-10-CM | POA: Diagnosis not present

## 2018-02-25 DIAGNOSIS — R21 Rash and other nonspecific skin eruption: Secondary | ICD-10-CM | POA: Diagnosis not present

## 2018-03-04 DIAGNOSIS — R1011 Right upper quadrant pain: Secondary | ICD-10-CM | POA: Diagnosis not present

## 2018-03-04 DIAGNOSIS — K625 Hemorrhage of anus and rectum: Secondary | ICD-10-CM | POA: Diagnosis not present

## 2018-03-04 DIAGNOSIS — R1013 Epigastric pain: Secondary | ICD-10-CM | POA: Diagnosis not present

## 2018-03-05 ENCOUNTER — Other Ambulatory Visit: Payer: Self-pay | Admitting: Obstetrics and Gynecology

## 2018-03-05 ENCOUNTER — Other Ambulatory Visit: Payer: Self-pay | Admitting: Student

## 2018-03-05 ENCOUNTER — Ambulatory Visit
Admission: RE | Admit: 2018-03-05 | Discharge: 2018-03-05 | Disposition: A | Discharge status: Home / Self Care | Payer: BLUE CROSS/BLUE SHIELD | Source: Ambulatory Visit | Attending: Family Medicine | Admitting: Family Medicine

## 2018-03-05 DIAGNOSIS — Z1231 Encounter for screening mammogram for malignant neoplasm of breast: Secondary | ICD-10-CM | POA: Diagnosis not present

## 2018-03-05 DIAGNOSIS — L900 Lichen sclerosus et atrophicus: Secondary | ICD-10-CM | POA: Insufficient documentation

## 2018-03-05 DIAGNOSIS — R1013 Epigastric pain: Secondary | ICD-10-CM

## 2018-03-05 DIAGNOSIS — R1011 Right upper quadrant pain: Secondary | ICD-10-CM

## 2018-03-05 MED ORDER — CLOBETASOL PROPIONATE 0.05 % EX OINT: TOPICAL_OINTMENT | CUTANEOUS | 0 refills | Status: DC

## 2018-03-09 DIAGNOSIS — G4733 Obstructive sleep apnea (adult) (pediatric): Secondary | ICD-10-CM | POA: Diagnosis not present

## 2018-03-11 ENCOUNTER — Ambulatory Visit
Admission: RE | Admit: 2018-03-11 | Discharge: 2018-03-11 | Disposition: A | Discharge status: Home / Self Care | Payer: BLUE CROSS/BLUE SHIELD | Source: Ambulatory Visit | Attending: Student | Admitting: Student

## 2018-03-11 DIAGNOSIS — R1013 Epigastric pain: Secondary | ICD-10-CM | POA: Diagnosis not present

## 2018-03-11 DIAGNOSIS — K8020 Calculus of gallbladder without cholecystitis without obstruction: Secondary | ICD-10-CM | POA: Diagnosis not present

## 2018-03-11 DIAGNOSIS — R1011 Right upper quadrant pain: Secondary | ICD-10-CM

## 2018-03-11 DIAGNOSIS — K760 Fatty (change of) liver, not elsewhere classified: Secondary | ICD-10-CM | POA: Insufficient documentation

## 2018-05-28 DIAGNOSIS — L309 Dermatitis, unspecified: Secondary | ICD-10-CM | POA: Diagnosis not present

## 2018-05-28 DIAGNOSIS — L900 Lichen sclerosus et atrophicus: Secondary | ICD-10-CM | POA: Diagnosis not present

## 2018-06-11 ENCOUNTER — Ambulatory Visit: Payer: BLUE CROSS/BLUE SHIELD | Admitting: Pulmonary Disease

## 2018-07-27 ENCOUNTER — Telehealth: Payer: Self-pay | Admitting: *Deleted

## 2018-07-27 NOTE — Telephone Encounter (Signed)
Copied from [id number]. Topic: Appointment Scheduling - Scheduling Inquiry for Clinic >> Jul 27, 2018  1:03 PM Alanda Slim E wrote: Reason for CRM: Pt has an appt on 5.15.20 and would like a call to change it to virtual visit/ please advise

## 2018-07-27 NOTE — Telephone Encounter (Signed)
Changed appt patient aware.

## 2018-07-30 ENCOUNTER — Ambulatory Visit (INDEPENDENT_AMBULATORY_CARE_PROVIDER_SITE_OTHER): Payer: BLUE CROSS/BLUE SHIELD | Admitting: Family Medicine

## 2018-07-30 ENCOUNTER — Encounter: Payer: Self-pay | Admitting: Family Medicine

## 2018-07-30 ENCOUNTER — Other Ambulatory Visit: Payer: Self-pay

## 2018-07-30 DIAGNOSIS — Z9989 Dependence on other enabling machines and devices: Secondary | ICD-10-CM

## 2018-07-30 DIAGNOSIS — L900 Lichen sclerosus et atrophicus: Secondary | ICD-10-CM | POA: Diagnosis not present

## 2018-07-30 DIAGNOSIS — G4733 Obstructive sleep apnea (adult) (pediatric): Secondary | ICD-10-CM | POA: Diagnosis not present

## 2018-07-30 DIAGNOSIS — R350 Frequency of micturition: Secondary | ICD-10-CM | POA: Insufficient documentation

## 2018-07-30 DIAGNOSIS — K644 Residual hemorrhoidal skin tags: Secondary | ICD-10-CM | POA: Diagnosis not present

## 2018-07-30 DIAGNOSIS — K219 Gastro-esophageal reflux disease without esophagitis: Secondary | ICD-10-CM

## 2018-07-30 LAB — POCT URINALYSIS DIPSTICK
Bilirubin, UA: NEGATIVE
Glucose, UA: NEGATIVE
Ketones, UA: NEGATIVE
Leukocytes, UA: NEGATIVE
Nitrite, UA: NEGATIVE
Protein, UA: NEGATIVE
Spec Grav, UA: 1.02 (ref 1.010–1.025)
Urobilinogen, UA: 1 E.U./dL
pH, UA: 6 (ref 5.0–8.0)

## 2018-07-30 NOTE — Assessment & Plan Note (Signed)
Possibly related to a UTI. Patients UA is not overly convincing for UTI and we will send for culture and micro prior to initiating treatment.

## 2018-07-30 NOTE — Assessment & Plan Note (Signed)
Continue protonix  

## 2018-07-30 NOTE — Progress Notes (Signed)
Virtual Visit via video Note  This visit type was conducted due to national recommendations for restrictions regarding the COVID-19 pandemic (e.g. social distancing).  This format is felt to be most appropriate for this patient at this time.  All issues noted in this document were discussed and addressed.  No physical exam was performed (except for noted visual exam findings with Video Visits).   I connected with Linda Henson today at  8:00 AM EDT by a video enabled telemedicine application and verified that I am speaking with the correct person using two identifiers. Location patient: home Location provider: work Persons participating in the virtual visit: patient, provider  I discussed the limitations, risks, security and privacy concerns of performing an evaluation and management service by telephone and the availability of in person appointments. I also discussed with the patient that there may be a patient responsible charge related to this service. The patient expressed understanding and agreed to proceed.  Reason for visit: f/u  HPI: OSA: Using CPAP nightly.  Using it for 8 hours.  She notes hypersomnia has improved.  She does not wake up well rested.  She does wake up frequently at night as she feels like the pressure has increased and she has stopped breathing.  GERD: Taking Protonix.  Rarely has reflux if she eats chocolate.  Otherwise no symptoms.  No abdominal pain.  Once monthly she will have blood with a bowel movement after having to strain with constipation.  This is related to a hemorrhoid.  She has seen GI and general surgery for that.  No dysphagia.  Urine frequency: Patient notes she has had some increased frequency.  She notes her urine smells stronger and looks cloudy.  No dysuria.  She has chronic urgency with some incontinence at times.  No hematuria.  No vaginal discharge.  Lichen sclerosis: Patient was diagnosis this through GYN.  Also found to have lichen  sclerosis of her rectum.  She is using clobetasol when it flares.  She is following with dermatology now for this.   ROS: See pertinent positives and negatives per HPI.  Past Medical History:  Diagnosis Date  . Colon polyp    Repeat colonoscopy 2018  . Diverticulitis   . GERD (gastroesophageal reflux disease)   . Hyperlipidemia    Borderline   . Iron deficiency anemia 2014  . Sleep apnea     Past Surgical History:  Procedure Laterality Date  . ABDOMINAL HYSTERECTOMY  1994  . COLONOSCOPY WITH PROPOFOL N/A 02/05/2015   Procedure: COLONOSCOPY WITH PROPOFOL;  Surgeon: Josefine Class, MD;  Location: [facility] ENDOSCOPY;  Service: Endoscopy;  Laterality: N/A;  . EYE SURGERY    . SALPINGOOPHORECTOMY Right 1994    Family History  Problem Relation Age of Onset  . Heart disease Father 30       CAD - died of MI  . Hyperlipidemia Father   . Cancer Brother 41       stomach and esophageal cancer  . Hypothyroidism Sister   . Diabetes Paternal Grandmother   . Breast cancer Neg Hx     SOCIAL HX: Former smoker   Current Outpatient Medications:  .  clobetasol ointment (TEMOVATE) 0.05 %, Apply to affected area twice daily x 1 week, at night x 1 week, Monday-Wednesday-Friday HS x 1 week, then twice weekly HS, Disp: 60 g, Rfl: 0 .  pantoprazole (PROTONIX) 40 MG tablet, Take 1 tablet (40 mg total) by mouth daily., Disp: 90 tablet, Rfl: 1  EXAM:  VITALS per patient if applicable: None.  GENERAL: alert, oriented, appears well and in no acute distress  HEENT: atraumatic, conjunttiva clear, no obvious abnormalities on inspection of external nose and ears  NECK: normal movements of the head and neck  LUNGS: on inspection no signs of respiratory distress, breathing rate appears normal, no obvious gross SOB, gasping or wheezing  CV: no obvious cyanosis  MS: moves all visible extremities without noticeable abnormality  PSYCH/NEURO: pleasant and cooperative, no obvious depression or  anxiety, speech and thought processing grossly intact  ASSESSMENT AND PLAN:  Discussed the following assessment and plan:  Urinary frequency - Plan: POCT Urinalysis Dipstick, POCT Urinalysis Dipstick, Urine Culture, Urine Microscopic Only  OSA on CPAP  External hemorrhoids  Lichen sclerosus et atrophicus  Gastroesophageal reflux disease, esophagitis presence not specified  OSA on CPAP We will place referral to pulmonology for follow-up on her sleep apnea.  External hemorrhoids Rare bleeding. Patient will monitor. Has seen GI and surgery.   Lichen sclerosus et atrophicus She will continue to see dermatology.   GERD (gastroesophageal reflux disease) Continue protonix.  Urinary frequency Possibly related to a UTI. Patients UA is not overly convincing for UTI and we will send for culture and micro prior to initiating treatment.   CMA will contact patient to schedule CPE in 6 months.  Social distancing precautions and sick precautions given regarding COVID-19.   I discussed the assessment and treatment plan with the patient. The patient was provided an opportunity to ask questions and all were answered. The patient agreed with the plan and demonstrated an understanding of the instructions.   The patient was advised to call back or seek an in-person evaluation if the symptoms worsen or if the condition fails to improve as anticipated.   Tommi Rumps, MD

## 2018-07-30 NOTE — Assessment & Plan Note (Signed)
We will place referral to pulmonology for follow-up on her sleep apnea.

## 2018-07-30 NOTE — Assessment & Plan Note (Signed)
She will continue to see dermatology. 

## 2018-07-30 NOTE — Addendum Note (Signed)
Addended by: Leeanne Rio on: 07/30/2018 02:12 PM   Modules accepted: Orders

## 2018-07-30 NOTE — Assessment & Plan Note (Signed)
Rare bleeding. Patient will monitor. Has seen GI and surgery.

## 2018-07-31 LAB — URINALYSIS, MICROSCOPIC ONLY
Bacteria, UA: NONE SEEN /HPF
Hyaline Cast: NONE SEEN /LPF
RBC / HPF: NONE SEEN /HPF (ref 0–2)
Squamous Epithelial / LPF: NONE SEEN /HPF (ref <=5)
WBC, UA: NONE SEEN /HPF (ref 0–5)

## 2018-07-31 LAB — URINE CULTURE
MICRO NUMBER:: 478909
SPECIMEN QUALITY:: ADEQUATE

## 2018-08-03 DIAGNOSIS — G4733 Obstructive sleep apnea (adult) (pediatric): Secondary | ICD-10-CM | POA: Diagnosis not present

## 2018-08-12 ENCOUNTER — Other Ambulatory Visit: Payer: Self-pay | Admitting: Family Medicine

## 2018-08-12 DIAGNOSIS — R350 Frequency of micturition: Secondary | ICD-10-CM

## 2018-09-02 ENCOUNTER — Ambulatory Visit: Payer: BLUE CROSS/BLUE SHIELD | Admitting: Pulmonary Disease

## 2018-09-10 DIAGNOSIS — L900 Lichen sclerosus et atrophicus: Secondary | ICD-10-CM | POA: Diagnosis not present

## 2018-09-22 ENCOUNTER — Other Ambulatory Visit: Payer: Self-pay

## 2018-09-22 ENCOUNTER — Telehealth: Payer: Self-pay | Admitting: *Deleted

## 2018-09-22 ENCOUNTER — Ambulatory Visit (INDEPENDENT_AMBULATORY_CARE_PROVIDER_SITE_OTHER): Payer: BC Managed Care – PPO | Admitting: Urology

## 2018-09-22 ENCOUNTER — Encounter: Payer: Self-pay | Admitting: Urology

## 2018-09-22 VITALS — BP 148/85 | HR 68 | Ht 65.0 in | Wt 213.0 lb

## 2018-09-22 DIAGNOSIS — N3941 Urge incontinence: Secondary | ICD-10-CM

## 2018-09-22 DIAGNOSIS — N952 Postmenopausal atrophic vaginitis: Secondary | ICD-10-CM

## 2018-09-22 DIAGNOSIS — R350 Frequency of micturition: Secondary | ICD-10-CM

## 2018-09-22 DIAGNOSIS — N393 Stress incontinence (female) (male): Secondary | ICD-10-CM | POA: Diagnosis not present

## 2018-09-22 LAB — URINALYSIS, COMPLETE
Bilirubin, UA: NEGATIVE
Glucose, UA: NEGATIVE
Ketones, UA: NEGATIVE
Leukocytes,UA: NEGATIVE
Nitrite, UA: NEGATIVE
Protein,UA: NEGATIVE
Specific Gravity, UA: 1.02 (ref 1.005–1.030)
Urobilinogen, Ur: 0.2 mg/dL (ref 0.2–1.0)
pH, UA: 5.5 (ref 5.0–7.5)

## 2018-09-22 LAB — MICROSCOPIC EXAMINATION
RBC: NONE SEEN /hpf (ref 0–2)
WBC, UA: NONE SEEN /hpf (ref 0–5)

## 2018-09-22 LAB — BLADDER SCAN AMB NON-IMAGING

## 2018-09-22 MED ORDER — PREMARIN 0.625 MG/GM VA CREA: 1.0000 | TOPICAL_CREAM | Freq: Every day | VAGINAL | 12 refills | Status: DC

## 2018-09-22 MED ORDER — MIRABEGRON ER 25 MG PO TB24: 25.0000 mg | ORAL_TABLET | Freq: Every day | ORAL | 11 refills | Status: DC

## 2018-09-22 NOTE — Patient Instructions (Signed)

## 2018-09-22 NOTE — Telephone Encounter (Signed)
Left message for patient to notify them that it is time to schedule annual low dose lung cancer screening CT scan. Instructed patient to call back to verify information prior to the scan being scheduled.  

## 2018-09-22 NOTE — Progress Notes (Signed)
09/22/2018 3:15 PM   Linda Henson 20-Oct-1955 [id number]  Referring provider: Leone Haven, MD [place],  [place] [address]  Chief Complaint  Patient presents with   Urinary Frequency    New Patient    HPI: 63 year old female who presents today for further evaluation of increased urinary frequency and urgency.  She notes that over the past several years, she is having worsening urinary frequency and urgency.  She also occasionally has stronger smelling or cloudy urine.  She has no associated burning, blood, or UTIs.  She does engage in toilet mapping.  She is very urinary incontinence of large volume.  She does have some mild baseline stress urinary incontinence in addition to this with laughing coughing and sneezing.  No exacerbating or alleviating factors.  She was diagnosed last year with lichen sclerosis and uses clobetasol during flares.  She not currently sexually active.  No vaginal bulging or pressure.  She does have a history of sleep apnea.  She gets up a couple times a night to void.  She does wear CPAP.  She is not currently on any OAB meds.  She is never tried these medications in the past.  He is not diabetic.  No lower extremity edema.   PMH: Past Medical History:  Diagnosis Date   Colon polyp    Repeat colonoscopy 2018   Diverticulitis    GERD (gastroesophageal reflux disease)    Hyperlipidemia    Borderline    Iron deficiency anemia 2014   Sleep apnea     Surgical History: Past Surgical History:  Procedure Laterality Date   ABDOMINAL HYSTERECTOMY  1994   COLONOSCOPY WITH PROPOFOL N/A 02/05/2015   Procedure: COLONOSCOPY WITH PROPOFOL;  Surgeon: Josefine Class, MD;  Location: [facility] [place] ENDOSCOPY;  Service: Endoscopy;  Laterality: N/A;   EYE SURGERY     SALPINGOOPHORECTOMY Right 1994    Home Medications:  Allergies as of 09/22/2018   No Known Allergies     Medication List       Accurate as of  September 22, 2018 11:59 PM. If you have any questions, ask your nurse or doctor.        clobetasol ointment 0.05 % Commonly known as: TEMOVATE Apply to affected area twice daily x 1 week, at night x 1 week, Monday-Wednesday-Friday HS x 1 week, then twice weekly HS   mirabegron ER 25 MG Tb24 tablet Commonly known as: MYRBETRIQ Take 1 tablet (25 mg total) by mouth daily. Started by: Hollice Espy, MD   pantoprazole 40 MG tablet Commonly known as: PROTONIX Take 1 tablet (40 mg total) by mouth daily.   Premarin vaginal cream Generic drug: conjugated estrogens Place 1 Applicatorful vaginally daily. Use pea sized amount M-W-Fr before bedtime Started by: Hollice Espy, MD       Allergies: No Known Allergies  Family History: Family History  Problem Relation Age of Onset   Heart disease Father 80       CAD - died of MI   Hyperlipidemia Father    Cancer Brother 81       stomach and esophageal cancer   Hypothyroidism Sister    Diabetes Paternal Grandmother    Breast cancer Neg Hx     Social History:  reports that she quit smoking about 25 years ago. She quit after 4.00 years of use. She has never used smokeless tobacco. She reports current alcohol use of about 7.0 standard drinks of alcohol per week. She reports  that she does not use drugs.  ROS: UROLOGY Frequent Urination?: Yes Hard to postpone urination?: No Burning/pain with urination?: No Get up at night to urinate?: Yes Leakage of urine?: Yes Urine stream starts and stops?: Yes Trouble starting stream?: No Do you have to strain to urinate?: No Blood in urine?: Yes Urinary tract infection?: No Sexually transmitted disease?: No Injury to kidneys or bladder?: No Painful intercourse?: No Weak stream?: Yes Currently pregnant?: No Vaginal bleeding?: No Last menstrual period?: n  Gastrointestinal Nausea?: No Vomiting?: No Indigestion/heartburn?: No Diarrhea?: No Constipation?: No  Constitutional Fever:  No Night sweats?: Yes Weight loss?: No Fatigue?: No  Skin Skin rash/lesions?: No Itching?: No  Eyes Blurred vision?: No Double vision?: No  Ears/Nose/Throat Sore throat?: No Sinus problems?: No  Hematologic/Lymphatic Swollen glands?: No Easy bruising?: No  Cardiovascular Leg swelling?: Yes Chest pain?: No  Respiratory Cough?: No Shortness of breath?: No  Endocrine Excessive thirst?: No  Musculoskeletal Back pain?: Yes Joint pain?: No  Neurological Headaches?: No Dizziness?: No  Psychologic Depression?: No Anxiety?: No  Physical Exam: BP (!) 148/85    Pulse 68    Ht 5\' 5"  (1.651 m)    Wt 213 lb (96.6 kg)    BMI 35.45 kg/m   Constitutional:  Alert and oriented, No acute distress. HEENT:  AT, moist mucus membranes.  Trachea midline, no masses. Cardiovascular: No clubbing, cyanosis, or edema. Respiratory: Normal respiratory effort, no increased work of breathing. GI: Abdomen is soft, nontender, nondistended, no abdominal masses GU: No CVA tenderness Pelvic: Normal external genitalia.  Lichen sclerosus not particularly appreciated today.  Mild vaginal atrophy appreciated.  No significant pelvic organ prolapse.  Normal urethral meatus. Skin: No rashes, bruises or suspicious lesions. Neurologic: Grossly intact, no focal deficits, moving all 4 extremities. Psychiatric: Normal mood and affect.  Laboratory Data: Lab Results  Component Value Date   WBC 8.5 01/29/2018   HGB 14.4 01/29/2018   HCT 40.8 01/29/2018   MCV 97.0 01/29/2018   PLT 192.0 01/29/2018    Lab Results  Component Value Date   CREATININE 0.79 01/29/2018    Lab Results  Component Value Date   HGBA1C 5.6 01/29/2018    Urinalysis Results for orders placed or performed in visit on 09/22/18  Microscopic Examination   URINE  Result Value Ref Range   WBC, UA None seen 0 - 5 /hpf   RBC None seen 0 - 2 /hpf   Epithelial Cells (non renal) 0-10 0 - 10 /hpf   Bacteria, UA Moderate (A)  None seen/Few  Urinalysis, Complete  Result Value Ref Range   Specific Gravity, UA 1.020 1.005 - 1.030   pH, UA 5.5 5.0 - 7.5   Color, UA Yellow Yellow   Appearance Ur Cloudy (A) Clear   Leukocytes,UA Negative Negative   Protein,UA Negative Negative/Trace   Glucose, UA Negative Negative   Ketones, UA Negative Negative   RBC, UA 1+ (A) Negative   Bilirubin, UA Negative Negative   Urobilinogen, Ur 0.2 0.2 - 1.0 mg/dL   Nitrite, UA Negative Negative   Microscopic Examination See below:   BLADDER SCAN AMB NON-IMAGING  Result Value Ref Range   Scan Result 53ml      Assessment & Plan:    1. Urinary frequency No evidence of microscopic blood or any other warning symptoms Suspect OAB as underlying pathology Adequate bladder emptying Pelvic exam fairly benign today Behavioral modification discussed She was given 1 month of Myrbetriq 25 mg as a trial, discussed  possible side effects We will reassess her symptoms in about 3 months She understands this is often a trial and error process to find an appropriate medication  - Urinalysis, Complete - BLADDER SCAN AMB NON-IMAGING  2. Urge incontinence As above  3. Stress incontinence, female Discussed that stress urinary continence is a physical issue related to pelvic support Weight loss and pelvic floor exercises can be helpful Alternatives include surgical intervention, not currently interested in this  4. Atrophic vaginitis Discussed the role of topical estrogen cream which may help with urethral support as well as atrophy around her urethra She will apply pea-sized amount per urethral meatus 3 times a week Understands that this can help with UTI prevention and postmenopausal women Risks discussed-very low systemic absorption   Return in about 3 months (around 12/23/2018) for recheck urinary symptoms.  Hollice Espy, MD  [facility] [address] [place], [place] [address] [phone]

## 2018-09-23 ENCOUNTER — Telehealth: Payer: Self-pay | Admitting: *Deleted

## 2018-09-23 DIAGNOSIS — Z122 Encounter for screening for malignant neoplasm of respiratory organs: Secondary | ICD-10-CM

## 2018-09-23 DIAGNOSIS — Z87891 Personal history of nicotine dependence: Secondary | ICD-10-CM

## 2018-09-23 NOTE — Telephone Encounter (Signed)
Patient has been notified that annual lung cancer screening low dose CT scan is due currently or will be in near future. Confirmed that patient is within the age range of 55-77, and asymptomatic, (no signs or symptoms of lung cancer). Patient denies illness that would prevent curative treatment for lung cancer if found. Verified smoking history, (current, 52.10 pack year). The shared decision making visit was done 09/02/16. Patient is agreeable for CT scan being scheduled.

## 2018-09-29 ENCOUNTER — Ambulatory Visit
Admission: RE | Admit: 2018-09-29 | Discharge: 2018-09-29 | Disposition: A | Discharge status: Home / Self Care | Payer: BC Managed Care – PPO | Source: Ambulatory Visit | Attending: Nurse Practitioner | Admitting: Nurse Practitioner

## 2018-09-29 ENCOUNTER — Other Ambulatory Visit: Payer: Self-pay

## 2018-09-29 ENCOUNTER — Encounter: Payer: Self-pay | Admitting: *Deleted

## 2018-09-29 DIAGNOSIS — Z87891 Personal history of nicotine dependence: Secondary | ICD-10-CM

## 2018-09-29 DIAGNOSIS — Z122 Encounter for screening for malignant neoplasm of respiratory organs: Secondary | ICD-10-CM | POA: Diagnosis not present

## 2018-09-29 DIAGNOSIS — F17210 Nicotine dependence, cigarettes, uncomplicated: Secondary | ICD-10-CM | POA: Diagnosis not present

## 2018-11-02 DIAGNOSIS — G4733 Obstructive sleep apnea (adult) (pediatric): Secondary | ICD-10-CM | POA: Diagnosis not present

## 2018-12-22 ENCOUNTER — Ambulatory Visit: Payer: BC Managed Care – PPO | Admitting: Urology

## 2019-01-10 DIAGNOSIS — H2513 Age-related nuclear cataract, bilateral: Secondary | ICD-10-CM | POA: Diagnosis not present

## 2019-01-12 ENCOUNTER — Ambulatory Visit (INDEPENDENT_AMBULATORY_CARE_PROVIDER_SITE_OTHER): Payer: BC Managed Care – PPO | Admitting: Urology

## 2019-01-12 ENCOUNTER — Encounter: Payer: Self-pay | Admitting: Urology

## 2019-01-12 ENCOUNTER — Other Ambulatory Visit: Payer: Self-pay

## 2019-01-12 VITALS — BP 152/92 | HR 75 | Ht 65.0 in | Wt 215.0 lb

## 2019-01-12 DIAGNOSIS — N952 Postmenopausal atrophic vaginitis: Secondary | ICD-10-CM

## 2019-01-12 DIAGNOSIS — R350 Frequency of micturition: Secondary | ICD-10-CM

## 2019-01-12 DIAGNOSIS — N393 Stress incontinence (female) (male): Secondary | ICD-10-CM

## 2019-01-12 MED ORDER — MIRABEGRON ER 50 MG PO TB24: 50.0000 mg | ORAL_TABLET | Freq: Every day | ORAL | 11 refills | Status: DC

## 2019-01-12 NOTE — Progress Notes (Signed)
01/12/2019 9:14 AM   Linda Henson [birth year] [id number]  Referring provider: Leone Haven, MD [address] [place],  [place] [address]  Chief Complaint  Patient presents with  . Follow-up    HPI: 63 year old female with a personal history of mixed urinary incontinence returns for follow-up.  At last visit, she was prescribed topical estrogen cream as well as given samples of Myrbetriq 25 mg which she took for a month.  She did noticed some improvement in her daytime urinary urgency and frequency.  She believes that she could be better but there is definitely a difference when taking the medication.  No side effects, well-tolerated.  She does note that she has been working on weight loss.  She is drinking lots of water as part of her weight loss regimen which is also contributing to her urinary symptoms.  No dysuria or gross hematuria.   PMH: Past Medical History:  Diagnosis Date  . Colon polyp    Repeat colonoscopy 2018  . Diverticulitis   . GERD (gastroesophageal reflux disease)   . Hyperlipidemia    Borderline   . Iron deficiency anemia 2014  . Sleep apnea     Surgical History: Past Surgical History:  Procedure Laterality Date  . ABDOMINAL HYSTERECTOMY  1994  . COLONOSCOPY WITH PROPOFOL N/A 02/05/2015   Procedure: COLONOSCOPY WITH PROPOFOL;  Surgeon: Josefine Class, MD;  Location: [facility] ENDOSCOPY;  Service: Endoscopy;  Laterality: N/A;  . EYE SURGERY    . SALPINGOOPHORECTOMY Right 1994    Home Medications:  Allergies as of 01/12/2019   No Known Allergies     Medication List       Accurate as of January 12, 2019  9:14 AM. If you have any questions, ask your nurse or doctor.        clobetasol ointment 0.05 % Commonly known as: TEMOVATE Apply to affected area twice daily x 1 week, at night x 1 week, Monday-Wednesday-Friday HS x 1 week, then twice weekly HS   mirabegron ER 25 MG Tb24 tablet Commonly known as: MYRBETRIQ Take  1 tablet (25 mg total) by mouth daily.   pantoprazole 40 MG tablet Commonly known as: PROTONIX Take 1 tablet (40 mg total) by mouth daily.   Premarin vaginal cream Generic drug: conjugated estrogens Place 1 Applicatorful vaginally daily. Use pea sized amount M-W-Fr before bedtime       Allergies: No Known Allergies  Family History: Family History  Problem Relation Age of Onset  . Heart disease Father 35       CAD - died of MI  . Hyperlipidemia Father   . Cancer Brother 41       stomach and esophageal cancer  . Hypothyroidism Sister   . Diabetes Paternal Grandmother   . Breast cancer Neg Hx     Social History:  reports that she quit smoking about 26 years ago. She quit after 4.00 years of use. She has never used smokeless tobacco. She reports current alcohol use of about 7.0 standard drinks of alcohol per week. She reports that she does not use drugs.  ROS: UROLOGY Frequent Urination?: Yes Hard to postpone urination?: No Burning/pain with urination?: No Get up at night to urinate?: No Leakage of urine?: Yes Urine stream starts and stops?: No Trouble starting stream?: No Do you have to strain to urinate?: No Blood in urine?: No Urinary tract infection?: No Sexually transmitted disease?: No Injury to kidneys or bladder?: No Painful intercourse?: No Weak  stream?: No Currently pregnant?: No Vaginal bleeding?: No Last menstrual period?: n  Gastrointestinal Nausea?: No Vomiting?: No Indigestion/heartburn?: No Diarrhea?: No Constipation?: No  Constitutional Fever: No Night sweats?: No Weight loss?: No Fatigue?: No  Skin Skin rash/lesions?: No Itching?: No  Eyes Blurred vision?: No Double vision?: No  Ears/Nose/Throat Sore throat?: No Sinus problems?: No  Hematologic/Lymphatic Swollen glands?: No Easy bruising?: No  Cardiovascular Leg swelling?: No Chest pain?: No  Respiratory Cough?: No Shortness of breath?: No  Endocrine Excessive  thirst?: No  Musculoskeletal Back pain?: No Joint pain?: No  Neurological Headaches?: No Dizziness?: No  Psychologic Depression?: No Anxiety?: No  Physical Exam: BP (!) 152/92   Pulse 75   Ht 5\' 5"  (1.651 m)   Wt 215 lb (97.5 kg)   BMI 35.78 kg/m   Constitutional:  Alert and oriented, No acute distress. HEENT: [place] AT, moist mucus membranes.  Trachea midline, no masses. Cardiovascular: No clubbing, cyanosis, or edema. Respiratory: Normal respiratory effort, no increased work of breathing. Skin: No rashes, bruises or suspicious lesions. Neurologic: Grossly intact, no focal deficits, moving all 4 extremities. Psychiatric: Normal mood and affect.  Laboratory Data: Lab Results  Component Value Date   WBC 8.5 01/29/2018   HGB 14.4 01/29/2018   HCT 40.8 01/29/2018   MCV 97.0 01/29/2018   PLT 192.0 01/29/2018    Lab Results  Component Value Date   CREATININE 0.79 01/29/2018    Lab Results  Component Value Date   HGBA1C 5.6 01/29/2018     Assessment & Plan:    1. Urinary frequency/ urge incontinence Some improvement with Myrbetriq 25 mg, will push dose to 50 mg given absence of side effects She is agreeable this plan She will let us know if she has any issues with the medication or her symptoms worsen Plan for annual follow-up  2. Stress incontinence, female Continue to work towards weight loss and pelvic floor exercises  3. Atrophic vaginitis Continue topical estrogen cream   Return in about 1 year (around 01/12/2020) for MD follow up with PA.  Hollice Espy, MD  [facility] [address] [place], [place] [address] [phone]

## 2019-02-22 ENCOUNTER — Ambulatory Visit (INDEPENDENT_AMBULATORY_CARE_PROVIDER_SITE_OTHER): Payer: BC Managed Care – PPO | Admitting: Family Medicine

## 2019-02-22 ENCOUNTER — Encounter: Payer: Self-pay | Admitting: Family Medicine

## 2019-02-22 VITALS — BP 143/82 | HR 75 | Temp 98.2°F | Ht 65.0 in | Wt 208.0 lb

## 2019-02-22 DIAGNOSIS — Z130 Encounter for screening for diseases of the blood and blood-forming organs and certain disorders involving the immune mechanism: Secondary | ICD-10-CM | POA: Diagnosis not present

## 2019-02-22 DIAGNOSIS — Z13220 Encounter for screening for lipoid disorders: Secondary | ICD-10-CM

## 2019-02-22 DIAGNOSIS — Z0000 Encounter for general adult medical examination without abnormal findings: Secondary | ICD-10-CM

## 2019-02-22 DIAGNOSIS — Z6834 Body mass index (BMI) 34.0-34.9, adult: Secondary | ICD-10-CM

## 2019-02-22 DIAGNOSIS — E6609 Other obesity due to excess calories: Secondary | ICD-10-CM

## 2019-02-22 DIAGNOSIS — Z1231 Encounter for screening mammogram for malignant neoplasm of breast: Secondary | ICD-10-CM | POA: Diagnosis not present

## 2019-02-22 DIAGNOSIS — Z1329 Encounter for screening for other suspected endocrine disorder: Secondary | ICD-10-CM

## 2019-02-22 NOTE — Progress Notes (Signed)
Virtual Visit via video note  This visit type was conducted due to national recommendations for restrictions regarding the COVID-19 pandemic (e.g. social distancing).  This format is felt to be most appropriate for this patient at this time.  All issues noted in this document were discussed and addressed.  No physical exam was performed (except for noted visual exam findings with Video Visits).   I connected with Linda Henson today at  1:00 PM EST by a video enabled telemedicine application and verified that I am speaking with the correct person using two identifiers. Location patient: home Location provider: home office Persons participating in the virtual visit: patient, provider  I discussed the limitations, risks, security and privacy concerns of performing an evaluation and management service by telephone and the availability of in person appointments. I also discussed with the patient that there may be a patient responsible charge related to this service. The patient expressed understanding and agreed to proceed.   Reason for visit: CPE  HPI: Exercises by golfing once weekly.  She was walking though got out of the habit when she had to put her dog down. Diet: She tries to follow a low carbohydrate diet. Pap smear: Patient is status post total hysterectomy many years ago for endometriosis.  There was no cancer related to this thus she does not need further Pap smears. Colonoscopy 02/05/2015 with 5-year recall.  No polyps noted. Mammogram is due. Tetanus vaccine up-to-date.  Flu vaccine up-to-date.  Patient reports having this done in October.  Shingrix vaccine up-to-date. No family history of breast cancer.  She reports her maternal grandmother had some kind of cancer and she is unsure if it was cervical or ovarian.  No family history of colon cancer. No current tobacco use.  She is a former smoker.  No illicit drug use.  She does drink 2 alcoholic beverages daily.  She does have a  history of hepatitis C that was adequately treated and knows that she should be drinking less.  No illicit drug use. She sees a Pharmacist, community and an ophthalmologist.   ROS: See pertinent positives and negatives per HPI.  Past Medical History:  Diagnosis Date  . Colon polyp    Repeat colonoscopy 2018  . Diverticulitis   . GERD (gastroesophageal reflux disease)   . Hyperlipidemia    Borderline   . Iron deficiency anemia 2014  . Sleep apnea     Past Surgical History:  Procedure Laterality Date  . ABDOMINAL HYSTERECTOMY  1994  . COLONOSCOPY WITH PROPOFOL N/A 02/05/2015   Procedure: COLONOSCOPY WITH PROPOFOL;  Surgeon: Josefine Class, MD;  Location: [facility] ENDOSCOPY;  Service: Endoscopy;  Laterality: N/A;  . EYE SURGERY    . SALPINGOOPHORECTOMY Right 1994    Family History  Problem Relation Age of Onset  . Heart disease Father 51       CAD - died of MI  . Hyperlipidemia Father   . Cancer Brother 41       stomach and esophageal cancer  . Hypothyroidism Sister   . Diabetes Paternal Grandmother   . Breast cancer Neg Hx     SOCIAL HX: Former smoker   Current Outpatient Medications:  .  clobetasol ointment (TEMOVATE) 0.05 %, Apply to affected area twice daily x 1 week, at night x 1 week, Monday-Wednesday-Friday HS x 1 week, then twice weekly HS, Disp: 60 g, Rfl: 0 .  pantoprazole (PROTONIX) 40 MG tablet, Take 1 tablet (40 mg total) by mouth daily., Disp:  90 tablet, Rfl: 1 .  conjugated estrogens (PREMARIN) vaginal cream, Place 1 Applicatorful vaginally daily. Use pea sized amount M-W-Fr before bedtime (Patient not taking: Reported on 02/22/2019), Disp: 42.5 g, Rfl: 12 .  mirabegron ER (MYRBETRIQ) 50 MG TB24 tablet, Take 1 tablet (50 mg total) by mouth daily. (Patient not taking: Reported on 02/22/2019), Disp: 30 tablet, Rfl: 11  EXAM:  VITALS per patient if applicable: None  GENERAL: alert, oriented, appears well and in no acute distress  HEENT: atraumatic, conjunttiva clear,  no obvious abnormalities on inspection of external nose and ears  NECK: normal movements of the head and neck  LUNGS: on inspection no signs of respiratory distress, breathing rate appears normal, no obvious gross SOB, gasping or wheezing  CV: no obvious cyanosis  MS: moves all visible extremities without noticeable abnormality  PSYCH/NEURO: pleasant and cooperative, no obvious depression or anxiety, speech and thought processing grossly intact  ASSESSMENT AND PLAN:  Discussed the following assessment and plan:  Routine general medical examination at a health care facility Physical exam completed.  She will call to schedule mammogram.  Encouraged monitoring diet and increasing exercise.  I encouraged her to confirm what kind of cancer her maternal grandmother had as she could potentially benefit from discussion with GYN regarding genetic screening if it was ovarian cancer.  I encouraged her to decrease her alcohol intake and ideally not take in any alcohol given her history of hepatitis C.  She will come in for lab work.    I discussed the assessment and treatment plan with the patient. The patient was provided an opportunity to ask questions and all were answered. The patient agreed with the plan and demonstrated an understanding of the instructions.   The patient was advised to call back or seek an in-person evaluation if the symptoms worsen or if the condition fails to improve as anticipated.   Tommi Rumps, MD

## 2019-02-24 NOTE — Assessment & Plan Note (Signed)
Physical exam completed.  She will call to schedule mammogram.  Encouraged monitoring diet and increasing exercise.  I encouraged her to confirm what kind of cancer her maternal grandmother had as she could potentially benefit from discussion with GYN regarding genetic screening if it was ovarian cancer.  I encouraged her to decrease her alcohol intake and ideally not take in any alcohol given her history of hepatitis C.  She will come in for lab work.

## 2019-02-28 DIAGNOSIS — G4733 Obstructive sleep apnea (adult) (pediatric): Secondary | ICD-10-CM | POA: Diagnosis not present

## 2019-03-04 ENCOUNTER — Other Ambulatory Visit (INDEPENDENT_AMBULATORY_CARE_PROVIDER_SITE_OTHER): Payer: BC Managed Care – PPO

## 2019-03-04 ENCOUNTER — Other Ambulatory Visit: Payer: Self-pay

## 2019-03-04 DIAGNOSIS — Z13220 Encounter for screening for lipoid disorders: Secondary | ICD-10-CM | POA: Diagnosis not present

## 2019-03-04 DIAGNOSIS — E6609 Other obesity due to excess calories: Secondary | ICD-10-CM

## 2019-03-04 DIAGNOSIS — Z6834 Body mass index (BMI) 34.0-34.9, adult: Secondary | ICD-10-CM | POA: Diagnosis not present

## 2019-03-04 DIAGNOSIS — Z1329 Encounter for screening for other suspected endocrine disorder: Secondary | ICD-10-CM

## 2019-03-04 DIAGNOSIS — Z130 Encounter for screening for diseases of the blood and blood-forming organs and certain disorders involving the immune mechanism: Secondary | ICD-10-CM | POA: Diagnosis not present

## 2019-03-04 LAB — COMPREHENSIVE METABOLIC PANEL
ALT: 25 U/L (ref 0–35)
AST: 15 U/L (ref 0–37)
Albumin: 4.4 g/dL (ref 3.5–5.2)
Alkaline Phosphatase: 49 U/L (ref 39–117)
BUN: 12 mg/dL (ref 6–23)
CO2: 27 mEq/L (ref 19–32)
Calcium: 9.4 mg/dL (ref 8.4–10.5)
Chloride: 103 mEq/L (ref 96–112)
Creatinine, Ser: 0.86 mg/dL (ref 0.40–1.20)
GFR: 66.45 mL/min (ref >60.00)
Glucose, Bld: 119 mg/dL — ABNORMAL HIGH (ref 70–99)
Potassium: 3.8 mEq/L (ref 3.5–5.1)
Sodium: 138 mEq/L (ref 135–145)
Total Bilirubin: 0.6 mg/dL (ref 0.2–1.2)
Total Protein: 7.3 g/dL (ref 6.0–8.3)

## 2019-03-04 LAB — CBC
HCT: 41.2 % (ref 36.0–46.0)
Hemoglobin: 14.4 g/dL (ref 12.0–15.0)
MCHC: 35 g/dL (ref 30.0–36.0)
MCV: 98.2 fl (ref 78.0–100.0)
Platelets: 199 10*3/uL (ref 150.0–400.0)
RBC: 4.19 Mil/uL (ref 3.87–5.11)
RDW: 12.7 % (ref 11.5–15.5)
WBC: 8.9 10*3/uL (ref 4.0–10.5)

## 2019-03-04 LAB — LIPID PANEL
Cholesterol: 235 mg/dL — ABNORMAL HIGH (ref 0–200)
HDL: 49.4 mg/dL (ref >39.00)
NonHDL: 185.3
Total CHOL/HDL Ratio: 5
Triglycerides: 209 mg/dL — ABNORMAL HIGH (ref 0.0–149.0)
VLDL: 41.8 mg/dL — ABNORMAL HIGH (ref 0.0–40.0)

## 2019-03-04 LAB — TSH: TSH: 1.62 u[IU]/mL (ref 0.35–4.50)

## 2019-03-04 LAB — HEMOGLOBIN A1C: Hgb A1c MFr Bld: 5.7 % (ref 4.6–6.5)

## 2019-03-04 LAB — LDL CHOLESTEROL, DIRECT: Direct LDL: 136 mg/dL

## 2019-03-15 ENCOUNTER — Ambulatory Visit
Admission: RE | Admit: 2019-03-15 | Discharge: 2019-03-15 | Disposition: A | Discharge status: Home / Self Care | Payer: BC Managed Care – PPO | Source: Ambulatory Visit | Attending: Family Medicine | Admitting: Family Medicine

## 2019-03-15 DIAGNOSIS — Z1231 Encounter for screening mammogram for malignant neoplasm of breast: Secondary | ICD-10-CM | POA: Diagnosis not present

## 2019-03-16 ENCOUNTER — Encounter: Payer: Self-pay | Admitting: Family Medicine

## 2019-03-22 ENCOUNTER — Other Ambulatory Visit: Payer: Self-pay | Admitting: Family Medicine

## 2019-03-23 ENCOUNTER — Other Ambulatory Visit: Payer: BC Managed Care – PPO

## 2019-03-27 ENCOUNTER — Encounter: Payer: Self-pay | Admitting: Family Medicine

## 2019-03-27 DIAGNOSIS — J439 Emphysema, unspecified: Secondary | ICD-10-CM

## 2019-03-27 DIAGNOSIS — M2550 Pain in unspecified joint: Secondary | ICD-10-CM

## 2019-03-28 DIAGNOSIS — J439 Emphysema, unspecified: Secondary | ICD-10-CM | POA: Insufficient documentation

## 2019-04-06 DIAGNOSIS — L738 Other specified follicular disorders: Secondary | ICD-10-CM | POA: Diagnosis not present

## 2019-04-06 DIAGNOSIS — L900 Lichen sclerosus et atrophicus: Secondary | ICD-10-CM | POA: Diagnosis not present

## 2019-04-06 DIAGNOSIS — L821 Other seborrheic keratosis: Secondary | ICD-10-CM | POA: Diagnosis not present

## 2019-04-06 DIAGNOSIS — B079 Viral wart, unspecified: Secondary | ICD-10-CM | POA: Diagnosis not present

## 2019-04-12 DIAGNOSIS — M19041 Primary osteoarthritis, right hand: Secondary | ICD-10-CM | POA: Diagnosis not present

## 2019-04-12 DIAGNOSIS — M791 Myalgia, unspecified site: Secondary | ICD-10-CM | POA: Diagnosis not present

## 2019-04-12 DIAGNOSIS — Z8619 Personal history of other infectious and parasitic diseases: Secondary | ICD-10-CM | POA: Insufficient documentation

## 2019-04-12 DIAGNOSIS — Z13820 Encounter for screening for osteoporosis: Secondary | ICD-10-CM | POA: Diagnosis not present

## 2019-04-12 DIAGNOSIS — M2550 Pain in unspecified joint: Secondary | ICD-10-CM | POA: Insufficient documentation

## 2019-04-12 DIAGNOSIS — M19042 Primary osteoarthritis, left hand: Secondary | ICD-10-CM | POA: Diagnosis not present

## 2019-05-04 DIAGNOSIS — L900 Lichen sclerosus et atrophicus: Secondary | ICD-10-CM | POA: Diagnosis not present

## 2019-05-04 DIAGNOSIS — B079 Viral wart, unspecified: Secondary | ICD-10-CM | POA: Diagnosis not present

## 2019-05-09 DIAGNOSIS — M8949 Other hypertrophic osteoarthropathy, multiple sites: Secondary | ICD-10-CM | POA: Diagnosis not present

## 2019-05-09 DIAGNOSIS — M2550 Pain in unspecified joint: Secondary | ICD-10-CM | POA: Diagnosis not present

## 2019-05-09 DIAGNOSIS — M150 Primary generalized (osteo)arthritis: Secondary | ICD-10-CM | POA: Insufficient documentation

## 2019-05-09 DIAGNOSIS — E559 Vitamin D deficiency, unspecified: Secondary | ICD-10-CM | POA: Diagnosis not present

## 2019-05-09 DIAGNOSIS — Z8619 Personal history of other infectious and parasitic diseases: Secondary | ICD-10-CM | POA: Diagnosis not present

## 2019-06-10 DIAGNOSIS — G4733 Obstructive sleep apnea (adult) (pediatric): Secondary | ICD-10-CM | POA: Diagnosis not present

## 2019-06-24 ENCOUNTER — Ambulatory Visit: Payer: BC Managed Care – PPO | Admitting: Pulmonary Disease

## 2019-07-01 ENCOUNTER — Ambulatory Visit: Payer: BC Managed Care – PPO | Admitting: Internal Medicine

## 2019-07-05 ENCOUNTER — Encounter: Payer: Self-pay | Admitting: Family Medicine

## 2019-07-11 DIAGNOSIS — H25011 Cortical age-related cataract, right eye: Secondary | ICD-10-CM | POA: Diagnosis not present

## 2019-08-04 ENCOUNTER — Other Ambulatory Visit: Payer: Self-pay

## 2019-08-04 ENCOUNTER — Ambulatory Visit (INDEPENDENT_AMBULATORY_CARE_PROVIDER_SITE_OTHER): Payer: BC Managed Care – PPO | Admitting: Pulmonary Disease

## 2019-08-04 ENCOUNTER — Encounter: Payer: Self-pay | Admitting: Pulmonary Disease

## 2019-08-04 VITALS — BP 112/70 | HR 56 | Temp 97.3°F | Ht 65.0 in | Wt 184.6 lb

## 2019-08-04 DIAGNOSIS — G4733 Obstructive sleep apnea (adult) (pediatric): Secondary | ICD-10-CM | POA: Diagnosis not present

## 2019-08-04 DIAGNOSIS — Z9989 Dependence on other enabling machines and devices: Secondary | ICD-10-CM | POA: Diagnosis not present

## 2019-08-04 NOTE — Addendum Note (Signed)
Addended by: Chesley Mires on: 08/04/2019 10:32 AM   Modules accepted: Orders, Level of Service

## 2019-08-04 NOTE — Patient Instructions (Signed)
Will get try to get a copy of your previous sleep study  Will arrange for you to switch to [employer] to get new CPAP supplies  Follow up in 3 months

## 2019-08-04 NOTE — Progress Notes (Addendum)
[facility]  Chief Complaint  Patient presents with  . Follow-up    Patient needs to renew CPAP supplies and is trying to switch DME companies. Patient is doing good with CPAP and has recently lost 30 pounds. Feels good overall    Constitutional:  BP 112/70 (BP Location: Left Arm, Patient Position: Sitting, Cuff Size: Normal)   Pulse (!) 56   Temp (!) 97.3 F (36.3 C) (Temporal)   Ht 5\' 5"  (1.651 m)   Wt 184 lb 9.6 oz (83.7 kg)   SpO2 95%   BMI 30.72 kg/m   Past Medical History:  Osteoporosis, Hep C, Colon polyp, Diverticulosis, GERD, PUD, Iron deficiency anemia, HLD  Summary:  Linda Henson is a 63 y.o. female former smoker with obstructive sleep apnea and abnormal CT chest.  Subjective:   She had sleep study in 2017.  Started on CPAP.  Uses nightly.  Has been using [employer].  They have been giving her run-around regarding billing, and she would like to switch to [place].  She goes to bed at 9 pm and wakes up at 430 am.  Uses bathroom once per night.  Takes a 15 minute power nap during the day.  Isn't using anything to help her sleep or stay awake.  She denies sleep walking, sleep talking, bruxism, or nightmares.  There is no history of restless legs.  She denies sleep hallucinations, sleep paralysis, or cataplexy.  She started a diet and has lost 30 lbs with plans to lose another 30 lbs.  She quit smoking years ago.  She denies chest congestion, wheeze, cough, sputum.  Doesn't feel like her breathing limits her activity, especially since she lost weight.  She was seen in January 2021 by Dr. Annalee Genta with rheumatology for polyarthralgia.  RF and CCP antibodies negative.  Felt to have osteoarthritis.  She has mild reticular changes and paraseptal emphysema on CT chest that has been stable.  Her PFT from 2019 showed mild to moderate diffusion defect.  Physical Exam:   Appearance - well kempt  ENMT - no sinus tenderness, no nasal  discharge, no oral exudate, Mallampati 3, scalloped tongue  Respiratory - no wheeze, or rales  CV - regular rate and rhythm, no murmurs  GI - soft, non tender  Lymph - no adenopathy noted in neck  Ext - no edema  Skin - no rashes  Neuro - normal strength, oriented x 3  Psych - normal mood and affect   Assessment/Plan:   Obstructive sleep apnea. - she is compliant with CPAP and reports benefit from therapy - will get copy of her previous sleep study and most recent CPAP download - will change her DME to [facility] - if she continues to lose weight, then she might be able to reassess whether she still needs CPAP  Abnormal CT chest. - she has prior history of cigarette smoking - she has mild reticular changes and paraseptal emphysema on CT imaging that has been stable - her previous PFT showed mild to moderate diffusion defect - she does not have significant respiratory symptoms at this time - she has f/u CT chest scheduled for later this Summer - if there is progression on f/u CT chest, then will need further assessment  A total of 48 minutes addressing patient care on the day of the visit.  Follow up:  Patient Instructions  Will get try to get a copy of your previous sleep study  Will arrange for you  to switch to [employer] to get new CPAP supplies  Follow up in 3 months   Signature:  Chesley Mires, MD [place] Pager: [phone] 08/04/2019, 9:31 AM  Received fax from her current DME.  PSG from 10/01/14 showed severe OSA.  CPAP download shows excellent compliance and great control with auto CPAP.  Chesley Mires, MD [place] Pager - [phone] 08/04/2019, 10:31 AM    Flow Sheet     Pulmonary tests:  PFT 03/26/17 >> FEV1 2.58 (103%), FEV1% 80, TLC 4.21 (83%), DLCO 59%  Chest imaging:  LDCT chest 09/29/18 >> mild subpleural reticulation/ paraseptal emphysema in lung apices, mild subpleural reticulation/dependent ATX  RLL, 1.9 mm LUL, 1.6 mm calcified granuloma LLL, mod HH, fatty liver  Serology:  Labs 04/12/19 >> RA 11.3, CCP 7, CRP 3  Sleep:  PSG 10/01/14 >> AHI 62, SpO2 low 65.2% Auto CPAP 07/05/19 to 08/03/19 >> used on 30 of 30 nights with average 7 hrs 52 min.  Average AHI 1.3 with median CPAP 11 and 95 th percentile CPAP 15 cm H2O.  Medications:   Allergies as of 08/04/2019   No Known Allergies     Medication List       Accurate as of Aug 04, 2019  9:31 AM. If you have any questions, ask your nurse or doctor.        clobetasol ointment 0.05 % Commonly known as: TEMOVATE Apply to affected area twice daily x 1 week, at night x 1 week, Monday-Wednesday-Friday HS x 1 week, then twice weekly HS   mirabegron ER 50 MG Tb24 tablet Commonly known as: MYRBETRIQ Take 1 tablet (50 mg total) by mouth daily.   pantoprazole 40 MG tablet Commonly known as: PROTONIX TAKE 1 TABLET BY MOUTH DAILY   Premarin vaginal cream Generic drug: conjugated estrogens Place 1 Applicatorful vaginally daily. Use pea sized amount M-W-Fr before bedtime       Past Surgical History:  She  has a past surgical history that includes Abdominal hysterectomy (1994); Salpingoophorectomy (Right, 1994); Eye surgery; and Colonoscopy with propofol (N/A, 02/05/2015).  Family History:  Her family history includes Cancer (age of onset: 29) in her brother; Diabetes in her paternal grandmother; Heart disease (age of onset: 66) in her father; Hyperlipidemia in her father; Hypothyroidism in her sister.  Social History:  She  reports that she quit smoking about 26 years ago. She quit after 4.00 years of use. She has never used smokeless tobacco. She reports current alcohol use of about 7.0 standard drinks of alcohol per week. She reports that she does not use drugs.

## 2019-08-09 ENCOUNTER — Encounter: Admission: RE | Payer: Self-pay | Source: Home / Self Care

## 2019-08-09 ENCOUNTER — Ambulatory Visit: Admission: RE | Admit: 2019-08-09 | Payer: BC Managed Care – PPO | Source: Home / Self Care | Admitting: Ophthalmology

## 2019-08-09 SURGERY — PHACOEMULSIFICATION, CATARACT, WITH IOL INSERTION
Anesthesia: Topical | Laterality: Right

## 2019-08-17 DIAGNOSIS — H25011 Cortical age-related cataract, right eye: Secondary | ICD-10-CM | POA: Diagnosis not present

## 2019-08-17 DIAGNOSIS — J449 Chronic obstructive pulmonary disease, unspecified: Secondary | ICD-10-CM | POA: Diagnosis not present

## 2019-08-23 ENCOUNTER — Encounter: Payer: Self-pay | Admitting: Ophthalmology

## 2019-08-23 ENCOUNTER — Other Ambulatory Visit: Payer: Self-pay

## 2019-08-24 ENCOUNTER — Other Ambulatory Visit: Payer: Self-pay

## 2019-08-24 ENCOUNTER — Ambulatory Visit (INDEPENDENT_AMBULATORY_CARE_PROVIDER_SITE_OTHER): Payer: BC Managed Care – PPO | Admitting: Family Medicine

## 2019-08-24 ENCOUNTER — Encounter: Payer: Self-pay | Admitting: Family Medicine

## 2019-08-24 DIAGNOSIS — Z6834 Body mass index (BMI) 34.0-34.9, adult: Secondary | ICD-10-CM

## 2019-08-24 DIAGNOSIS — K219 Gastro-esophageal reflux disease without esophagitis: Secondary | ICD-10-CM | POA: Diagnosis not present

## 2019-08-24 DIAGNOSIS — Z9989 Dependence on other enabling machines and devices: Secondary | ICD-10-CM

## 2019-08-24 DIAGNOSIS — E6609 Other obesity due to excess calories: Secondary | ICD-10-CM | POA: Diagnosis not present

## 2019-08-24 DIAGNOSIS — E785 Hyperlipidemia, unspecified: Secondary | ICD-10-CM | POA: Diagnosis not present

## 2019-08-24 DIAGNOSIS — G4733 Obstructive sleep apnea (adult) (pediatric): Secondary | ICD-10-CM

## 2019-08-24 LAB — LIPID PANEL
Cholesterol: 229 mg/dL — ABNORMAL HIGH (ref 0–200)
HDL: 51.3 mg/dL (ref >39.00)
LDL Cholesterol: 139 mg/dL — ABNORMAL HIGH (ref 0–99)
NonHDL: 177.62
Total CHOL/HDL Ratio: 4
Triglycerides: 194 mg/dL — ABNORMAL HIGH (ref 0.0–149.0)
VLDL: 38.8 mg/dL (ref 0.0–40.0)

## 2019-08-24 NOTE — Assessment & Plan Note (Signed)
Weight has trended down with diet and exercise changes.  She will continue diet and exercise.

## 2019-08-24 NOTE — Assessment & Plan Note (Signed)
Doing well.  She will continue her CPAP.

## 2019-08-24 NOTE — Assessment & Plan Note (Signed)
Very well controlled with dietary changes.  She will continue as needed Protonix.

## 2019-08-24 NOTE — Assessment & Plan Note (Signed)
Check lipid panel.  Encouraged continued diet and exercise.

## 2019-08-24 NOTE — Progress Notes (Signed)
  Tommi Rumps, MD Phone: [phone]  Linda Henson is a 64 y.o. female who presents today for f/u.  GERD:   Reflux symptoms: 2x in the past 3 months   Abd pain: no   Blood in stool: no  Dysphagia: no   EGD: no  Medication: takes protonix prn  Hyperlipidemia: Patient started dieting and exercising.  She has been doing a meal plan where she eats every 2 hours with small meals that are prepared for her.  Lots of soy protein and low carbohydrate meals.  She has also been exercising with walking more and golfing on the weekends.  She does note a little bit more gas with the soy.  No diarrhea.  OSA: Using her CPAP nightly.  No hypersomnia.  She does wake well rested.   Social History   Tobacco Use  Smoking Status Former Smoker  . Years: 4.00  . Quit date: 12/15/1992  . Years since quitting: 26.7  Smokeless Tobacco Never Used     ROS see history of present illness  Objective  Physical Exam Vitals:   08/24/19 1317  BP: 130/70  Pulse: 61  Temp: 97.7 F (36.5 C)  SpO2: 97%    BP Readings from Last 3 Encounters:  08/24/19 130/70  08/04/19 112/70  02/22/19 (!) 143/82   Wt Readings from Last 3 Encounters:  08/24/19 182 lb 12.8 oz (82.9 kg)  08/04/19 184 lb 9.6 oz (83.7 kg)  02/22/19 208 lb (94.3 kg)    Physical Exam Constitutional:      General: She is not in acute distress.    Appearance: She is not diaphoretic.  Cardiovascular:     Rate and Rhythm: Normal rate and regular rhythm.     Heart sounds: Normal heart sounds.  Pulmonary:     Effort: Pulmonary effort is normal.     Breath sounds: Normal breath sounds.  Musculoskeletal:     Right lower leg: No edema.     Left lower leg: No edema.  Skin:    General: Skin is warm and dry.  Neurological:     Mental Status: She is alert.      Assessment/Plan: Please see individual problem list.  OSA on CPAP Doing well.  She will continue her CPAP.  GERD (gastroesophageal reflux disease) Very well  controlled with dietary changes.  She will continue as needed Protonix.  Hyperlipidemia Check lipid panel.  Encouraged continued diet and exercise.  Obesity Weight has trended down with diet and exercise changes.  She will continue diet and exercise.   Orders Placed This Encounter  Procedures  . Lipid panel    No orders of the defined types were placed in this encounter.   This visit occurred during the SARS-CoV-2 public health emergency.  Safety protocols were in place, including screening questions prior to the visit, additional usage of staff PPE, and extensive cleaning of exam room while observing appropriate contact time as indicated for disinfecting solutions.    Tommi Rumps, MD [place]

## 2019-08-24 NOTE — Patient Instructions (Addendum)
Nice to see you. We will check labs.  Continue to work on diet and exercise.

## 2019-08-25 ENCOUNTER — Telehealth: Payer: Self-pay

## 2019-08-25 NOTE — Telephone Encounter (Signed)
-----   Message from Leone Haven, MD sent at 08/24/2019  4:19 PM EDT ----- Please let the patient know that her cholesterol has improved slightly. Her risk percentage is still in the intermediate risk category and she could consider continuing diet and exercise or start on medication. If she would like the medication we can get her started on it, otherwise she should continue with diet and exercise.   The 10-year ASCVD risk score Mikey Bussing DC Brooke Bonito., et al., 2013) is: 5.8%   Values used to calculate the score:     Age: 64 years     Sex: Female     Is Non-Hispanic African American: No     Diabetic: No     Tobacco smoker: No     Systolic Blood Pressure: 435 mmHg     Is BP treated: No     HDL Cholesterol: 51.3 mg/dL     Total Cholesterol: 229 mg/dL

## 2019-08-26 ENCOUNTER — Other Ambulatory Visit
Admission: RE | Admit: 2019-08-26 | Discharge: 2019-08-26 | Disposition: A | Discharge status: Home / Self Care | Payer: BC Managed Care – PPO | Source: Ambulatory Visit | Attending: Ophthalmology | Admitting: Ophthalmology

## 2019-08-26 ENCOUNTER — Other Ambulatory Visit: Payer: Self-pay

## 2019-08-26 DIAGNOSIS — Z20822 Contact with and (suspected) exposure to covid-19: Secondary | ICD-10-CM | POA: Diagnosis not present

## 2019-08-26 DIAGNOSIS — Z01812 Encounter for preprocedural laboratory examination: Secondary | ICD-10-CM | POA: Insufficient documentation

## 2019-08-26 NOTE — Discharge Instructions (Signed)

## 2019-08-27 LAB — SARS CORONAVIRUS 2 (TAT 6-24 HRS): SARS Coronavirus 2: NEGATIVE

## 2019-08-30 ENCOUNTER — Encounter: Payer: Self-pay | Admitting: Ophthalmology

## 2019-08-30 ENCOUNTER — Ambulatory Visit: Payer: BC Managed Care – PPO | Admitting: Anesthesiology

## 2019-08-30 ENCOUNTER — Ambulatory Visit
Admission: RE | Admit: 2019-08-30 | Discharge: 2019-08-30 | Disposition: A | Discharge status: Home / Self Care | Payer: BC Managed Care – PPO | Attending: Ophthalmology | Admitting: Ophthalmology

## 2019-08-30 ENCOUNTER — Encounter
Admission: RE | Disposition: A | Discharge status: Home / Self Care | Payer: Self-pay | Source: Home / Self Care | Attending: Ophthalmology

## 2019-08-30 ENCOUNTER — Other Ambulatory Visit: Payer: Self-pay

## 2019-08-30 DIAGNOSIS — H25011 Cortical age-related cataract, right eye: Secondary | ICD-10-CM | POA: Diagnosis not present

## 2019-08-30 DIAGNOSIS — H2511 Age-related nuclear cataract, right eye: Secondary | ICD-10-CM | POA: Insufficient documentation

## 2019-08-30 DIAGNOSIS — H25811 Combined forms of age-related cataract, right eye: Secondary | ICD-10-CM | POA: Diagnosis not present

## 2019-08-30 DIAGNOSIS — K219 Gastro-esophageal reflux disease without esophagitis: Secondary | ICD-10-CM | POA: Diagnosis not present

## 2019-08-30 DIAGNOSIS — G4730 Sleep apnea, unspecified: Secondary | ICD-10-CM | POA: Insufficient documentation

## 2019-08-30 DIAGNOSIS — J449 Chronic obstructive pulmonary disease, unspecified: Secondary | ICD-10-CM | POA: Diagnosis not present

## 2019-08-30 DIAGNOSIS — Z87891 Personal history of nicotine dependence: Secondary | ICD-10-CM | POA: Diagnosis not present

## 2019-08-30 HISTORY — PX: CATARACT EXTRACTION W/PHACO: SHX586

## 2019-08-30 HISTORY — DX: Chronic obstructive pulmonary disease, unspecified: J44.9

## 2019-08-30 HISTORY — DX: Unspecified osteoarthritis, unspecified site: M19.90

## 2019-08-30 SURGERY — PHACOEMULSIFICATION, CATARACT, WITH IOL INSERTION
Anesthesia: Monitor Anesthesia Care | Site: Eye | Laterality: Right

## 2019-08-30 MED ORDER — ACETAMINOPHEN 10 MG/ML IV SOLN: 1000.0000 mg | Freq: Once | INTRAVENOUS | Status: DC | PRN

## 2019-08-30 MED ORDER — TETRACAINE HCL 0.5 % OP SOLN
1.0000 [drp] | OPHTHALMIC | Status: DC | PRN
  Administered 2019-08-30 (×3): 1 [drp] via OPHTHALMIC

## 2019-08-30 MED ORDER — LIDOCAINE HCL (PF) 2 % IJ SOLN
INTRAOCULAR | Status: DC | PRN
  Administered 2019-08-30: 1 mL

## 2019-08-30 MED ORDER — MIDAZOLAM HCL 2 MG/2ML IJ SOLN
INTRAMUSCULAR | Status: DC | PRN
  Administered 2019-08-30 (×2): 1 mg via INTRAVENOUS

## 2019-08-30 MED ORDER — EPINEPHRINE PF 1 MG/ML IJ SOLN
INTRAOCULAR | Status: DC | PRN
  Administered 2019-08-30: 58 mL via OPHTHALMIC

## 2019-08-30 MED ORDER — FENTANYL CITRATE (PF) 100 MCG/2ML IJ SOLN
INTRAMUSCULAR | Status: DC | PRN
  Administered 2019-08-30 (×2): 50 ug via INTRAVENOUS

## 2019-08-30 MED ORDER — LACTATED RINGERS IV SOLN: 100.0000 mL/h | INTRAVENOUS | Status: DC

## 2019-08-30 MED ORDER — NA CHONDROIT SULF-NA HYALURON 40-17 MG/ML IO SOLN
INTRAOCULAR | Status: DC | PRN
  Administered 2019-08-30: 1 mL via INTRAOCULAR

## 2019-08-30 MED ORDER — BRIMONIDINE TARTRATE-TIMOLOL 0.2-0.5 % OP SOLN
OPHTHALMIC | Status: DC | PRN
  Administered 2019-08-30: 1 [drp] via OPHTHALMIC

## 2019-08-30 MED ORDER — ONDANSETRON HCL 4 MG/2ML IJ SOLN: 4.0000 mg | Freq: Once | INTRAMUSCULAR | Status: DC | PRN

## 2019-08-30 MED ORDER — MOXIFLOXACIN HCL 0.5 % OP SOLN
OPHTHALMIC | Status: DC | PRN
  Administered 2019-08-30: 0.2 mL via OPHTHALMIC

## 2019-08-30 MED ORDER — ARMC OPHTHALMIC DILATING DROPS
1.0000 "application " | OPHTHALMIC | Status: DC | PRN
  Administered 2019-08-30 (×3): 1 via OPHTHALMIC

## 2019-08-30 SURGICAL SUPPLY — 20 items
CANNULA ANT/CHMB 27GA (MISCELLANEOUS) ×4 IMPLANT
GLOVE SURG LX 8.0 MICRO (GLOVE) ×2
GLOVE SURG LX STRL 8.0 MICRO (GLOVE) ×2 IMPLANT
GLOVE SURG TRIUMPH 8.0 PF LTX (GLOVE) ×2 IMPLANT
GOWN STRL REUS W/ TWL LRG LVL3 (GOWN DISPOSABLE) ×2 IMPLANT
GOWN STRL REUS W/TWL LRG LVL3 (GOWN DISPOSABLE) ×4
LENS IOL DIOP 22.0 (Intraocular Lens) ×2 IMPLANT
LENS IOL TECNIS MONO 22.0 (Intraocular Lens) ×1 IMPLANT
MARKER SKIN DUAL TIP RULER LAB (MISCELLANEOUS) ×2 IMPLANT
NEEDLE FILTER BLUNT 18X 1/2SAF (NEEDLE) ×1
NEEDLE FILTER BLUNT 18X1 1/2 (NEEDLE) ×1 IMPLANT
PACK EYE AFTER SURG (MISCELLANEOUS) ×2 IMPLANT
PACK OPTHALMIC (MISCELLANEOUS) ×2 IMPLANT
PACK PORFILIO (MISCELLANEOUS) ×2 IMPLANT
SUT ETHILON 10-0 CS-B-6CS-B-6 (SUTURE)
SUTURE EHLN 10-0 CS-B-6CS-B-6 (SUTURE) IMPLANT
SYR 3ML LL SCALE MARK (SYRINGE) ×2 IMPLANT
SYR TB 1ML LUER SLIP (SYRINGE) ×2 IMPLANT
WATER STERILE IRR 250ML POUR (IV SOLUTION) ×2 IMPLANT
WIPE NON LINTING 3.25X3.25 (MISCELLANEOUS) ×2 IMPLANT

## 2019-08-30 NOTE — Anesthesia Preprocedure Evaluation (Signed)
Anesthesia Evaluation  Patient identified by MRN, date of birth, ID band Patient awake    Reviewed: Allergy & Precautions, NPO status , Patient's Chart, lab work & pertinent test results, reviewed documented beta blocker date and time   History of Anesthesia Complications Negative for: history of anesthetic complications  Airway Mallampati: III  TM Distance: >3 FB Neck ROM: Full    Dental   Pulmonary sleep apnea , COPD, former smoker,    breath sounds clear to auscultation       Cardiovascular (-) angina(-) DOE  Rhythm:Regular Rate:Normal   HLD   Neuro/Psych PSYCHIATRIC DISORDERS Depression    GI/Hepatic GERD  Controlled,(+) Hepatitis -, C Diverticulosis   Endo/Other    Renal/GU      Musculoskeletal  (+) Arthritis ,   Abdominal (+) + obese (BMI 31),   Peds  Hematology  (+) anemia ,   Anesthesia Other Findings   Reproductive/Obstetrics                             Anesthesia Physical Anesthesia Plan  ASA: II  Anesthesia Plan: MAC   Post-op Pain Management:    Induction: Intravenous  PONV Risk Score and Plan: 2 and TIVA, Midazolam and Treatment may vary due to age or medical condition  Airway Management Planned: Nasal Cannula  Additional Equipment:   Intra-op Plan:   Post-operative Plan:   Informed Consent: I have reviewed the patients History and Physical, chart, labs and discussed the procedure including the risks, benefits and alternatives for the proposed anesthesia with the patient or authorized representative who has indicated his/her understanding and acceptance.       Plan Discussed with: CRNA and Anesthesiologist  Anesthesia Plan Comments:         Anesthesia Quick Evaluation

## 2019-08-30 NOTE — Anesthesia Procedure Notes (Signed)
Procedure Name: MAC Date/Time: 08/30/2019 11:14 AM Performed by: Vanetta Shawl, CRNA Pre-anesthesia Checklist: Patient identified, Emergency Drugs available, Suction available, Timeout performed and Patient being monitored Patient Re-evaluated:Patient Re-evaluated prior to induction Oxygen Delivery Method: Nasal cannula Placement Confirmation: positive ETCO2

## 2019-08-30 NOTE — Op Note (Signed)
PREOPERATIVE DIAGNOSIS:  Nuclear sclerotic cataract of the right eye.   POSTOPERATIVE DIAGNOSIS:  Cataract   OPERATIVE PROCEDURE:@   SURGEON:  Birder Robson, MD.   ANESTHESIA:  Anesthesiologist: Heniser, Fredric Dine, MD CRNA: Vanetta Shawl, CRNA; Cameron Ali, CRNA  1.      Managed anesthesia care. 2.      0.22ml of Shugarcaine was instilled in the eye following the paracentesis.   COMPLICATIONS:  None.   TECHNIQUE:   Stop and chop   DESCRIPTION OF PROCEDURE:  The patient was examined and consented in the preoperative holding area where the aforementioned topical anesthesia was applied to the right eye and then brought back to the Operating Room where the right eye was prepped and draped in the usual sterile ophthalmic fashion and a lid speculum was placed. A paracentesis was created with the side port blade and the anterior chamber was filled with viscoelastic. A near clear corneal incision was performed with the steel keratome. A continuous curvilinear capsulorrhexis was performed with a cystotome followed by the capsulorrhexis forceps. Hydrodissection and hydrodelineation were carried out with BSS on a blunt cannula. The lens was removed in a stop and chop  technique and the remaining cortical material was removed with the irrigation-aspiration handpiece. The capsular bag was inflated with viscoelastic and the Technis [id number]  lens was placed in the capsular bag without complication. The remaining viscoelastic was removed from the eye with the irrigation-aspiration handpiece. The wounds were hydrated. The anterior chamber was flushed with BSS and the eye was inflated to physiologic pressure. 0.66ml of Vigamox was placed in the anterior chamber. The wounds were found to be water tight. The eye was dressed with Combigan. The patient was given protective glasses to wear throughout the day and a shield with which to sleep tonight. The patient was also given drops with which to begin a drop regimen  today and will follow-up with me in one day. Implant Name Type Inv. Item Serial No. Manufacturer Lot No. LRB No. Used Action  LENS IOL DIOP 22.0 - [id number] Intraocular Lens LENS IOL DIOP 22.0 [id number] AMO  Right 1 Implanted   Procedure(s) with comments: CATARACT EXTRACTION PHACO AND INTRAOCULAR LENS PLACEMENT (IOC) RIGHT (Right) - sleep apnea  Electronically signed: Birder Robson 08/30/2019 11:32 AM

## 2019-08-30 NOTE — Transfer of Care (Signed)
Immediate Anesthesia Transfer of Care Note  Patient: Linda Henson  Procedure(s) Performed: CATARACT EXTRACTION PHACO AND INTRAOCULAR LENS PLACEMENT (IOC) RIGHT (Right Eye)  Patient Location: PACU  Anesthesia Type: MAC  Level of Consciousness: awake, alert  and patient cooperative  Airway and Oxygen Therapy: Patient Spontanous Breathing and Patient connected to supplemental oxygen  Post-op Assessment: Post-op Vital signs reviewed, Patient's Cardiovascular Status Stable, Respiratory Function Stable, Patent Airway and No signs of Nausea or vomiting  Post-op Vital Signs: Reviewed and stable  Complications: No complications documented.

## 2019-08-30 NOTE — Anesthesia Postprocedure Evaluation (Signed)
Anesthesia Post Note  Patient: Linda Henson  Procedure(s) Performed: CATARACT EXTRACTION PHACO AND INTRAOCULAR LENS PLACEMENT (IOC) RIGHT (Right Eye)     Patient location during evaluation: PACU Anesthesia Type: MAC Level of consciousness: awake and alert Pain management: pain level controlled Vital Signs Assessment: post-procedure vital signs reviewed and stable Respiratory status: spontaneous breathing, nonlabored ventilation, respiratory function stable and patient connected to nasal cannula oxygen Cardiovascular status: stable and blood pressure returned to baseline Postop Assessment: no apparent nausea or vomiting Anesthetic complications: no   No complications documented.  Bridgitte Felicetti A  [place]

## 2019-08-30 NOTE — H&P (Signed)
All labs reviewed. Abnormal studies sent to patients PCP when indicated.  Previous H&P reviewed, patient examined, there are NO CHANGES.  Linda Seminara Porfilio6/15/202111:02 AM

## 2019-08-31 ENCOUNTER — Encounter: Payer: Self-pay | Admitting: Ophthalmology

## 2019-09-19 ENCOUNTER — Ambulatory Visit (INDEPENDENT_AMBULATORY_CARE_PROVIDER_SITE_OTHER): Payer: BC Managed Care – PPO | Admitting: Dermatology

## 2019-09-19 ENCOUNTER — Other Ambulatory Visit: Payer: Self-pay

## 2019-09-19 DIAGNOSIS — D692 Other nonthrombocytopenic purpura: Secondary | ICD-10-CM

## 2019-09-19 DIAGNOSIS — D2372 Other benign neoplasm of skin of left lower limb, including hip: Secondary | ICD-10-CM

## 2019-09-19 DIAGNOSIS — L821 Other seborrheic keratosis: Secondary | ICD-10-CM

## 2019-09-19 DIAGNOSIS — B079 Viral wart, unspecified: Secondary | ICD-10-CM

## 2019-09-19 NOTE — Progress Notes (Signed)
   Follow-Up Visit   Subjective  Linda Henson is a 64 y.o. female who presents for the following: lesion (L foot - irregular, has been there about a month, patient did pick at it and it has since been purple. She is currently using Neosporin daily and was using H2O2 to clean it with but was told by a friend not to use it so she stopped.) and wart (residual on the L frontal scalp/forehead).  Frozen twice in past- still there.  The following portions of the chart were reviewed this encounter and updated as appropriate:     Review of Systems:  No other skin or systemic complaints except as noted in HPI or Assessment and Plan.  Objective  Well appearing patient in no apparent distress; mood and affect are within normal limits.  A focused examination was performed including the left foot, frontal scalp hairline. Relevant physical exam findings are noted in the Assessment and Plan.  Objective  L medial plantar foot, L frontal scalp/forehead (2): 5 mm firm violaceous/brown papule, c/w blood on dermoscopy, removed with paring Small residual verrucous papule mid frontal hairline   Assessment & Plan  Viral warts, unspecified type (2) L medial plantar foot, L frontal scalp/forehead  With associated purpura from picking on the L medial plantar foot, residual wart on the L frontal scalp. Paring performed today on the L medial plantar foot. Cryotherapy today to both. Discussed viral etiology and risk of spread.  Discussed multiple treatments may be required to clear warts.  Discussed possible post-treatment dyspigmentation and risk of recurrence.    Destruction of lesion - L medial plantar foot, L frontal scalp/forehead  Destruction method: cryotherapy   Informed consent: discussed and consent obtained   Lesion destroyed using liquid nitrogen: Yes   Region frozen until ice ball extended beyond lesion: Yes   Outcome: patient tolerated procedure well with no complications   Post-procedure  details: wound care instructions given    Seborrheic Keratoses - Stuck-on, waxy, tan-brown papule L temple - Discussed benign etiology and prognosis. - Observe - Call for any changes  Dermatofibroma - Firm pink/brown papulenodule with dimple sign L pretibia - Benign appearing - Call for any changes  Return for appointment as scheduled with Dr. Laurence Ferrari.  Luther Redo, CMA, am acting as scribe for Brendolyn Patty, MD .  Documentation: I have reviewed the above documentation for accuracy and completeness, and I agree with the above.  Brendolyn Patty MD

## 2019-09-22 DIAGNOSIS — G4733 Obstructive sleep apnea (adult) (pediatric): Secondary | ICD-10-CM | POA: Diagnosis not present

## 2019-09-26 ENCOUNTER — Telehealth: Payer: Self-pay

## 2019-09-26 DIAGNOSIS — Z87891 Personal history of nicotine dependence: Secondary | ICD-10-CM

## 2019-09-26 DIAGNOSIS — Z122 Encounter for screening for malignant neoplasm of respiratory organs: Secondary | ICD-10-CM

## 2019-09-26 NOTE — Telephone Encounter (Signed)
Message left notifying patient that it is time to schedule the low dose lung cancer screening CT scan.  Instructed patient to return call to Shawn Perkins at [phone] to verify information prior to CT scan being scheduled.    

## 2019-09-27 NOTE — Telephone Encounter (Signed)
Patient has been notified that annual lung cancer screening low dose CT scan is due currently or will be in near future. Confirmed that patient is within the age range of 55-77, and asymptomatic, (no signs or symptoms of lung cancer). Patient denies illness that would prevent curative treatment for lung cancer if found. Verified smoking history, (current, 52.2 pack year). The shared decision making visit was done 09/02/16. Patient is agreeable for CT scan being scheduled.

## 2019-09-27 NOTE — Addendum Note (Signed)
Addended by: Lieutenant Diego on: 09/27/2019 10:29 AM   Modules accepted: Orders

## 2019-10-06 ENCOUNTER — Ambulatory Visit
Admission: RE | Admit: 2019-10-06 | Discharge: 2019-10-06 | Disposition: A | Discharge status: Home / Self Care | Payer: BC Managed Care – PPO | Source: Ambulatory Visit | Attending: Oncology | Admitting: Oncology

## 2019-10-06 ENCOUNTER — Other Ambulatory Visit: Payer: Self-pay

## 2019-10-06 DIAGNOSIS — Z122 Encounter for screening for malignant neoplasm of respiratory organs: Secondary | ICD-10-CM

## 2019-10-06 DIAGNOSIS — F17210 Nicotine dependence, cigarettes, uncomplicated: Secondary | ICD-10-CM | POA: Diagnosis not present

## 2019-10-06 DIAGNOSIS — Z87891 Personal history of nicotine dependence: Secondary | ICD-10-CM | POA: Diagnosis not present

## 2019-10-10 ENCOUNTER — Encounter: Payer: Self-pay | Admitting: *Deleted

## 2019-11-03 ENCOUNTER — Encounter: Payer: BC Managed Care – PPO | Admitting: Dermatology

## 2019-11-08 DIAGNOSIS — Z86010 Personal history of colonic polyps: Secondary | ICD-10-CM | POA: Diagnosis not present

## 2019-11-08 DIAGNOSIS — K219 Gastro-esophageal reflux disease without esophagitis: Secondary | ICD-10-CM | POA: Diagnosis not present

## 2019-11-08 DIAGNOSIS — K9049 Malabsorption due to intolerance, not elsewhere classified: Secondary | ICD-10-CM | POA: Diagnosis not present

## 2019-11-11 DIAGNOSIS — Z13820 Encounter for screening for osteoporosis: Secondary | ICD-10-CM | POA: Diagnosis not present

## 2019-11-11 DIAGNOSIS — E559 Vitamin D deficiency, unspecified: Secondary | ICD-10-CM | POA: Diagnosis not present

## 2019-11-11 DIAGNOSIS — M8949 Other hypertrophic osteoarthropathy, multiple sites: Secondary | ICD-10-CM | POA: Diagnosis not present

## 2019-11-18 DIAGNOSIS — M8588 Other specified disorders of bone density and structure, other site: Secondary | ICD-10-CM | POA: Diagnosis not present

## 2019-11-23 ENCOUNTER — Ambulatory Visit (INDEPENDENT_AMBULATORY_CARE_PROVIDER_SITE_OTHER): Payer: BC Managed Care – PPO | Admitting: Dermatology

## 2019-11-23 ENCOUNTER — Other Ambulatory Visit: Payer: Self-pay

## 2019-11-23 DIAGNOSIS — Z1283 Encounter for screening for malignant neoplasm of skin: Secondary | ICD-10-CM

## 2019-11-23 DIAGNOSIS — B079 Viral wart, unspecified: Secondary | ICD-10-CM | POA: Diagnosis not present

## 2019-11-23 DIAGNOSIS — L814 Other melanin hyperpigmentation: Secondary | ICD-10-CM

## 2019-11-23 DIAGNOSIS — L578 Other skin changes due to chronic exposure to nonionizing radiation: Secondary | ICD-10-CM

## 2019-11-23 DIAGNOSIS — D1800 Hemangioma unspecified site: Secondary | ICD-10-CM

## 2019-11-23 DIAGNOSIS — D229 Melanocytic nevi, unspecified: Secondary | ICD-10-CM

## 2019-11-23 DIAGNOSIS — D2361 Other benign neoplasm of skin of right upper limb, including shoulder: Secondary | ICD-10-CM | POA: Diagnosis not present

## 2019-11-23 DIAGNOSIS — L900 Lichen sclerosus et atrophicus: Secondary | ICD-10-CM

## 2019-11-23 DIAGNOSIS — D2362 Other benign neoplasm of skin of left upper limb, including shoulder: Secondary | ICD-10-CM

## 2019-11-23 DIAGNOSIS — D239 Other benign neoplasm of skin, unspecified: Secondary | ICD-10-CM

## 2019-11-23 DIAGNOSIS — L821 Other seborrheic keratosis: Secondary | ICD-10-CM

## 2019-11-23 MED ORDER — CLOBETASOL PROPIONATE 0.05 % EX OINT: 1.0000 "application " | TOPICAL_OINTMENT | Freq: Two times a day (BID) | CUTANEOUS | 1 refills | Status: DC

## 2019-11-23 MED ORDER — IMIQUIMOD 5 % EX CREA: TOPICAL_CREAM | CUTANEOUS | 0 refills | Status: DC

## 2019-11-23 NOTE — Patient Instructions (Addendum)
Melanoma ABCDEs  Melanoma is the most dangerous type of skin cancer, and is the leading cause of death from skin disease.  You are more likely to develop melanoma if you:  Have light-colored skin, light-colored eyes, or red or blond hair  Spend a lot of time in the sun  Tan regularly, either outdoors or in a tanning bed  Have had blistering sunburns, especially during childhood  Have a close family member who has had a melanoma  Have atypical moles or large birthmarks  Early detection of melanoma is key since treatment is typically straightforward and cure rates are extremely high if we catch it early.   The first sign of melanoma is often a change in a mole or a new dark spot.  The ABCDE system is a way of remembering the signs of melanoma.  A for asymmetry:  The two halves do not match. B for border:  The edges of the growth are irregular. C for color:  A mixture of colors are present instead of an even brown color. D for diameter:  Melanomas are usually (but not always) greater than 6mm - the size of a pencil eraser. E for evolution:  The spot keeps changing in size, shape, and color.  Please check your skin once per month between visits. You can use a small mirror in front and a large mirror behind you to keep an eye on the back side or your body.   If you see any new or changing lesions before your next follow-up, please call to schedule a visit.  Please continue daily skin protection including broad spectrum sunscreen SPF 30+ to sun-exposed areas, reapplying every 2 hours as needed when you're outdoors.    Recommend taking Heliocare sun protection supplement daily in sunny weather for additional sun protection. For maximum protection on the sunniest days, you can take up to 2 capsules of regular Heliocare OR take 1 capsule of Heliocare Ultra. For prolonged exposure (such as a full day in the sun), you can repeat your dose of the supplement 4 hours after your first dose.  Heliocare can be purchased at [facility] or at [email or web address].   

## 2019-11-23 NOTE — Progress Notes (Signed)
Follow-Up Visit   Subjective  Linda Henson is a 64 y.o. female who presents for the following: Annual Exam (no hx of skin CA but has a fhx of skin CA in mother on the face).   Here for full body skin exam and skin cancer screening  Previously treated wart on the L forehead has resolved but she isn't sure if the one on the L heel is still present. Patient also requests refills of Clobetasol ointment for her LS et A (vaginal and perianal) which has recently started to flare.   The following portions of the chart were reviewed this encounter and updated as appropriate:  Tobacco  Allergies  Meds  Problems  Med Hx  Surg Hx  Fam Hx     Review of Systems:  No other skin or systemic complaints except as noted in HPI or Assessment and Plan.  Objective  Well appearing patient in no apparent distress; mood and affect are within normal limits.  A full examination was performed including scalp, head, eyes, ears, nose, lips, neck, chest, axillae, abdomen, back, buttocks, bilateral upper extremities, bilateral lower extremities, hands, feet, fingers, toes, fingernails, and toenails. All findings within normal limits unless otherwise noted below.  Objective  L med heel (residual), L 1st-4th finger around the distal DIP, R fingers scattered between DIP jointns: Numerous verrucous papules  Objective  R upper arm, L med calf: Firm pink/brown papulenodule with dimple sign.   Objective  vaginal and buttocks areas: Erythematous hypopigmented patches of the labia majora and perianal areas.  Assessment & Plan  Viral warts, unspecified type L med heel (residual), L 1st-4th finger around the distal DIP, R fingers scattered between DIP jointns  Flat warts Discussed viral etiology and risk of spread.  Discussed multiple treatments may be required to clear warts.  Discussed possible post-treatment dyspigmentation and risk of recurrence.  Start Imiquimod 5% cream QHS if too much irritation  occurs decrease use to every other night or three days per week.   Reviewed expected reaction including irritation and mild inflammation and risk of erosions or more severe inflammation. Reviewed not to apply this in an area larger than 4 x 4 inches to avoid flu-like symptoms. Only a thin layer is required. Reviewed if too much irritation occurs, to ensure application of only a thin layer and decrease frequency to achieve a tolerable level of inflammation.    imiquimod (ALDARA) 5 % cream - L med heel (residual), L 1st-4th finger around the distal DIP, R fingers scattered between DIP jointns  Dermatofibroma R upper arm, L med calf  Benign-appearing.  Observation.  Call clinic for new or changing lesions.  Recommend daily use of broad spectrum spf 30+ sunscreen to sun-exposed areas.  Lichen sclerosus et atrophicus vaginal and buttocks areas  Chronic, improved but not at goal.  Lichen sclerosus is a chronic inflammatory condition that is NOT sexually transmitted. It requires regular monitoring and treatment to minimize inflammation to reduce risk of scarring. There is also a risk of cancer in the area which is very low if inflammation is adequately controlled. Regular checks of the area are recommended. Please call if you notice any new or changing spots within this area.    Continue Clobetasol 0.05% ointment to aa's BID PRN flares. Patient to observe for pain during urination, bowel movements, and sexual interactions and RTC should any of those symptoms occur.   Topical steroids (such as triamcinolone, fluocinolone, fluocinonide, mometasone, clobetasol, halobetasol, betamethasone, hydrocortisone) can cause thinning  and lightening of the skin if they are used for too long in the same area. Use only as directed.   clobetasol ointment (TEMOVATE) 0.05 % - vaginal and buttocks areas  Other Related Medications clobetasol ointment (TEMOVATE) 0.05 %  Lentigines - Scattered tan macules -  Discussed due to sun exposure - Benign, observe - Call for any changes  Seborrheic Keratoses - Stuck-on, waxy, tan-brown papules and plaques  - Discussed benign etiology and prognosis. - Observe - Call for any changes  Melanocytic Nevi - Tan-brown and/or pink-flesh-colored symmetric macules and papules - Benign appearing on exam today - Observation - Call clinic for new or changing moles - Recommend daily use of broad spectrum spf 30+ sunscreen to sun-exposed areas.   Hemangiomas - Red papules - Discussed benign nature - Observe - Call for any changes  Actinic Damage - diffuse scaly erythematous macules with underlying dyspigmentation - Recommend daily broad spectrum sunscreen SPF 30+ to sun-exposed areas, reapply every 2 hours as needed.  - Call for new or changing lesions. - Recommend taking Heliocare sun protection supplement daily in sunny weather for additional sun protection. For maximum protection on the sunniest days, you can take up to 2 capsules of regular Heliocare OR take 1 capsule of Heliocare Ultra. For prolonged exposure (such as a full day in the sun), you can repeat your dose of the supplement 4 hours after your first dose. Heliocare can be purchased at [facility] or at [email or web address].   Skin cancer screening performed today.  Return in about 3 months (around 02/22/2020) for LS et A and wart follow up.  Luther Redo, CMA, am acting as scribe for Forest Gleason, MD .  Documentation: I have reviewed the above documentation for accuracy and completeness, and I agree with the above.  Forest Gleason, MD

## 2019-12-12 ENCOUNTER — Encounter: Payer: Self-pay | Admitting: Dermatology

## 2019-12-23 DIAGNOSIS — G4733 Obstructive sleep apnea (adult) (pediatric): Secondary | ICD-10-CM | POA: Diagnosis not present

## 2019-12-26 ENCOUNTER — Encounter: Payer: Self-pay | Admitting: Family Medicine

## 2020-01-10 DIAGNOSIS — W1781XA Fall down embankment (hill), initial encounter: Secondary | ICD-10-CM | POA: Diagnosis not present

## 2020-01-10 DIAGNOSIS — S4991XA Unspecified injury of right shoulder and upper arm, initial encounter: Secondary | ICD-10-CM | POA: Diagnosis not present

## 2020-01-10 DIAGNOSIS — S40921A Unspecified superficial injury of right upper arm, initial encounter: Secondary | ICD-10-CM | POA: Diagnosis not present

## 2020-01-10 DIAGNOSIS — M25511 Pain in right shoulder: Secondary | ICD-10-CM | POA: Diagnosis not present

## 2020-01-13 ENCOUNTER — Ambulatory Visit: Payer: BC Managed Care – PPO | Admitting: Urology

## 2020-01-16 DIAGNOSIS — S40011A Contusion of right shoulder, initial encounter: Secondary | ICD-10-CM | POA: Diagnosis not present

## 2020-01-16 DIAGNOSIS — M7541 Impingement syndrome of right shoulder: Secondary | ICD-10-CM | POA: Diagnosis not present

## 2020-01-16 DIAGNOSIS — M7551 Bursitis of right shoulder: Secondary | ICD-10-CM | POA: Diagnosis not present

## 2020-02-17 DIAGNOSIS — M7551 Bursitis of right shoulder: Secondary | ICD-10-CM | POA: Diagnosis not present

## 2020-02-17 DIAGNOSIS — S40011D Contusion of right shoulder, subsequent encounter: Secondary | ICD-10-CM | POA: Diagnosis not present

## 2020-02-17 DIAGNOSIS — M7541 Impingement syndrome of right shoulder: Secondary | ICD-10-CM | POA: Diagnosis not present

## 2020-02-23 ENCOUNTER — Ambulatory Visit: Payer: BC Managed Care – PPO | Admitting: Dermatology

## 2020-02-28 ENCOUNTER — Telehealth (INDEPENDENT_AMBULATORY_CARE_PROVIDER_SITE_OTHER): Payer: BC Managed Care – PPO | Admitting: Family Medicine

## 2020-02-28 ENCOUNTER — Other Ambulatory Visit: Payer: Self-pay

## 2020-02-28 ENCOUNTER — Encounter: Payer: Self-pay | Admitting: Family Medicine

## 2020-02-28 VITALS — Ht 65.0 in | Wt 182.0 lb

## 2020-02-28 DIAGNOSIS — R030 Elevated blood-pressure reading, without diagnosis of hypertension: Secondary | ICD-10-CM | POA: Diagnosis not present

## 2020-02-28 DIAGNOSIS — E559 Vitamin D deficiency, unspecified: Secondary | ICD-10-CM

## 2020-02-28 DIAGNOSIS — Z0001 Encounter for general adult medical examination with abnormal findings: Secondary | ICD-10-CM | POA: Diagnosis not present

## 2020-02-28 DIAGNOSIS — Z1231 Encounter for screening mammogram for malignant neoplasm of breast: Secondary | ICD-10-CM

## 2020-02-28 DIAGNOSIS — E785 Hyperlipidemia, unspecified: Secondary | ICD-10-CM

## 2020-02-28 NOTE — Assessment & Plan Note (Signed)
BP of 144/83. Suspect naprosyn is playing a role. We will have her come for a nurse BP check.

## 2020-02-28 NOTE — Progress Notes (Signed)
Virtual Visit via video Note  This visit type was conducted due to national recommendations for restrictions regarding the COVID-19 pandemic (e.g. social distancing).  This format is felt to be most appropriate for this patient at this time.  All issues noted in this document were discussed and addressed.  No physical exam was performed (except for noted visual exam findings with Video Visits).   I connected with Linda Henson today at  8:30 AM EST by a video enabled telemedicine application and verified that I am speaking with the correct person using two identifiers. Location patient: home Location provider: home office Persons participating in the virtual visit: patient, provider  I discussed the limitations, risks, security and privacy concerns of performing an evaluation and management service by telephone and the availability of in person appointments. I also discussed with the patient that there may be a patient responsible charge related to this service. The patient expressed understanding and agreed to proceed.  Reason for visit: CPE  HPI: Diet: has gotten off the healthy diet recently as she suffered an injury with a fall Exercise: decreased with her injury Patient has been following with sports medicine for the shoulder injury. No fracture per her report. Has been on naprosyn 500 mg once daily.  Pap smear: not indicated, s/p hysterectomy Colonoscopy: reports she saw GI recently and they advised 7 year followup - not due for 2 more years Mammogram: due Family history-  Colon cancer: no  Breast cancer: no  Ovarian cancer: no Menses: hysterectomy Vaccines-   Flu: UTD  Tetanus: UTD  Shingles: UTD  COVID19: UTD HIV screening: previously declined Hep C Screening: UTD Tobacco use: no Alcohol use: 2/day Illicit Drug use: no Dentist: yes Ophthalmology: yes    ROS: See pertinent positives and negatives per HPI.  Past Medical History:  Diagnosis Date  . Arthritis     spine - mild  . Colon polyp    Repeat colonoscopy 2018  . COPD (chronic obstructive pulmonary disease) ([place])   . Diverticulitis   . GERD (gastroesophageal reflux disease)    Resolved with wt loss  . Hepatitis C 2018   treated with Harvoni  . Hyperlipidemia    Borderline   . Iron deficiency anemia 2014  . Sleep apnea    CPAP    Past Surgical History:  Procedure Laterality Date  . ABDOMINAL HYSTERECTOMY  1994  . CATARACT EXTRACTION W/PHACO Right 08/30/2019   Procedure: CATARACT EXTRACTION PHACO AND INTRAOCULAR LENS PLACEMENT ([place]) RIGHT;  Surgeon: Birder Robson, MD;  Location: [place];  Service: Ophthalmology;  Laterality: Right;  2.16 0:20.2  . COLONOSCOPY WITH PROPOFOL N/A 02/05/2015   Procedure: COLONOSCOPY WITH PROPOFOL;  Surgeon: Josefine Class, MD;  Location: [facility] ENDOSCOPY;  Service: Endoscopy;  Laterality: N/A;  . EYE SURGERY    . SALPINGOOPHORECTOMY Right 1994    Family History  Problem Relation Age of Onset  . Heart disease Father 28       CAD - died of MI  . Hyperlipidemia Father   . Cancer Brother 41       stomach and esophageal cancer  . Hypothyroidism Sister   . Diabetes Paternal Grandmother   . Breast cancer Neg Hx     SOCIAL HX: nonsmoker   Current Outpatient Medications:  .  clobetasol ointment (TEMOVATE) 0.05 %, Apply to affected area twice daily x 1 week, at night x 1 week, Monday-Wednesday-Friday HS x 1 week, then twice weekly HS, Disp: 60 g, Rfl: 0 .  clobetasol ointment (TEMOVATE) 2.84 %, Apply 1 application topically 2 (two) times daily. Apply to aa's BID PRN flares., Disp: 30 g, Rfl: 1 .  conjugated estrogens (PREMARIN) vaginal cream, Place 1 Applicatorful vaginally daily. Use pea sized amount M-W-Fr before bedtime, Disp: 42.5 g, Rfl: 12 .  imiquimod (ALDARA) 5 % cream, Apply a thin coat to aa's warts QHS. If too irritating decrease use to every other night as tolerated., Disp: 24 each, Rfl: 0 .  naproxen (NAPROSYN) 500 MG  tablet, Take 500 mg by mouth 2 (two) times daily., Disp: , Rfl:  .  pantoprazole (PROTONIX) 40 MG tablet, TAKE 1 TABLET BY MOUTH DAILY, Disp: 90 tablet, Rfl: 1  EXAM:  VITALS per patient if applicable:  GENERAL: alert, oriented, appears well and in no acute distress  HEENT: atraumatic, conjunttiva clear, no obvious abnormalities on inspection of external nose and ears  NECK: normal movements of the head and neck  LUNGS: on inspection no signs of respiratory distress, breathing rate appears normal, no obvious gross SOB, gasping or wheezing  CV: no obvious cyanosis  MS: moves all visible extremities without noticeable abnormality  PSYCH/NEURO: pleasant and cooperative, no obvious depression or anxiety, speech and thought processing grossly intact  ASSESSMENT AND PLAN:  Discussed the following assessment and plan:  Problem List Items Addressed This Visit    Elevated BP without diagnosis of hypertension    BP of 144/83. Suspect naprosyn is playing a role. We will have her come for a nurse BP check.       Relevant Orders   HgB A1c   Encounter for general adult medical examination with abnormal findings - Primary    CPE completed. Encouraged getting back to diet and exercise once she has recovered from her injury. She will call to schedule her mammogram. Vaccines up to date. Encouraged to decrease alcohol intake to no more than one drink per day. Lab work as outlined.       Hyperlipidemia   Relevant Orders   Comp Met (CMET)   Lipid panel    Other Visit Diagnoses    Vitamin D deficiency       Relevant Orders   Vitamin D (25 hydroxy)   Encounter for screening mammogram for malignant neoplasm of breast       Relevant Orders   MM 3D SCREEN BREAST BILATERAL       I discussed the assessment and treatment plan with the patient. The patient was provided an opportunity to ask questions and all were answered. The patient agreed with the plan and demonstrated an understanding of the  instructions.   The patient was advised to call back or seek an in-person evaluation if the symptoms worsen or if the condition fails to improve as anticipated.   Tommi Rumps, MD

## 2020-02-28 NOTE — Assessment & Plan Note (Signed)
CPE completed. Encouraged getting back to diet and exercise once she has recovered from her injury. She will call to schedule her mammogram. Vaccines up to date. Encouraged to decrease alcohol intake to no more than one drink per day. Lab work as outlined.

## 2020-03-14 ENCOUNTER — Other Ambulatory Visit: Payer: Self-pay

## 2020-03-14 ENCOUNTER — Ambulatory Visit (INDEPENDENT_AMBULATORY_CARE_PROVIDER_SITE_OTHER): Payer: BC Managed Care – PPO

## 2020-03-14 VITALS — BP 140/90 | HR 57 | Temp 98.1°F

## 2020-03-14 DIAGNOSIS — E785 Hyperlipidemia, unspecified: Secondary | ICD-10-CM

## 2020-03-14 DIAGNOSIS — R030 Elevated blood-pressure reading, without diagnosis of hypertension: Secondary | ICD-10-CM | POA: Diagnosis not present

## 2020-03-14 DIAGNOSIS — E559 Vitamin D deficiency, unspecified: Secondary | ICD-10-CM

## 2020-03-14 LAB — LIPID PANEL
Cholesterol: 235 mg/dL — ABNORMAL HIGH (ref 0–200)
HDL: 57.7 mg/dL (ref >39.00)
NonHDL: 177.66
Total CHOL/HDL Ratio: 4
Triglycerides: 267 mg/dL — ABNORMAL HIGH (ref 0.0–149.0)
VLDL: 53.4 mg/dL — ABNORMAL HIGH (ref 0.0–40.0)

## 2020-03-14 LAB — COMPREHENSIVE METABOLIC PANEL
ALT: 16 U/L (ref 0–35)
AST: 14 U/L (ref 0–37)
Albumin: 4.3 g/dL (ref 3.5–5.2)
Alkaline Phosphatase: 48 U/L (ref 39–117)
BUN: 15 mg/dL (ref 6–23)
CO2: 26 mEq/L (ref 19–32)
Calcium: 9.3 mg/dL (ref 8.4–10.5)
Chloride: 103 mEq/L (ref 96–112)
Creatinine, Ser: 0.75 mg/dL (ref 0.40–1.20)
GFR: 83.83 mL/min (ref >60.00)
Glucose, Bld: 109 mg/dL — ABNORMAL HIGH (ref 70–99)
Potassium: 4 mEq/L (ref 3.5–5.1)
Sodium: 138 mEq/L (ref 135–145)
Total Bilirubin: 0.6 mg/dL (ref 0.2–1.2)
Total Protein: 6.8 g/dL (ref 6.0–8.3)

## 2020-03-14 LAB — HEMOGLOBIN A1C: Hgb A1c MFr Bld: 5.3 % (ref 4.6–6.5)

## 2020-03-14 LAB — VITAMIN D 25 HYDROXY (VIT D DEFICIENCY, FRACTURES): VITD: 24.38 ng/mL — ABNORMAL LOW (ref 30.00–100.00)

## 2020-03-14 LAB — LDL CHOLESTEROL, DIRECT: Direct LDL: 131 mg/dL

## 2020-03-14 NOTE — Progress Notes (Cosign Needed Addendum)
Patient is here for a BP check due to bp being high at last visit, as per patient.  Currently patients BP is 148/100 and BPM is 60. Patient has no complaints of headaches, blurry vision, chest pain, arm pain, light headedness, dizziness, and nor jaw pain. Patient waited 5 minutes before retaking BP and was 140/90 and BPM was 57. Please see previous note for order.   Reviewed BP and defer medication changes to PCP whom has sent in amlodipine. Rennie Plowman, NP

## 2020-03-14 NOTE — Addendum Note (Signed)
Addended by: Bonnell Public I on: 03/14/2020 08:39 AM   Modules accepted: Orders

## 2020-03-15 ENCOUNTER — Ambulatory Visit
Admission: RE | Admit: 2020-03-15 | Discharge: 2020-03-15 | Disposition: A | Discharge status: Home / Self Care | Payer: BC Managed Care – PPO | Source: Ambulatory Visit | Attending: Family Medicine | Admitting: Family Medicine

## 2020-03-15 DIAGNOSIS — Z1231 Encounter for screening mammogram for malignant neoplasm of breast: Secondary | ICD-10-CM | POA: Diagnosis not present

## 2020-03-19 DIAGNOSIS — M85852 Other specified disorders of bone density and structure, left thigh: Secondary | ICD-10-CM | POA: Diagnosis not present

## 2020-03-19 DIAGNOSIS — E559 Vitamin D deficiency, unspecified: Secondary | ICD-10-CM | POA: Diagnosis not present

## 2020-03-19 DIAGNOSIS — M8949 Other hypertrophic osteoarthropathy, multiple sites: Secondary | ICD-10-CM | POA: Diagnosis not present

## 2020-03-21 NOTE — Progress Notes (Signed)
BP is uncontrolled. I would recommend adding amlodipine 5 mg daily to help lower her BP. Her BP goal is <130/80.

## 2020-03-22 NOTE — Progress Notes (Signed)
Patient is okay with adding amlodipine

## 2020-03-23 MED ORDER — AMLODIPINE BESYLATE 5 MG PO TABS
5.0000 mg | ORAL_TABLET | Freq: Every day | ORAL | 3 refills | Status: DC
Start: 2020-03-23 — End: 2021-03-27

## 2020-03-23 NOTE — Addendum Note (Signed)
Addended by: Caryl Bis, Kassadee Carawan G on: 03/23/2020 12:05 PM   Modules accepted: Orders

## 2020-03-23 NOTE — Progress Notes (Signed)
Amlodipine sent to pharmacy.  Patient needs BP check with nursing in 1 month.

## 2020-03-23 NOTE — Progress Notes (Signed)
Patient is scheduled for BP check on 04/23/20 at 4:00 pm

## 2020-03-25 ENCOUNTER — Other Ambulatory Visit: Payer: Self-pay | Admitting: Family Medicine

## 2020-03-25 DIAGNOSIS — E559 Vitamin D deficiency, unspecified: Secondary | ICD-10-CM

## 2020-03-25 DIAGNOSIS — E785 Hyperlipidemia, unspecified: Secondary | ICD-10-CM

## 2020-03-25 MED ORDER — ROSUVASTATIN CALCIUM 20 MG PO TABS: 20.0000 mg | ORAL_TABLET | Freq: Every day | ORAL | 3 refills | Status: DC

## 2020-03-25 MED ORDER — VITAMIN D (ERGOCALCIFEROL) 1.25 MG (50000 UNIT) PO CAPS: 50000.0000 [IU] | ORAL_CAPSULE | ORAL | 0 refills | Status: DC

## 2020-03-26 DIAGNOSIS — G4733 Obstructive sleep apnea (adult) (pediatric): Secondary | ICD-10-CM | POA: Diagnosis not present

## 2020-03-30 DIAGNOSIS — S40011D Contusion of right shoulder, subsequent encounter: Secondary | ICD-10-CM | POA: Diagnosis not present

## 2020-03-30 DIAGNOSIS — M7551 Bursitis of right shoulder: Secondary | ICD-10-CM | POA: Diagnosis not present

## 2020-03-30 DIAGNOSIS — M7541 Impingement syndrome of right shoulder: Secondary | ICD-10-CM | POA: Diagnosis not present

## 2020-04-12 DIAGNOSIS — H2513 Age-related nuclear cataract, bilateral: Secondary | ICD-10-CM | POA: Diagnosis not present

## 2020-04-24 ENCOUNTER — Ambulatory Visit (INDEPENDENT_AMBULATORY_CARE_PROVIDER_SITE_OTHER): Payer: BC Managed Care – PPO

## 2020-04-24 ENCOUNTER — Other Ambulatory Visit: Payer: Self-pay

## 2020-04-24 DIAGNOSIS — R030 Elevated blood-pressure reading, without diagnosis of hypertension: Secondary | ICD-10-CM

## 2020-04-24 NOTE — Progress Notes (Signed)
Patient is here for a BP check due to bp being high at last visit, as per patient.  Currently patients BP is 126/78 and BPM is 62. Ten minutes earlier BP was 144/77 and BPM was 64.  Patient has no complaints of headaches, blurry vision, chest pain, arm pain, light headedness, dizziness, and nor jaw pain. Please see previous note for order.

## 2020-05-28 ENCOUNTER — Other Ambulatory Visit: Payer: Self-pay

## 2020-05-30 ENCOUNTER — Telehealth (INDEPENDENT_AMBULATORY_CARE_PROVIDER_SITE_OTHER): Payer: BC Managed Care – PPO | Admitting: Family Medicine

## 2020-05-30 ENCOUNTER — Other Ambulatory Visit: Payer: Self-pay

## 2020-05-30 ENCOUNTER — Ambulatory Visit
Admission: RE | Admit: 2020-05-30 | Discharge: 2020-05-30 | Disposition: A | Discharge status: Home / Self Care | Payer: BC Managed Care – PPO | Source: Ambulatory Visit | Attending: Family Medicine | Admitting: Family Medicine

## 2020-05-30 DIAGNOSIS — E785 Hyperlipidemia, unspecified: Secondary | ICD-10-CM

## 2020-05-30 DIAGNOSIS — I10 Essential (primary) hypertension: Secondary | ICD-10-CM | POA: Insufficient documentation

## 2020-05-30 DIAGNOSIS — M79662 Pain in left lower leg: Secondary | ICD-10-CM

## 2020-05-30 DIAGNOSIS — M7989 Other specified soft tissue disorders: Secondary | ICD-10-CM | POA: Diagnosis not present

## 2020-05-30 DIAGNOSIS — Z6834 Body mass index (BMI) 34.0-34.9, adult: Secondary | ICD-10-CM

## 2020-05-30 DIAGNOSIS — M79605 Pain in left leg: Secondary | ICD-10-CM | POA: Diagnosis not present

## 2020-05-30 DIAGNOSIS — E6609 Other obesity due to excess calories: Secondary | ICD-10-CM

## 2020-05-30 DIAGNOSIS — L900 Lichen sclerosus et atrophicus: Secondary | ICD-10-CM

## 2020-05-30 HISTORY — DX: Other specified soft tissue disorders: M79.662

## 2020-05-30 HISTORY — DX: Other specified soft tissue disorders: M79.89

## 2020-05-30 NOTE — Assessment & Plan Note (Addendum)
Ultrasound ordered to be completed today to evaluate for blood clot.  Advised that the referral coordinator will contact her to get this scheduled.

## 2020-05-30 NOTE — Assessment & Plan Note (Addendum)
I encouraged her to restart her medications as prescribed by dermatology.  She will contact dermatology for follow-up.  Discussed that there is a risk of squamous cell carcinoma with lichen sclerosus particularly if the inflammation from lichen sclerosus is not adequately controlled.

## 2020-05-30 NOTE — Assessment & Plan Note (Signed)
Encourage diet and exercise.  She asked about Plenity.  I advised that have to do some research to see if this would be an okay option for her.

## 2020-05-30 NOTE — Assessment & Plan Note (Signed)
Well-controlled.  She will continue amlodipine 5 mg once daily. 

## 2020-05-30 NOTE — Assessment & Plan Note (Signed)
She will continue Crestor 20 mg once daily.  She does need follow-up lab work.  This will be scheduled.

## 2020-05-30 NOTE — Progress Notes (Signed)
Virtual Visit via telephone Note  This visit type was conducted due to national recommendations for restrictions regarding the COVID-19 pandemic (e.g. social distancing).  This format is felt to be most appropriate for this patient at this time.  All issues noted in this document were discussed and addressed.  No physical exam was performed (except for noted visual exam findings with Video Visits).   I connected with Linda Henson today at  8:30 AM EDT by telephone and verified that I am speaking with the correct person using two identifiers. Location patient: home Location provider: home office Persons participating in the virtual visit: patient, provider  I discussed the limitations, risks, security and privacy concerns of performing an evaluation and management service by telephone and the availability of in person appointments. I also discussed with the patient that there may be a patient responsible charge related to this service. The patient expressed understanding and agreed to proceed.  Interactive audio and video telecommunications were attempted between this provider and patient, however failed, due to patient having technical difficulties OR patient did not have access to video capability.  We continued and completed visit with audio only.   Reason for visit: Follow-up  HPI: Hypertension: Blood pressure is 124/74 this morning.  Taking amlodipine.  No chest pain or shortness of breath.  Does report some left leg edema.  Left leg edema: This started after she went on a trip to the [facility].  She had a bump on her leg that was tender to palpation.  She notes the swelling has improved slightly and the bump has gone down.  She notes no right leg swelling.  Hyperlipidemia: Taking Crestor.  No right upper quadrant pain or myalgias.  Obesity: Patient notes she has not been exercising though notes she will get back to playing golf soon.  She had a shoulder injury last year that limited  her ability to do activities.  She has been eating the wrong foods.  Though she does try to limit carbs.  Lichen sclerosus: Notes she has not had a flare in a while.  She has not been using her medicines as prescribed by dermatology.  She is due for a visit with them.  She is aware of the risk of cancer related to this kind of skin issue.   ROS: See pertinent positives and negatives per HPI.  Past Medical History:  Diagnosis Date  . Arthritis    spine - mild  . Colon polyp    Repeat colonoscopy 2018  . COPD (chronic obstructive pulmonary disease) ([place])   . Diverticulitis   . GERD (gastroesophageal reflux disease)    Resolved with wt loss  . Hepatitis C 2018   treated with Harvoni  . Hyperlipidemia    Borderline   . Iron deficiency anemia 2014  . Sleep apnea    CPAP    Past Surgical History:  Procedure Laterality Date  . ABDOMINAL HYSTERECTOMY  1994  . CATARACT EXTRACTION W/PHACO Right 08/30/2019   Procedure: CATARACT EXTRACTION PHACO AND INTRAOCULAR LENS PLACEMENT ([place]) RIGHT;  Surgeon: Birder Robson, MD;  Location: [place];  Service: Ophthalmology;  Laterality: Right;  2.16 0:20.2  . COLONOSCOPY WITH PROPOFOL N/A 02/05/2015   Procedure: COLONOSCOPY WITH PROPOFOL;  Surgeon: Josefine Class, MD;  Location: [facility] ENDOSCOPY;  Service: Endoscopy;  Laterality: N/A;  . EYE SURGERY    . SALPINGOOPHORECTOMY Right 1994    Family History  Problem Relation Age of Onset  . Heart disease Father  32       CAD - died of MI  . Hyperlipidemia Father   . Cancer Brother 41       stomach and esophageal cancer  . Hypothyroidism Sister   . Diabetes Paternal Grandmother   . Breast cancer Neg Hx     SOCIAL HX: Former smoker   Current Outpatient Medications:  .  amLODipine (NORVASC) 5 MG tablet, Take 1 tablet (5 mg total) by mouth daily., Disp: 90 tablet, Rfl: 3 .  clobetasol ointment (TEMOVATE) 0.05 %, Apply to affected area twice daily x 1 week, at night x 1 week,  Monday-Wednesday-Friday HS x 1 week, then twice weekly HS, Disp: 60 g, Rfl: 0 .  clobetasol ointment (TEMOVATE) 1.61 %, Apply 1 application topically 2 (two) times daily. Apply to aa's BID PRN flares., Disp: 30 g, Rfl: 1 .  conjugated estrogens (PREMARIN) vaginal cream, Place 1 Applicatorful vaginally daily. Use pea sized amount M-W-Fr before bedtime, Disp: 42.5 g, Rfl: 12 .  imiquimod (ALDARA) 5 % cream, Apply a thin coat to aa's warts QHS. If too irritating decrease use to every other night as tolerated., Disp: 24 each, Rfl: 0 .  naproxen (NAPROSYN) 500 MG tablet, Take 500 mg by mouth 2 (two) times daily., Disp: , Rfl:  .  pantoprazole (PROTONIX) 40 MG tablet, TAKE 1 TABLET BY MOUTH DAILY, Disp: 90 tablet, Rfl: 1 .  rosuvastatin (CRESTOR) 20 MG tablet, Take 1 tablet (20 mg total) by mouth daily., Disp: 90 tablet, Rfl: 3 .  Vitamin D, Ergocalciferol, (DRISDOL) 1.25 MG (50000 UNIT) CAPS capsule, Take 1 capsule (50,000 Units total) by mouth every 7 (seven) days., Disp: 6 capsule, Rfl: 0  EXAM: This was a telephone visit and thus no physical exam was completed  ASSESSMENT AND PLAN:  Discussed the following assessment and plan:  Problem List Items Addressed This Visit    Hyperlipidemia    She will continue Crestor 20 mg once daily.  She does need follow-up lab work.  This will be scheduled.      Hypertension    Well-controlled.  She will continue amlodipine 5 mg once daily.      Lichen sclerosus et atrophicus    I encouraged her to restart her medications as prescribed by dermatology.  She will contact dermatology for follow-up.  Discussed that there is a risk of squamous cell carcinoma with lichen sclerosus particularly if the inflammation from lichen sclerosus is not adequately controlled.      Obesity    Encourage diet and exercise.  She asked about Plenity.  I advised that have to do some research to see if this would be an okay option for her.      Pain and swelling of left lower  leg - Primary    Ultrasound ordered to be completed today to evaluate for blood clot.  Advised that the referral coordinator will contact her to get this scheduled.      Relevant Orders   US Venous Img Lower Unilateral Left       I discussed the assessment and treatment plan with the patient. The patient was provided an opportunity to ask questions and all were answered. The patient agreed with the plan and demonstrated an understanding of the instructions.   The patient was advised to call back or seek an in-person evaluation if the symptoms worsen or if the condition fails to improve as anticipated.  I provided 14 minutes of non-face-to-face time during this encounter.   Randall Hiss  Caryl Bis, MD

## 2020-06-05 ENCOUNTER — Encounter: Payer: Self-pay | Admitting: Family Medicine

## 2020-06-26 DIAGNOSIS — G4733 Obstructive sleep apnea (adult) (pediatric): Secondary | ICD-10-CM | POA: Diagnosis not present

## 2020-06-29 ENCOUNTER — Other Ambulatory Visit: Payer: Self-pay | Admitting: Family Medicine

## 2020-08-28 IMAGING — CT CT CHEST LUNG CANCER SCREENING LOW DOSE W/O CM
2 of 5 series · 15 of 40 positions shown, 18 images · non-contrast
Comparison: 09/29/2018 screening chest CT.

CLINICAL DATA: 64-year-old asymptomatic female current smoker with
52 pack-year smoking history.

EXAM:
CT CHEST WITHOUT CONTRAST LOW-DOSE FOR LUNG CANCER SCREENING
TECHNIQUE: Multidetector CT imaging of the chest was performed following the
standard protocol without IV contrast.

[Series 3: lung 1.00 · axial · 0.81mm/px · z∈[-1251,-947]mm · 12 of 338 slices shown, 15 images]
[im 17/338  mediastinal]
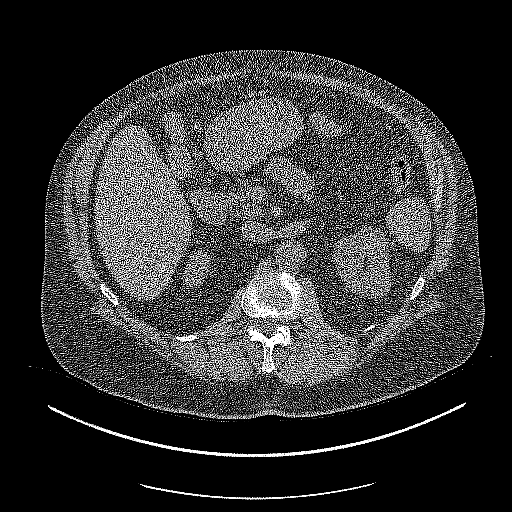
[im 17/338  lung]
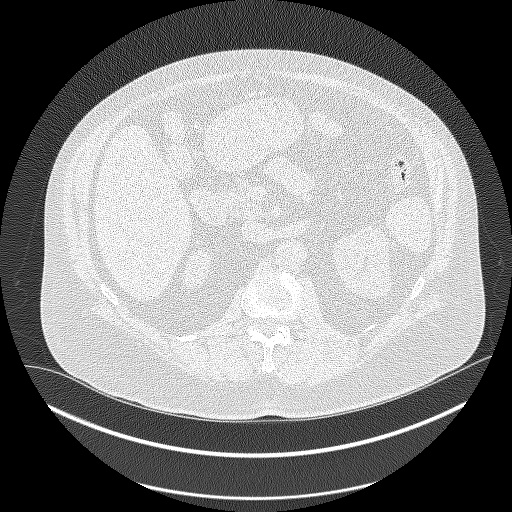
[im 49/338  lung]
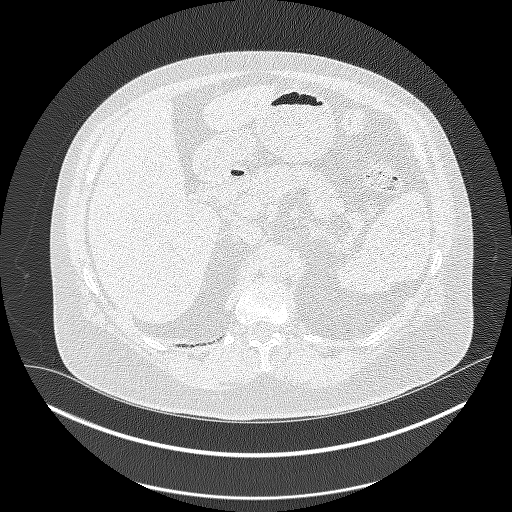
[im 81/338  lung]
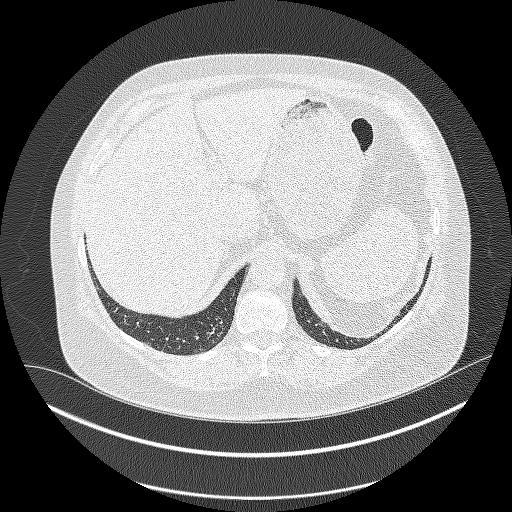
[im 97/338  lung]
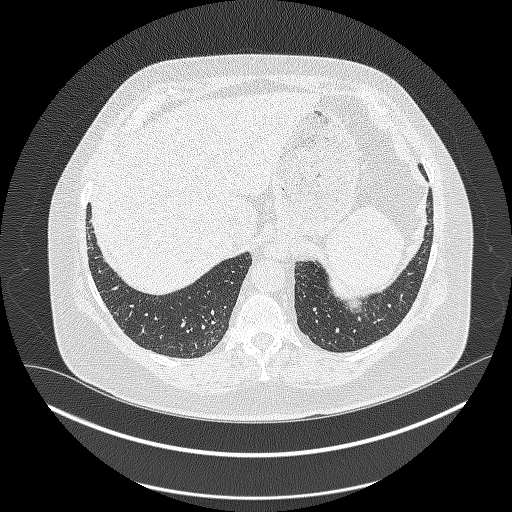
[im 129/338  mediastinal]
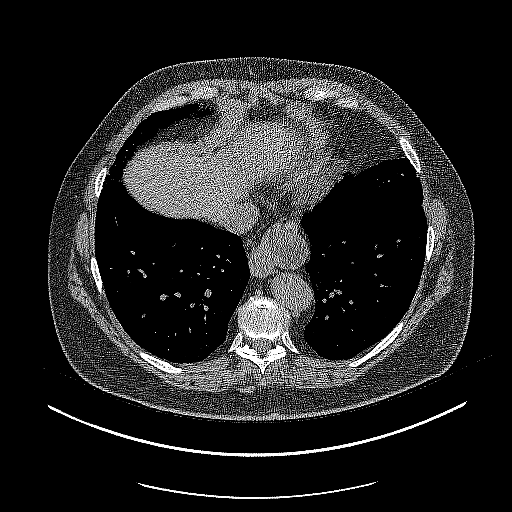
[im 129/338  lung]
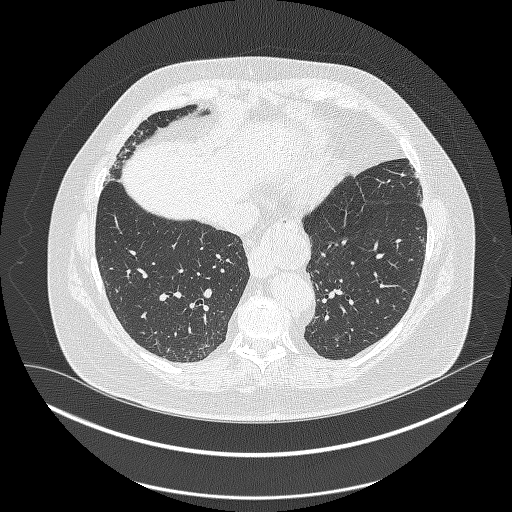
[im 161/338  lung]
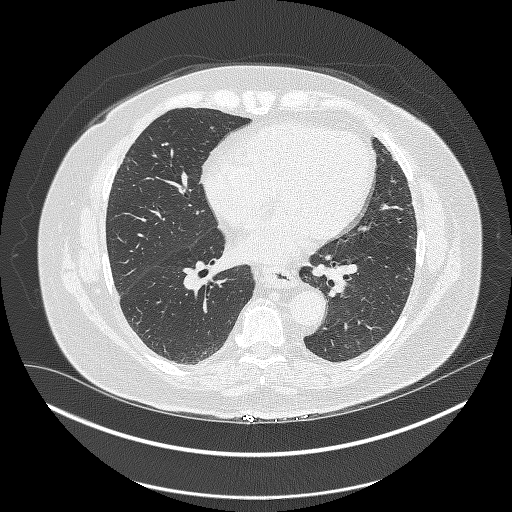
[im 177/338  lung]
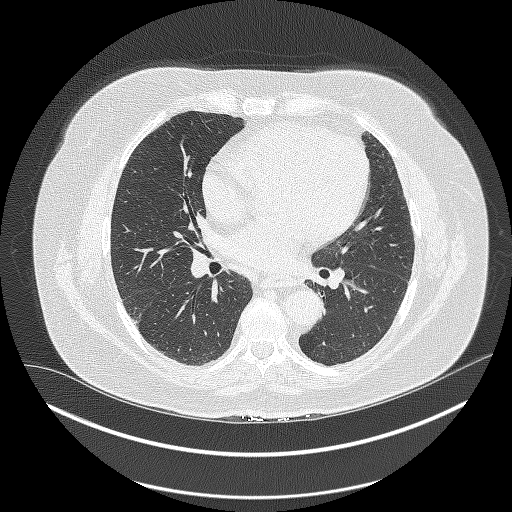
[im 209/338  lung]
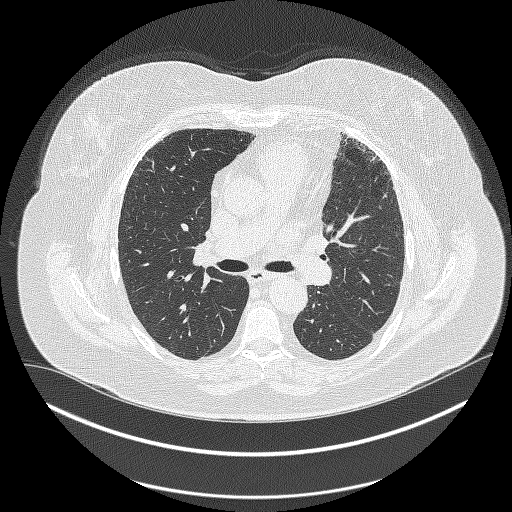
[im 241/338  mediastinal]
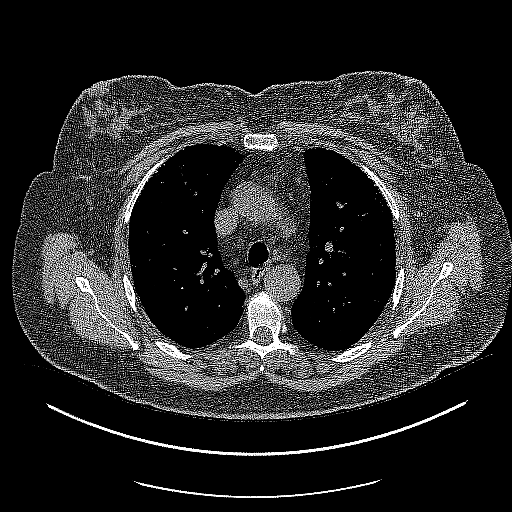
[im 241/338  lung]
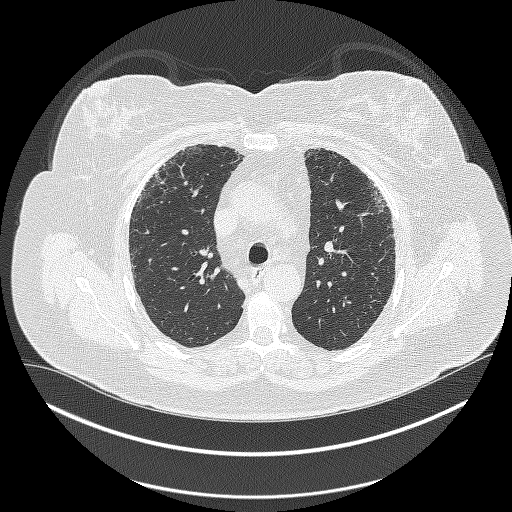
[im 257/338  lung]
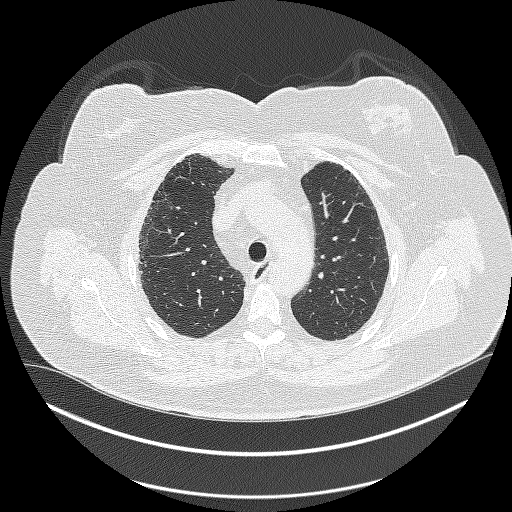
[im 289/338  lung]
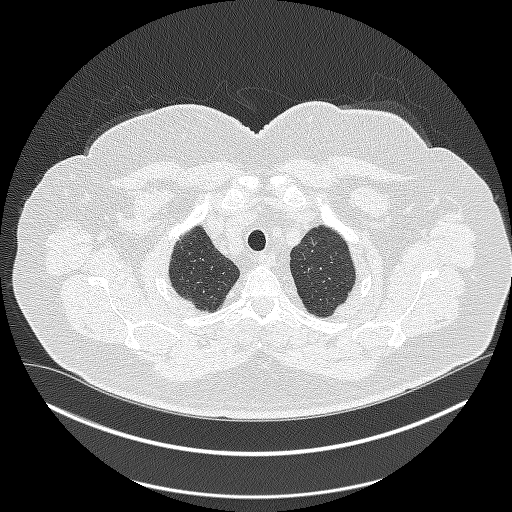
[im 321/338  lung]
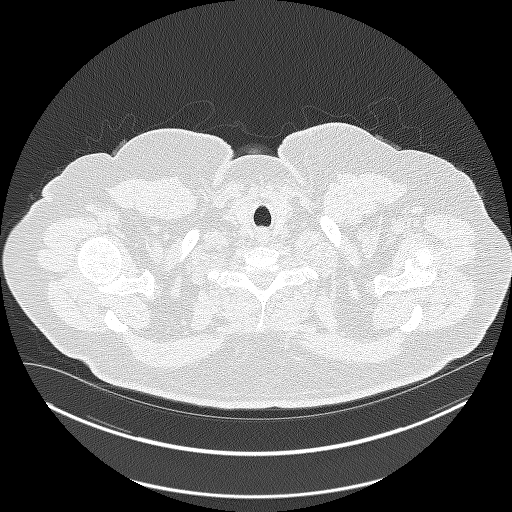

[Series 4: coronals lung 1.00 cor · coronal · 0.66mm/px · 3 of 346 slices shown]
[im 70/346  lung]
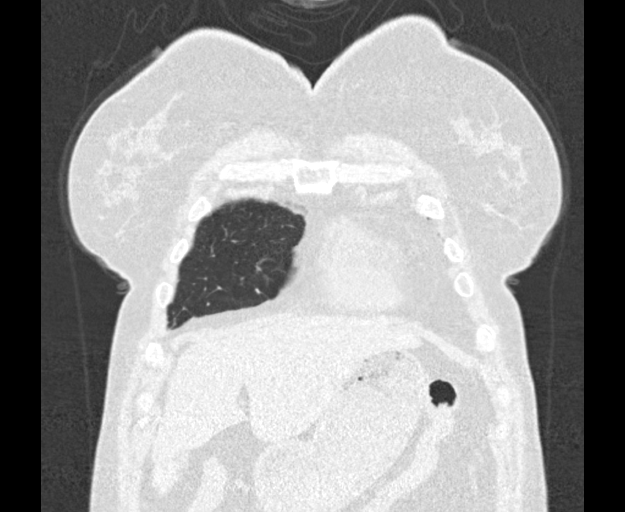
[im 139/346  lung]
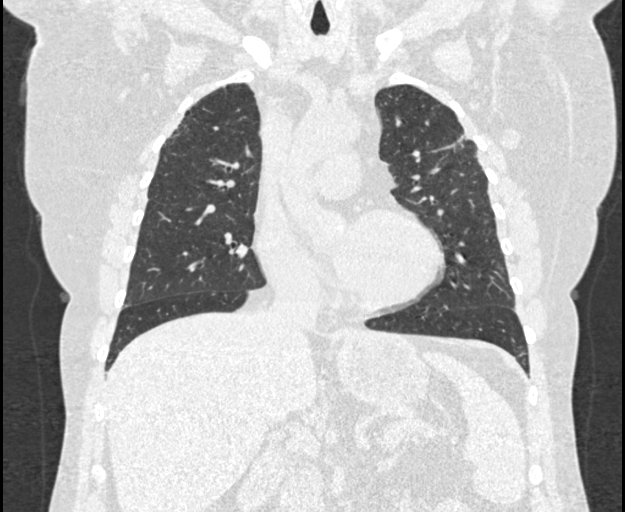
[im 208/346  lung]
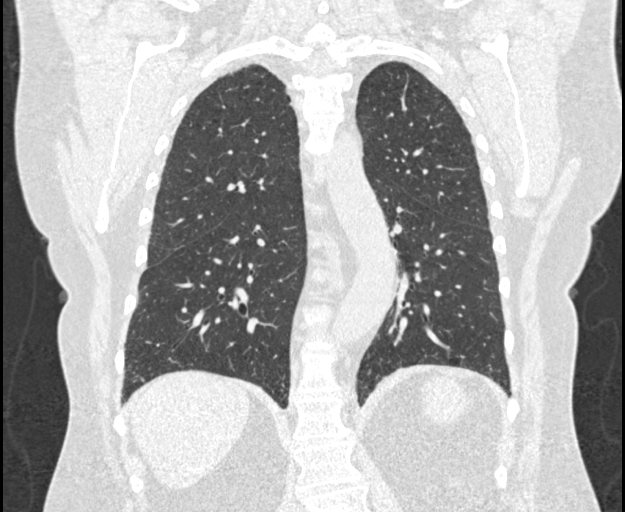

[15 of 40 positions shown; findings below may reference images not displayed]

FINDINGS: Cardiovascular: Normal heart size. No significant pericardial
effusion/thickening. Great vessels are normal in course and caliber.

Mediastinum/Nodes: No discrete thyroid nodules. Unremarkable
esophagus. No pathologically enlarged axillary, mediastinal or hilar
lymph nodes, noting limited sensitivity for the detection of hilar
adenopathy on this noncontrast study.

Lungs/Pleura: No pneumothorax. No pleural effusion. Mild
centrilobular and paraseptal emphysema. No acute consolidative
airspace disease or lung masses. No significant growth of previously
visualized tiny apical left upper lobe pulmonary nodule. Stable tiny
calcified left granuloma. No new significant pulmonary nodules.

Upper abdomen: Small to moderate hiatal hernia.

Musculoskeletal: No aggressive appearing focal osseous lesions. Mild
thoracic spondylosis.
IMPRESSION: 1. Lung-RADS 2, benign appearance or behavior. Continue annual
screening with low-dose chest CT without contrast in 12 months.
2. Small to moderate hiatal hernia.
3. Emphysema ([id number]).

## 2020-09-20 ENCOUNTER — Other Ambulatory Visit: Payer: Self-pay

## 2020-09-20 ENCOUNTER — Ambulatory Visit (INDEPENDENT_AMBULATORY_CARE_PROVIDER_SITE_OTHER): Payer: BC Managed Care – PPO | Admitting: Dermatology

## 2020-09-20 DIAGNOSIS — W57XXXA Bitten or stung by nonvenomous insect and other nonvenomous arthropods, initial encounter: Secondary | ICD-10-CM | POA: Diagnosis not present

## 2020-09-20 DIAGNOSIS — L570 Actinic keratosis: Secondary | ICD-10-CM | POA: Diagnosis not present

## 2020-09-20 DIAGNOSIS — D492 Neoplasm of unspecified behavior of bone, soft tissue, and skin: Secondary | ICD-10-CM

## 2020-09-20 DIAGNOSIS — L820 Inflamed seborrheic keratosis: Secondary | ICD-10-CM | POA: Diagnosis not present

## 2020-09-20 DIAGNOSIS — S70261A Insect bite (nonvenomous), right hip, initial encounter: Secondary | ICD-10-CM | POA: Diagnosis not present

## 2020-09-20 NOTE — Progress Notes (Signed)
   Follow-Up Visit   Subjective  Linda Henson is a 65 y.o. female who presents for the following: Skin Problem (Check a spot on the left lower leg x 3 months, treating with Neosporin with a poor response ). Check spot  on the left neck and right abdomen growing and irritating.    The following portions of the chart were reviewed this encounter and updated as appropriate:   Tobacco  Allergies  Meds  Problems  Med Hx  Surg Hx  Fam Hx       Review of Systems:  No other skin or systemic complaints except as noted in HPI or Assessment and Plan.  Objective  Well appearing patient in no apparent distress; mood and affect are within normal limits.  A focused examination was performed including left leg, left neck, right hip. Relevant physical exam findings are noted in the Assessment and Plan.  left pretibial Erythematous thin papules/macules with gritty scale.   Right hip Edematous erythematous papule  left neck 0.7 cm erythematous tan papule       Assessment & Plan  AK (actinic keratosis) left pretibial  Hypertrophic AK   If not gone at follow up may consider biopsy   Destruction of lesion - left pretibial Complexity: simple   Destruction method: cryotherapy   Informed consent: discussed and consent obtained   Timeout:  patient name, date of birth, surgical site, and procedure verified Lesion destroyed using liquid nitrogen: Yes   Region frozen until ice ball extended beyond lesion: Yes   Outcome: patient tolerated procedure well with no complications   Post-procedure details: wound care instructions given    Bug bite without infection, initial encounter Right hip  Start Clobetasol ointment apply to skin twice a day as needed. Avoid applying to face, groin, and axilla. Use as directed. Risk of skin atrophy with long-term use reviewed.   Topical steroids (such as triamcinolone, fluocinolone, fluocinonide, mometasone, clobetasol, halobetasol, betamethasone,  hydrocortisone) can cause thinning and lightening of the skin if they are used for too long in the same area. Your physician has selected the right strength medicine for your problem and area affected on the body. Please use your medication only as directed by your physician to prevent side effects.    Neoplasm of skin left neck  Epidermal / dermal shaving  Lesion diameter (cm):  0.7 Informed consent: discussed and consent obtained   Timeout: patient name, date of birth, surgical site, and procedure verified   Procedure prep:  Patient was prepped and draped in usual sterile fashion Prep type:  Isopropyl alcohol Anesthesia: the lesion was anesthetized in a standard fashion   Anesthetic:  1% lidocaine w/ epinephrine 1-100,000 buffered w/ 8.4% NaHCO3 Hemostasis achieved with: pressure, aluminum chloride and electrodesiccation   Outcome: patient tolerated procedure well   Post-procedure details: sterile dressing applied and wound care instructions given   Dressing type: bandage and petrolatum    Specimen 1 - Surgical pathology Differential Diagnosis: R/O Irritated tag vs nevus vs other   Check Margins: No  R/O Irritated skin tag vs nevus vs other   Return in about 6 weeks (around 11/01/2020) for 4-8 weeks Ak, Lichen sclerosus .  I, Marye Round, CMA, am acting as scribe for Forest Gleason, MD .   Documentation: I have reviewed the above documentation for accuracy and completeness, and I agree with the above.  Forest Gleason, MD

## 2020-09-20 NOTE — Patient Instructions (Addendum)
Wound Care Instructions  Cleanse wound gently with soap and water once a day then pat dry with clean gauze. Apply a thing coat of Petrolatum (petroleum jelly, "Vaseline") over the wound (unless you have an allergy to this). We recommend that you use a new, sterile tube of Vaseline. Do not pick or remove scabs. Do not remove the yellow or white "healing tissue" from the base of the wound.  Cover the wound with fresh, clean, nonstick gauze and secure with paper tape. You may use Band-Aids in place of gauze and tape if the would is small enough, but would recommend trimming much of the tape off as there is often too much. Sometimes Band-Aids can irritate the skin.  You should call the office for your biopsy report after 1 week if you have not already been contacted.  If you experience any problems, such as abnormal amounts of bleeding, swelling, significant bruising, significant pain, or evidence of infection, please call the office immediately.  FOR ADULT SURGERY PATIENTS: If you need something for pain relief you may take 1 extra strength Tylenol (acetaminophen) AND 2 Ibuprofen (200mg each) together every 4 hours as needed for pain. (do not take these if you are allergic to them or if you have a reason you should not take them.) Typically, you may only need pain medication for 1 to 3 days.    Cryotherapy Aftercare  Wash gently with soap and water everyday.   Apply Vaseline and Band-Aid daily until healed.    If you have any questions or concerns for your doctor, please call our main line at [phone] and press option 4 to reach your doctor's medical assistant. If no one answers, please leave a voicemail as directed and we will return your call as soon as possible. Messages left after 4 pm will be answered the following business day.   You may also send us a message via MyChart. We typically respond to MyChart messages within 1-2 business days.  For prescription refills, please ask your  pharmacy to contact our office. Our fax number is [phone].  If you have an urgent issue when the clinic is closed that cannot wait until the next business day, you can page your doctor at the number below.    Please note that while we do our best to be available for urgent issues outside of office hours, we are not available 24/7.   If you have an urgent issue and are unable to reach us, you may choose to seek medical care at your doctor's office, retail clinic, urgent care center, or emergency room.  If you have a medical emergency, please immediately call 911 or go to the emergency department.  Pager Numbers  - Dr. Kowalski: [phone]  - Dr. Moye: [phone]  - Dr. Stewart: [phone]  In the event of inclement weather, please call our main line at [phone] for an update on the status of any delays or closures.  Dermatology Medication Tips: Please keep the boxes that topical medications come in in order to help keep track of the instructions about where and how to use these. Pharmacies typically print the medication instructions only on the boxes and not directly on the medication tubes.   If your medication is too expensive, please contact our office at [phone] option 4 or send us a message through MyChart.   We are unable to tell what your co-pay for medications will be in advance as this is different depending on your insurance coverage. However,   we may be able to find a substitute medication at lower cost or fill out paperwork to get insurance to cover a needed medication.   If a prior authorization is required to get your medication covered by your insurance company, please allow us 1-2 business days to complete this process.  Drug prices often vary depending on where the prescription is filled and some pharmacies may offer cheaper prices.  The website www.goodrx.com contains coupons for medications through different pharmacies. The prices here do not  account for what the cost may be with help from insurance (it may be cheaper with your insurance), but the website can give you the price if you did not use any insurance.  - You can print the associated coupon and take it with your prescription to the pharmacy.  - You may also stop by our office during regular business hours and pick up a GoodRx coupon card.  - If you need your prescription sent electronically to a different pharmacy, notify our office through  MyChart or by phone at [phone] option 4.  

## 2020-09-25 DIAGNOSIS — G4733 Obstructive sleep apnea (adult) (pediatric): Secondary | ICD-10-CM | POA: Diagnosis not present

## 2020-09-26 ENCOUNTER — Encounter: Payer: Self-pay | Admitting: Dermatology

## 2020-10-05 ENCOUNTER — Other Ambulatory Visit: Payer: Self-pay | Admitting: *Deleted

## 2020-10-05 DIAGNOSIS — F17200 Nicotine dependence, unspecified, uncomplicated: Secondary | ICD-10-CM

## 2020-10-05 DIAGNOSIS — Z87891 Personal history of nicotine dependence: Secondary | ICD-10-CM

## 2020-10-05 NOTE — Progress Notes (Signed)
error 

## 2020-10-12 ENCOUNTER — Other Ambulatory Visit: Payer: Self-pay

## 2020-10-12 ENCOUNTER — Ambulatory Visit
Admission: RE | Admit: 2020-10-12 | Discharge: 2020-10-12 | Disposition: A | Discharge status: Home / Self Care | Payer: BC Managed Care – PPO | Source: Ambulatory Visit | Attending: Acute Care | Admitting: Acute Care

## 2020-10-12 DIAGNOSIS — Z87891 Personal history of nicotine dependence: Secondary | ICD-10-CM | POA: Diagnosis not present

## 2020-10-12 DIAGNOSIS — F17210 Nicotine dependence, cigarettes, uncomplicated: Secondary | ICD-10-CM | POA: Diagnosis not present

## 2020-10-12 DIAGNOSIS — F17200 Nicotine dependence, unspecified, uncomplicated: Secondary | ICD-10-CM | POA: Insufficient documentation

## 2020-10-25 NOTE — Progress Notes (Signed)
Please call patient and let them  know their  low dose Ct was read as a Lung RADS 2: nodules that are benign in appearance and behavior with a very low likelihood of becoming a clinically active cancer due to size or lack of growth. Recommendation per radiology is for a repeat LDCT in 12 months. .Please let them  know we will order and schedule their  annual screening scan for  09/2021. Please let them  know there was notation of CAD on their  scan.  Please remind the patient  that this is a non-gated exam therefore degree or severity of disease  cannot be determined. Please have them  follow up with their PCP regarding potential risk factor modification, dietary therapy or pharmacologic therapy if clinically indicated. Pt.  is  currently on statin therapy. Please place order for annual  screening scan for  09/2021 and fax results to PCP. Thanks so much.  Please let patient know there was notation of Aortic atherosclerosis. She is on statin therapy, and Hepatic steatosis. This  is a term that describes the build up of fat in the liver. It is normal to have small amounts of fat in your liver, but when the proportion of liver cells that contain fat exceeds more than 5% it is indicative of early stage fatty liver.Treatment often involves reducing risk factors through a diet and exercise plan. It is generally a benign condition, but in a small percentage of patients it does require follow up. Please have the patient follow up with PCP regarding potential risk factor modification, dietary therapy or pharmacologic therapy if clinically indicated. Thanks so much

## 2020-10-30 ENCOUNTER — Other Ambulatory Visit: Payer: Self-pay

## 2020-10-30 ENCOUNTER — Ambulatory Visit (INDEPENDENT_AMBULATORY_CARE_PROVIDER_SITE_OTHER): Payer: BC Managed Care – PPO | Admitting: Dermatology

## 2020-10-30 ENCOUNTER — Encounter: Payer: Self-pay | Admitting: *Deleted

## 2020-10-30 DIAGNOSIS — Z872 Personal history of diseases of the skin and subcutaneous tissue: Secondary | ICD-10-CM | POA: Diagnosis not present

## 2020-10-30 DIAGNOSIS — F17200 Nicotine dependence, unspecified, uncomplicated: Secondary | ICD-10-CM

## 2020-10-30 DIAGNOSIS — L900 Lichen sclerosus et atrophicus: Secondary | ICD-10-CM

## 2020-10-30 DIAGNOSIS — S70261D Insect bite (nonvenomous), right hip, subsequent encounter: Secondary | ICD-10-CM | POA: Diagnosis not present

## 2020-10-30 DIAGNOSIS — W57XXXD Bitten or stung by nonvenomous insect and other nonvenomous arthropods, subsequent encounter: Secondary | ICD-10-CM | POA: Diagnosis not present

## 2020-10-30 DIAGNOSIS — Z87891 Personal history of nicotine dependence: Secondary | ICD-10-CM

## 2020-10-30 MED ORDER — CLOBETASOL PROPIONATE 0.05 % EX OINT
1.0000 | TOPICAL_OINTMENT | Freq: Two times a day (BID) | CUTANEOUS | 1 refills | Status: DC
Start: 2020-10-30 — End: 2021-03-27

## 2020-10-30 NOTE — Patient Instructions (Signed)

## 2020-10-30 NOTE — Progress Notes (Signed)
Follow-Up Visit   Subjective  Linda Henson is a 65 y.o. female who presents for the following: Follow-up (Patient here today for lichen sclerosus follow up. Patient was not having any symptoms until recently and has started using the clobetasol. Patient advises she has not had any symptoms for about 6 months other than some mild incontinence. She does not have any itching or pain. /) and Actinic Keratosis (Patient here for 6 week AK follow up at left pretibia. Area treated at last appointment with LN2.).   The following portions of the chart were reviewed this encounter and updated as appropriate:   Tobacco  Allergies  Meds  Problems  Med Hx  Surg Hx  Fam Hx      Review of Systems:  No other skin or systemic complaints except as noted in HPI or Assessment and Plan.  Objective  Well appearing patient in no apparent distress; mood and affect are within normal limits.  A focused examination was performed including right hip, left pretibia, groin. Relevant physical exam findings are noted in the Assessment and Plan.  Pubic Mild erythema, erosion at medial labia majora Erythema and white discoloration perianal  Right Hip Deep pink erythematous papule without features suspicious for malignancy on dermoscopy    Assessment & Plan  Lichen sclerosus et atrophicus Pubic  Chronic condition with duration or expected duration over one year. Condition is bothersome to patient. Currently flared.  Lichen sclerosus is a chronic inflammatory condition that is NOT sexually transmitted. It requires regular monitoring and treatment to minimize inflammation to reduce risk of scarring. There is also a risk of cancer in the area which is very low if inflammation is well controlled. Regular checks of the area are recommended. Please call if you notice any new or changing spots within this area.   Continue clobetasol ointment twice daily for 3 more weeks then decrease to weekends only if  doing well.   Continue feminine pad to help with moisture. Change frequently. Recommend a protectant/zinc oxide in between applications of clobetasol to protect skin.    Topical steroids (such as triamcinolone, fluocinolone, fluocinonide, mometasone, clobetasol, halobetasol, betamethasone, hydrocortisone) can cause thinning and lightening of the skin if they are used for too long in the same area. Your physician has selected the right strength medicine for your problem and area affected on the body. Please use your medication only as directed by your physician to prevent side effects.     Related Medications clobetasol ointment (TEMOVATE) [id number] % Apply 1 application topically 2 (two) times daily. Apply to aa's BID PRN flares.  Insect bite of right hip, subsequent encounter Right Hip  May apply clobetasol ointment twice daily for up to 2 weeks. Avoid applying to face, groin, and axilla. Use as directed. Risk of skin atrophy with long-term use reviewed.   Discussed ILK injections if becomes bothersome.   History of PreCancerous Actinic Keratosis  - site(s) of PreCancerous Actinic Keratosis clear today. - these may recur and new lesions may form requiring treatment to prevent transformation into skin cancer - observe for new or changing spots and contact [place] for appointment if occur - photoprotection with sun protective clothing; sunglasses and broad spectrum sunscreen with SPF of at least 30 + and frequent self skin exams recommended - yearly exams by a dermatologist recommended for persons with history of PreCancerous Actinic Keratoses  Return in about 2 months (around [id number]) for lichen sclerosus.  Graciella Belton, RMA, am acting as scribe  for Forest Gleason, MD .  Documentation: I have reviewed the above documentation for accuracy and completeness, and I agree with the above.  Forest Gleason, MD

## 2020-11-05 ENCOUNTER — Encounter: Payer: Self-pay | Admitting: Dermatology

## 2020-11-06 ENCOUNTER — Encounter: Payer: Self-pay | Admitting: Family Medicine

## 2020-12-05 ENCOUNTER — Encounter: Payer: Self-pay | Admitting: Family Medicine

## 2020-12-05 ENCOUNTER — Other Ambulatory Visit: Payer: Self-pay

## 2020-12-05 ENCOUNTER — Ambulatory Visit (INDEPENDENT_AMBULATORY_CARE_PROVIDER_SITE_OTHER): Payer: BC Managed Care – PPO | Admitting: Family Medicine

## 2020-12-05 VITALS — BP 140/70 | HR 64 | Temp 97.6°F | Ht 65.0 in | Wt 216.4 lb

## 2020-12-05 DIAGNOSIS — E785 Hyperlipidemia, unspecified: Secondary | ICD-10-CM

## 2020-12-05 DIAGNOSIS — R0609 Other forms of dyspnea: Secondary | ICD-10-CM | POA: Insufficient documentation

## 2020-12-05 DIAGNOSIS — E6609 Other obesity due to excess calories: Secondary | ICD-10-CM | POA: Diagnosis not present

## 2020-12-05 DIAGNOSIS — Z6836 Body mass index (BMI) 36.0-36.9, adult: Secondary | ICD-10-CM

## 2020-12-05 DIAGNOSIS — E559 Vitamin D deficiency, unspecified: Secondary | ICD-10-CM | POA: Diagnosis not present

## 2020-12-05 DIAGNOSIS — I700 Atherosclerosis of aorta: Secondary | ICD-10-CM | POA: Diagnosis not present

## 2020-12-05 DIAGNOSIS — I10 Essential (primary) hypertension: Secondary | ICD-10-CM

## 2020-12-05 HISTORY — DX: Other forms of dyspnea: R06.09

## 2020-12-05 NOTE — Assessment & Plan Note (Signed)
Discussed risk factor management.  We will check a direct LDL and an A1c.  Check CMP as well.

## 2020-12-05 NOTE — Progress Notes (Signed)
Tommi Rumps, MD Phone: [phone]  Linda Henson is a 65 y.o. female who presents today for f/u.  Aortic atherosclerosis/mild cardiomegaly: This was seen on recent CT lung cancer screening.  She notes no chest pain.  She has stable dyspnea on exertion related to her COPD.  Notes it subsides within a couple of minutes.  She reports bilateral edema that goes down overnight.  She does report some orthopnea but this improves with use of her CPAP.  No PND.  She remains on amlodipine 5 mg once daily and Crestor.  She notes her father had an MI around her age.  Obesity: Patient notes her diet is bad.  She has fried foods intermittently.  She does drink 2 alcoholic beverages nightly.  She plays golf though rides in a cart.  No other exercise.  She wonders about Ozempic for her weight.  Vitamin D deficiency: She is currently taking 600 international units of vitamin D daily.  Social History   Tobacco Use  Smoking Status Former   Years: 4.00   Types: Cigarettes   Quit date: 12/15/1992   Years since quitting: 27.9  Smokeless Tobacco Never    Current Outpatient Medications on File Prior to Visit  Medication Sig Dispense Refill   amLODipine (NORVASC) 5 MG tablet Take 1 tablet (5 mg total) by mouth daily. 90 tablet 3   clobetasol ointment (TEMOVATE) 4.01 % Apply 1 application topically 2 (two) times daily. Apply to aa's BID PRN flares. 30 g 1   pantoprazole (PROTONIX) 40 MG tablet TAKE 1 TABLET BY MOUTH DAILY 90 tablet 1   rosuvastatin (CRESTOR) 20 MG tablet Take 1 tablet (20 mg total) by mouth daily. 90 tablet 3   No current facility-administered medications on file prior to visit.     ROS see history of present illness  Objective  Physical Exam Vitals:   12/05/20 1515  BP: 140/70  Pulse: 64  Temp: 97.6 F (36.4 C)  SpO2: 98%    BP Readings from Last 3 Encounters:  12/05/20 140/70  03/14/20 140/90  08/30/19 129/81   Wt Readings from Last 3 Encounters:  12/05/20 216  lb 6.4 oz (98.2 kg)  10/12/20 200 lb (90.7 kg)  02/28/20 182 lb (82.6 kg)    Physical Exam Constitutional:      General: She is not in acute distress.    Appearance: She is not diaphoretic.  Cardiovascular:     Rate and Rhythm: Normal rate and regular rhythm.     Heart sounds: Normal heart sounds.  Pulmonary:     Effort: Pulmonary effort is normal.     Breath sounds: Normal breath sounds.  Skin:    General: Skin is warm and dry.  Neurological:     Mental Status: She is alert.   EKG: Normal sinus rhythm, RSR prime, no ischemic changes noted, T wave inversion in lead III is stable from 2018  Assessment/Plan: Please see individual problem list.  Problem List Items Addressed This Visit     Aortic atherosclerosis ([place])    Discussed risk factor management.  We will check a direct LDL and an A1c.  Check CMP as well.      Relevant Orders   Direct LDL   Chronic dyspnea - Primary    This is a chronic issue likely related to her COPD.  Given her recent CT findings this could potentially be a cardiac issue with heart failure as a potential cause.  We will get an EKG today.  We  will get lab work.  Consider an echo based on lab findings.  Advised to seek medical attention for worsening breathing issues or development of chest pain.      Relevant Orders   EKG 12-Lead (Completed)   B Nat Peptide   Hyperlipidemia    Check direct LDL.  Continue Crestor 20 mg once daily.      Relevant Orders   Comp Met (CMET)   Direct LDL   Hypertension    We will consider changing her amlodipine to an alternative medicine once I see her lab results.      Obesity    Chronic issue.  Discussed that she would need to work on diet and exercise changes prior to considering medication for weight loss.  Encouraged healthier choices for her diet.  Discussed a calorie goal of about 1600 cal/day for a 1 pound weight loss per week.  Advised to hold off on increasing her activity until we have fully evaluated her  heart.      Relevant Orders   HgB A1c   Other Visit Diagnoses     Vitamin D deficiency       Relevant Orders   Vitamin D (25 hydroxy)       Return in about 6 weeks (around 01/16/2021) for Weight follow-up.  This visit occurred during the SARS-CoV-2 public health emergency.  Safety protocols were in place, including screening questions prior to the visit, additional usage of staff PPE, and extensive cleaning of exam room while observing appropriate contact time as indicated for disinfecting solutions.    Tommi Rumps, MD [place]

## 2020-12-05 NOTE — Assessment & Plan Note (Addendum)
This is a chronic issue likely related to her COPD.  Given her recent CT findings this could potentially be a cardiac issue with heart failure as a potential cause.  We will get an EKG today.  We will get lab work.  Consider an echo based on lab findings.  Advised to seek medical attention for worsening breathing issues or development of chest pain.

## 2020-12-05 NOTE — Assessment & Plan Note (Signed)
Chronic issue.  Discussed that she would need to work on diet and exercise changes prior to considering medication for weight loss.  Encouraged healthier choices for her diet.  Discussed a calorie goal of about 1600 cal/day for a 1 pound weight loss per week.  Advised to hold off on increasing her activity until we have fully evaluated her heart.

## 2020-12-05 NOTE — Assessment & Plan Note (Signed)
Check direct LDL.  Continue Crestor 20 mg once daily.

## 2020-12-05 NOTE — Patient Instructions (Signed)
Nice to see you. Please work on [employer].  You should cut down on your alcohol intake to 1 drink per night for several weeks and then try to cut out alcohol altogether.  I would suggest decreasing fried fatty foods.  A good calorie goal for 1 pound of weight loss per week is around 1600 cal.  Do not drop less than 1200 cal/day. If you start having worsening shortness of breath or you develop chest pain you need to be evaluated right away.

## 2020-12-05 NOTE — Assessment & Plan Note (Signed)
We will consider changing her amlodipine to an alternative medicine once I see her lab results.

## 2020-12-06 LAB — COMPREHENSIVE METABOLIC PANEL
ALT: 33 U/L (ref 0–35)
AST: 22 U/L (ref 0–37)
Albumin: 4.4 g/dL (ref 3.5–5.2)
Alkaline Phosphatase: 51 U/L (ref 39–117)
BUN: 12 mg/dL (ref 6–23)
CO2: 28 mEq/L (ref 19–32)
Calcium: 9.3 mg/dL (ref 8.4–10.5)
Chloride: 102 mEq/L (ref 96–112)
Creatinine, Ser: 0.9 mg/dL (ref 0.40–1.20)
GFR: 67.01 mL/min (ref >60.00)
Glucose, Bld: 125 mg/dL — ABNORMAL HIGH (ref 70–99)
Potassium: 3.9 mEq/L (ref 3.5–5.1)
Sodium: 139 mEq/L (ref 135–145)
Total Bilirubin: 0.5 mg/dL (ref 0.2–1.2)
Total Protein: 7.1 g/dL (ref 6.0–8.3)

## 2020-12-06 LAB — BRAIN NATRIURETIC PEPTIDE: Brain Natriuretic Peptide: 8 pg/mL (ref <100)

## 2020-12-06 LAB — LDL CHOLESTEROL, DIRECT: Direct LDL: 55 mg/dL

## 2020-12-06 LAB — VITAMIN D 25 HYDROXY (VIT D DEFICIENCY, FRACTURES): VITD: 26.42 ng/mL — ABNORMAL LOW (ref 30.00–100.00)

## 2020-12-06 LAB — HEMOGLOBIN A1C: Hgb A1c MFr Bld: 6.1 % (ref 4.6–6.5)

## 2020-12-15 ENCOUNTER — Other Ambulatory Visit: Payer: Self-pay | Admitting: Family Medicine

## 2020-12-15 DIAGNOSIS — I517 Cardiomegaly: Secondary | ICD-10-CM

## 2020-12-15 DIAGNOSIS — I10 Essential (primary) hypertension: Secondary | ICD-10-CM

## 2020-12-15 MED ORDER — VALSARTAN 160 MG PO TABS
160.0000 mg | ORAL_TABLET | Freq: Every day | ORAL | 3 refills | Status: DC
Start: 2020-12-15 — End: 2021-12-04

## 2020-12-17 ENCOUNTER — Telehealth: Payer: Self-pay | Admitting: Family Medicine

## 2020-12-17 ENCOUNTER — Other Ambulatory Visit: Payer: Self-pay | Admitting: Family Medicine

## 2020-12-17 NOTE — Telephone Encounter (Signed)
Patient had an Echo order placed by Dr Caryl Bis. She is on vacation this week and was wondering if that could be scheduled this week?   Patient informed that approvals and scheduling could take some time. States she is available everyday this week except for Thursday.   Please advise

## 2020-12-21 ENCOUNTER — Other Ambulatory Visit: Payer: Self-pay

## 2020-12-21 ENCOUNTER — Ambulatory Visit
Admission: RE | Admit: 2020-12-21 | Discharge: 2020-12-21 | Disposition: A | Discharge status: Home / Self Care | Payer: BC Managed Care – PPO | Source: Ambulatory Visit | Attending: Family Medicine | Admitting: Family Medicine

## 2020-12-21 DIAGNOSIS — I517 Cardiomegaly: Secondary | ICD-10-CM | POA: Diagnosis not present

## 2020-12-21 DIAGNOSIS — R0600 Dyspnea, unspecified: Secondary | ICD-10-CM | POA: Diagnosis not present

## 2020-12-21 DIAGNOSIS — E785 Hyperlipidemia, unspecified: Secondary | ICD-10-CM | POA: Diagnosis not present

## 2020-12-21 DIAGNOSIS — J449 Chronic obstructive pulmonary disease, unspecified: Secondary | ICD-10-CM | POA: Diagnosis not present

## 2020-12-21 DIAGNOSIS — G4730 Sleep apnea, unspecified: Secondary | ICD-10-CM | POA: Insufficient documentation

## 2020-12-21 LAB — ECHOCARDIOGRAM COMPLETE
AR max vel: 2.5 cm2
AV Area VTI: 2.76 cm2
AV Area mean vel: 2.13 cm2
AV Mean grad: 5 mmHg
AV Peak grad: 8.1 mmHg
Ao pk vel: 1.42 m/s
Area-P 1/2: 3.68 cm2
S' Lateral: 3.23 cm

## 2020-12-21 NOTE — Progress Notes (Signed)
*  PRELIMINARY RESULTS* Echocardiogram 2D Echocardiogram has been performed.  [name] 12/21/2020, 11:40 AM

## 2020-12-24 ENCOUNTER — Other Ambulatory Visit: Payer: Self-pay

## 2020-12-24 ENCOUNTER — Other Ambulatory Visit (INDEPENDENT_AMBULATORY_CARE_PROVIDER_SITE_OTHER): Payer: BC Managed Care – PPO

## 2020-12-24 DIAGNOSIS — I10 Essential (primary) hypertension: Secondary | ICD-10-CM

## 2020-12-24 LAB — BASIC METABOLIC PANEL
BUN: 10 mg/dL (ref 6–23)
CO2: 23 mEq/L (ref 19–32)
Calcium: 9.1 mg/dL (ref 8.4–10.5)
Chloride: 104 mEq/L (ref 96–112)
Creatinine, Ser: 0.82 mg/dL (ref 0.40–1.20)
GFR: 74.9 mL/min (ref >60.00)
Glucose, Bld: 126 mg/dL — ABNORMAL HIGH (ref 70–99)
Potassium: 4.1 mEq/L (ref 3.5–5.1)
Sodium: 137 mEq/L (ref 135–145)

## 2020-12-26 DIAGNOSIS — G4733 Obstructive sleep apnea (adult) (pediatric): Secondary | ICD-10-CM | POA: Diagnosis not present

## 2021-01-03 ENCOUNTER — Other Ambulatory Visit: Payer: Self-pay

## 2021-01-03 ENCOUNTER — Ambulatory Visit (INDEPENDENT_AMBULATORY_CARE_PROVIDER_SITE_OTHER): Payer: BC Managed Care – PPO | Admitting: Dermatology

## 2021-01-03 ENCOUNTER — Encounter: Payer: Self-pay | Admitting: Dermatology

## 2021-01-03 DIAGNOSIS — L900 Lichen sclerosus et atrophicus: Secondary | ICD-10-CM | POA: Diagnosis not present

## 2021-01-03 NOTE — Progress Notes (Signed)
   Follow-Up Visit   Subjective  Linda Henson is a 65 y.o. female who presents for the following: lichen sclerosus et atrophicus . Patient using Clobetasol 0.05% ointment QD a few days per week. She has no burning, itching, or pain.    The following portions of the chart were reviewed this encounter and updated as appropriate:   Tobacco  Allergies  Meds  Problems  Med Hx  Surg Hx  Fam Hx      Review of Systems:  No other skin or systemic complaints except as noted in HPI or Assessment and Plan.  Objective  Well appearing patient in no apparent distress; mood and affect are within normal limits.  A focused examination was performed including the vaginal area. Relevant physical exam findings are noted in the Assessment and Plan.  Vaginal area Erythematous and white patches med labial majora and perianal. Erosion at the L labia majora.    Assessment & Plan  Lichen sclerosus et atrophicus Vaginal area  Chronic condition with duration or expected duration over one year. Condition is bothersome to patient. Currently flared.  Lichen sclerosus is a chronic inflammatory condition of unknown cause that frequently involves the vaginal area and less commonly extragenital skin, and is NOT sexually transmitted. It frequently causes symptoms of pain and burning.  It requires regular monitoring and treatment with topical steroids to minimize inflammation and to reduce risk of scarring. There is also a risk of cancer in the vaginal area which is very low if inflammation is well controlled. Regular checks of the area are recommended. Please call if you notice any new or changing spots within this area.   Continue Clobetasol 0.05% ointment increasing to to aa's QD-BID.   If unable to use topicals regularly or to get good control with topicals, may consider plaquenil.  Discussed Plaquenil QD-BID. Hydroxychloroquine (plaquenil) can rarely cause irreversible damage to the retina of the eye if  used for a long period of time. This damage can be identified and the medication stopped before vision changes are noticed by going for a yearly ophthalmology exam. Rarely, this medicine can cause low blood counts, liver inflammation, and/or a color change of the skin.  The use of Plaquenil requires long term medication management, including periodic office visits and monitoring of blood work.   Related Medications clobetasol ointment (TEMOVATE) 4.08 % Apply 1 application topically 2 (two) times daily. Apply to aa's BID PRN flares.  Return for LS et A f/u in 6-8 weeks.  Luther Redo, CMA, am acting as scribe for Forest Gleason, MD .  Documentation: I have reviewed the above documentation for accuracy and completeness, and I agree with the above.  Forest Gleason, MD

## 2021-01-03 NOTE — Patient Instructions (Signed)

## 2021-01-28 ENCOUNTER — Ambulatory Visit: Payer: BC Managed Care – PPO | Admitting: Family Medicine

## 2021-02-14 ENCOUNTER — Encounter: Payer: Self-pay | Admitting: Family Medicine

## 2021-02-14 ENCOUNTER — Other Ambulatory Visit: Payer: Self-pay

## 2021-02-14 ENCOUNTER — Telehealth (INDEPENDENT_AMBULATORY_CARE_PROVIDER_SITE_OTHER): Payer: BC Managed Care – PPO | Admitting: Family Medicine

## 2021-02-14 VITALS — Ht 65.0 in | Wt 212.0 lb

## 2021-02-14 DIAGNOSIS — Z1231 Encounter for screening mammogram for malignant neoplasm of breast: Secondary | ICD-10-CM | POA: Diagnosis not present

## 2021-02-14 DIAGNOSIS — Z6835 Body mass index (BMI) 35.0-35.9, adult: Secondary | ICD-10-CM

## 2021-02-14 DIAGNOSIS — M7631 Iliotibial band syndrome, right leg: Secondary | ICD-10-CM

## 2021-02-14 DIAGNOSIS — E6609 Other obesity due to excess calories: Secondary | ICD-10-CM

## 2021-02-14 NOTE — Progress Notes (Signed)
Virtual Visit via video Note  This visit type was conducted due to national recommendations for restrictions regarding the COVID-19 pandemic (e.g. social distancing).  This format is felt to be most appropriate for this patient at this time.  All issues noted in this document were discussed and addressed.  No physical exam was performed (except for noted visual exam findings with Video Visits).   I connected with Linda Henson today at 11:30 AM EST by a video enabled telemedicine application and verified that I am speaking with the correct person using two identifiers. Location patient: home Location provider: home office Persons participating in the virtual visit: patient, provider  I discussed the limitations, risks, security and privacy concerns of performing an evaluation and management service by telephone and the availability of in person appointments. I also discussed with the patient that there may be a patient responsible charge related to this service. The patient expressed understanding and agreed to proceed.   Reason for visit: f/u  HPI: Obesity: Patient notes she is trying to stick to the 1600-calorie diet.  She is typically eating 2 meals a day and notes that is satisfying though she has not really lost much weight.  She is trying to walk more though that is somewhat limited by an IT band issue in her right leg.  She does report a history of possible gallstones.  No personal or family history of thyroid cancer, parathyroid cancer, or adrenal gland cancer.  No history of pancreatitis.  She is interested in Hodgkins.  Right leg discomfort: Patient notes this been going on recently.  She notes its in the area of her IT band.  If she bends over and reaches with her left arm she gets discomfort shooting in the right lateral thigh.  She notes the area is sore when she touches it.   ROS: See pertinent positives and negatives per HPI.  Past Medical History:  Diagnosis Date    Arthritis    spine - mild   Colon polyp    Repeat colonoscopy 2018   COPD (chronic obstructive pulmonary disease) (HCC)    Diverticulitis    GERD (gastroesophageal reflux disease)    Resolved with wt loss   Hepatitis C 2018   treated with Harvoni   Hyperlipidemia    Borderline    Iron deficiency anemia 2014   Sleep apnea    CPAP    Past Surgical History:  Procedure Laterality Date   ABDOMINAL HYSTERECTOMY  1994   CATARACT EXTRACTION W/PHACO Right 08/30/2019   Procedure: CATARACT EXTRACTION PHACO AND INTRAOCULAR LENS PLACEMENT ([place]) RIGHT;  Surgeon: Birder Robson, MD;  Location: [facility];  Service: Ophthalmology;  Laterality: Right;  2.16 0:20.2   COLONOSCOPY WITH PROPOFOL N/A 02/05/2015   Procedure: COLONOSCOPY WITH PROPOFOL;  Surgeon: Josefine Class, MD;  Location: [facility] ENDOSCOPY;  Service: Endoscopy;  Laterality: N/A;   EYE SURGERY     SALPINGOOPHORECTOMY Right 1994    Family History  Problem Relation Age of Onset   Heart disease Father 55       CAD - died of MI   Hyperlipidemia Father    Cancer Brother 72       stomach and esophageal cancer   Hypothyroidism Sister    Diabetes Paternal Grandmother    Breast cancer Neg Hx     SOCIAL HX: Former smoker   Current Outpatient Medications:    amLODipine (NORVASC) 5 MG tablet, Take 1 tablet (5 mg total) by mouth daily., Disp:  90 tablet, Rfl: 3   clobetasol ointment (TEMOVATE) 3.35 %, Apply 1 application topically 2 (two) times daily. Apply to aa's BID PRN flares., Disp: 30 g, Rfl: 1   pantoprazole (PROTONIX) 40 MG tablet, TAKE 1 TABLET BY MOUTH DAILY, Disp: 90 tablet, Rfl: 1   rosuvastatin (CRESTOR) 20 MG tablet, Take 1 tablet (20 mg total) by mouth daily., Disp: 90 tablet, Rfl: 3   valsartan (DIOVAN) 160 MG tablet, Take 1 tablet (160 mg total) by mouth daily., Disp: 90 tablet, Rfl: 3  EXAM:  VITALS per patient if applicable:  GENERAL: alert, oriented, appears well and in no acute distress  HEENT:  atraumatic, conjunttiva clear, no obvious abnormalities on inspection of external nose and ears  NECK: normal movements of the head and neck  LUNGS: on inspection no signs of respiratory distress, breathing rate appears normal, no obvious gross SOB, gasping or wheezing  CV: no obvious cyanosis  MS: moves all visible extremities without noticeable abnormality  PSYCH/NEURO: pleasant and cooperative, no obvious depression or anxiety, speech and thought processing grossly intact  ASSESSMENT AND PLAN:  Discussed the following assessment and plan:  Problem List Items Addressed This Visit     It band syndrome, right    History seems consistent with IT band syndrome.  We will provide her with exercises to complete.  If not improving she will let us know.      Obesity    I encouraged continued healthy diet.  Discussed adding in more exercise.  Discussed that wegovy could be an option for her though I wanted to check with our clinical pharmacist regarding the issue with the possible gallstone.  Counseled on the risk of pancreatitis.  Discussed the risk of nausea and vomiting as a side effect.  Advised to contact us immediately if she developed abdominal pain if we started the [facility].  Discussed that medullary thyroid cancer has been seen in rats studies though does not seem to translate into humans though if she notices any change in the area of her thyroid she would need to contact us right away.  I did discuss the risk of the gallbladder issue with the [facility].  She understood the need to contact us right away if she developed abdominal pain while on that medication.      Other Visit Diagnoses     Encounter for screening mammogram for malignant neoplasm of breast    -  Primary   Relevant Orders   MM 3D SCREEN BREAST BILATERAL      Health maintenance: Mammogram ordered for breast cancer screening.  The patient knows to call to schedule this.  She reports that GI advised her that she had a  7-year recall on the colonoscopy and that she would be due next year.  Return in about 3 months (around 05/15/2021).   I discussed the assessment and treatment plan with the patient. The patient was provided an opportunity to ask questions and all were answered. The patient agreed with the plan and demonstrated an understanding of the instructions.   The patient was advised to call back or seek an in-person evaluation if the symptoms worsen or if the condition fails to improve as anticipated.    Tommi Rumps, MD

## 2021-02-14 NOTE — Assessment & Plan Note (Signed)
I encouraged continued healthy diet.  Discussed adding in more exercise.  Discussed that wegovy could be an option for her though I wanted to check with our clinical pharmacist regarding the issue with the possible gallstone.  Counseled on the risk of pancreatitis.  Discussed the risk of nausea and vomiting as a side effect.  Advised to contact us immediately if she developed abdominal pain if we started the [facility].  Discussed that medullary thyroid cancer has been seen in rats studies though does not seem to translate into humans though if she notices any change in the area of her thyroid she would need to contact us right away.  I did discuss the risk of the gallbladder issue with the [facility].  She understood the need to contact us right away if she developed abdominal pain while on that medication.

## 2021-02-14 NOTE — Patient Instructions (Signed)
Iliotibial Band Syndrome Rehab Ask your health care provider which exercises are safe for you. Do exercises exactly as told by your health care provider and adjust them as directed. It is normal to feel mild stretching, pulling, tightness, or discomfort as you do these exercises. Stop right away if you feel sudden pain or your pain gets significantly worse. Do not begin these exercises until told by your health care provider. Stretching and range-of-motion exercises These exercises warm up your muscles and joints and improve the movement andflexibility of your hip and pelvis. Quadriceps stretch, prone  Lie on your abdomen (prone position) on a firm surface, such as a bed or padded floor. Bend your left / right knee and reach back to hold your ankle or pant leg. If you cannot reach your ankle or pant leg, loop a belt around your foot and grab the belt instead. Gently pull your heel toward your buttocks. Your knee should not slide out to the side. You should feel a stretch in the front of your thigh and knee (quadriceps). Hold this position for __________ seconds. Repeat __________ times. Complete this exercise __________ times a day. Iliotibial band stretch An iliotibial band is a strong band of muscle tissue that runs from the outer side of your hip to the outer side of your thigh and knee. Lie on your side with your left / right leg in the top position. Bend both of your knees and grab your left / right ankle. Stretch out your bottom arm to help you balance. Slowly bring your top knee back so your thigh goes behind your trunk. Slowly lower your top leg toward the floor until you feel a gentle stretch on the outside of your left / right hip and thigh. If you do not feel a stretch and your knee will not fall farther, place the heel of your other foot on top of your knee and pull your knee down toward the floor with your foot. Hold this position for __________ seconds. Repeat __________ times.  Complete this exercise __________ times a day. Strengthening exercises These exercises build strength and endurance in your hip and pelvis. Enduranceis the ability to use your muscles for a long time, even after they get tired. Straight leg raises, side-lying This exercise strengthens the muscles that rotate the leg at the hip and move it away from your body (hip abductors). Lie on your side with your left / right leg in the top position. Lie so your head, shoulder, hip, and knee line up. You may bend your bottom knee to help you balance. Roll your hips slightly forward so your hips are stacked directly over each other and your left / right knee is facing forward. Tense the muscles in your outer thigh and lift your top leg 4-6 inches (10-15 cm). Hold this position for __________ seconds. Slowly lower your leg to return to the starting position. Let your muscles relax completely before doing another repetition. Repeat __________ times. Complete this exercise __________ times a day. Leg raises, prone This exercise strengthens the muscles that move the hips backward (hip extensors). Lie on your abdomen (prone position) on your bed or a firm surface. You can put a pillow under your hips if that is more comfortable for your lower back. Bend your left / right knee so your foot is straight up in the air. Squeeze your buttocks muscles and lift your left / right thigh off the bed. Do not let your back arch. Tense your thigh   muscle as hard as you can without increasing any knee pain. Hold this position for __________ seconds. Slowly lower your leg to return to the starting position and allow it to relax completely. Repeat __________ times. Complete this exercise __________ times a day. Hip hike Stand sideways on a bottom step. Stand on your left / right leg with your other foot unsupported next to the step. You can hold on to a railing or wall for balance if needed. Keep your knees straight and your  torso square. Then lift your left / right hip up toward the ceiling. Slowly let your left / right hip lower toward the floor, past the starting position. Your foot should get closer to the floor. Do not lean or bend your knees. Repeat __________ times. Complete this exercise __________ times a day. This information is not intended to replace advice given to you by your health care provider. Make sure you discuss any questions you have with your healthcare provider. Document Revised: 05/11/2019 Document Reviewed: 05/11/2019 Elsevier Patient Education  2022 Elsevier Inc.  

## 2021-02-14 NOTE — Assessment & Plan Note (Signed)
History seems consistent with IT band syndrome.  We will provide her with exercises to complete.  If not improving she will let us know.

## 2021-02-20 ENCOUNTER — Ambulatory Visit (INDEPENDENT_AMBULATORY_CARE_PROVIDER_SITE_OTHER): Payer: BC Managed Care – PPO | Admitting: Dermatology

## 2021-02-20 ENCOUNTER — Other Ambulatory Visit: Payer: Self-pay

## 2021-02-20 DIAGNOSIS — L900 Lichen sclerosus et atrophicus: Secondary | ICD-10-CM

## 2021-02-20 NOTE — Progress Notes (Signed)
   Follow-Up Visit   Subjective  Linda Henson is a 65 y.o. female who presents for the following: Follow-up (Patient here today for 6 week LS et A follow up. Patient using clobetasol ointment 1-2 times daily.).  The following portions of the chart were reviewed this encounter and updated as appropriate:   Tobacco  Allergies  Meds  Problems  Med Hx  Surg Hx  Fam Hx      Review of Systems:  No other skin or systemic complaints except as noted in HPI or Assessment and Plan.  Objective  Well appearing patient in no apparent distress; mood and affect are within normal limits.  A focused examination was performed including vaginal area. Relevant physical exam findings are noted in the Assessment and Plan.  vaginal area Mild erythema, white discoloration Erosion left labia minora   Assessment & Plan  Lichen sclerosus et atrophicus vaginal area  Chronic condition with duration over one year. Condition is bothersome to patient. Currently flared.  Continue clobetasol 0.05% ointment twice daily to affected red areas and erosion. Recommend applying with a mirror to erosion.  If not well-controlled at f/u, may consider hydroxychloroquine  Lichen sclerosus is a chronic inflammatory condition of unknown cause that frequently involves the vaginal area and less commonly extragenital skin, and is NOT sexually transmitted. It frequently causes symptoms of pain and burning.  It requires regular monitoring and treatment with topical steroids to minimize inflammation and to reduce risk of scarring. There is also a risk of cancer in the vaginal area which is very low if inflammation is well controlled. Regular checks of the area are recommended. Please call if you notice any new or changing spots within this area.    Related Medications clobetasol ointment (TEMOVATE) 2.86 % Apply 1 application topically 2 (two) times daily. Apply to aa's BID PRN flares.  Return in about 4 weeks (around  05/22/1769) for lichen sclerosus .  Graciella Belton, RMA, am acting as scribe for Forest Gleason, MD .   Documentation: I have reviewed the above documentation for accuracy and completeness, and I agree with the above.  Forest Gleason, MD

## 2021-02-20 NOTE — Patient Instructions (Signed)
Continue clobetasol 0.05% ointment twice daily to affected red areas  If You Need Anything After Your Visit  If you have any questions or concerns for your doctor, please call our main line at [phone] and press option 4 to reach your doctor's medical assistant. If no one answers, please leave a voicemail as directed and we will return your call as soon as possible. Messages left after 4 pm will be answered the following business day.   You may also send [place] a message via [place]. We typically respond to MyChart messages within 1-2 business days.  For prescription refills, please ask your pharmacy to contact our office. Our fax number is [phone].  If you have an urgent issue when the clinic is closed that cannot wait until the next business day, you can page your doctor at the number below.    Please note that while we do our best to be available for urgent issues outside of office hours, we are not available 24/7.   If you have an urgent issue and are unable to reach [place], you may choose to seek medical care at your doctor's office, retail clinic, urgent care center, or emergency room.  If you have a medical emergency, please immediately call 911 or go to the emergency department.  Pager Numbers  - Dr. Nehemiah Massed: [phone]  - Dr. Laurence Ferrari: [phone]  - Dr. Nicole Kindred: [phone]  In the event of inclement weather, please call our main line at [phone] for an update on the status of any delays or closures.  Dermatology Medication Tips: Please keep the boxes that topical medications come in in order to help keep track of the instructions about where and how to use these. Pharmacies typically print the medication instructions only on the boxes and not directly on the medication tubes.   If your medication is too expensive, please contact our office at [phone] option 4 or send [place] a message through [place].   We are unable to tell what your co-pay for medications will be  in advance as this is different depending on your insurance coverage. However, we may be able to find a substitute medication at lower cost or fill out paperwork to get insurance to cover a needed medication.   If a prior authorization is required to get your medication covered by your insurance company, please allow [place] 1-2 business days to complete this process.  Drug prices often vary depending on where the prescription is filled and some pharmacies may offer cheaper prices.  The website www.goodrx.com contains coupons for medications through different pharmacies. The prices here do not account for what the cost may be with help from insurance (it may be cheaper with your insurance), but the website can give you the price if you did not use any insurance.  - You can print the associated coupon and take it with your prescription to the pharmacy.  - You may also stop by our office during regular business hours and pick up a GoodRx coupon card.  - If you need your prescription sent electronically to a different pharmacy, notify our office through [facility] or by phone at [phone] option 4.     Si Usted Necesita Algo Despus de Su Visita  Tambin puede enviarnos un mensaje a travs de Pharmacist, community. Por lo general respondemos a los mensajes de MyChart en el transcurso de 1 a 2 das hbiles.  Para renovar recetas, por favor pida a su farmacia que se ponga en contacto con Cleotis Nipper  oficina. Harland Dingwall de fax es [place] [phone].  Si tiene un asunto urgente cuando la clnica est cerrada y que no puede esperar hasta el siguiente da hbil, puede llamar/localizar a su doctor(a) al nmero que aparece a continuacin.   Por favor, tenga en cuenta que aunque hacemos todo lo posible para estar disponibles para asuntos urgentes fuera del horario de [place], no estamos disponibles las 24 horas del da, los 7 das de la [place] [facility].   Si tiene un problema urgente y no puede comunicarse con nosotros,  puede optar por buscar atencin mdica  en el consultorio de su doctor(a), en una clnica privada, en un centro de atencin urgente o en una sala de emergencias.  Si tiene Engineering geologist, por favor llame inmediatamente al 911 o vaya a la sala de emergencias.  Nmeros de bper  - Dr. Nehemiah Massed: [phone]  - Dra. Moye: [phone]  - Dra. Nicole Kindred: [phone]  En caso de inclemencias del [place], por favor llame a Johnsie Kindred principal al [phone] para una actualizacin sobre el [place] de cualquier retraso o cierre.  Consejos para la medicacin en dermatologa: Por favor, guarde las cajas en las que vienen los medicamentos de uso tpico para ayudarle a seguir las instrucciones sobre dnde y cmo usarlos. Las farmacias generalmente imprimen las instrucciones del medicamento slo en las cajas y no directamente en los tubos del [place].   Si su medicamento es muy caro, por favor, pngase en contacto con Zigmund Daniel llamando al [phone] y presione la opcin 4 o envenos un mensaje a travs de Pharmacist, community.   No podemos decirle cul ser su copago por los medicamentos por adelantado ya que esto es diferente dependiendo de la cobertura de su seguro. Sin embargo, es posible que podamos encontrar un medicamento sustituto a Electrical engineer un formulario para que el seguro cubra el medicamento que se considera necesario.   Si se requiere una autorizacin previa para que su compaa de seguros [place] su medicamento, por favor permtanos de 1 a 2 das hbiles para completar este proceso.  Los precios de los medicamentos varan con frecuencia dependiendo del Environmental consultant de dnde se surte la receta y alguna farmacias pueden ofrecer precios ms baratos.  El sitio web www.goodrx.com tiene cupones para medicamentos de Airline pilot. Los precios aqu no tienen en cuenta lo que podra costar con la ayuda del seguro (puede ser ms barato con su seguro), pero el sitio web puede darle el  precio si no utiliz Research scientist (physical sciences).  - Puede imprimir el cupn correspondiente y llevarlo con su receta a la farmacia.  - Tambin puede pasar por nuestra oficina durante el horario de atencin regular y Charity fundraiser una tarjeta de cupones de GoodRx.  - Si necesita que su receta se enve electrnicamente a una farmacia diferente, informe a nuestra oficina a travs de MyChart de  [place] o por telfono llamando al [phone] y presione la opcin 4.

## 2021-02-22 ENCOUNTER — Telehealth: Payer: Self-pay | Admitting: Family Medicine

## 2021-02-22 MED ORDER — SEMAGLUTIDE-WEIGHT MANAGEMENT 2.4 MG/0.75ML ~~LOC~~ SOAJ: 2.4000 mg | SUBCUTANEOUS | 0 refills | Status: AC

## 2021-02-22 MED ORDER — SEMAGLUTIDE-WEIGHT MANAGEMENT 0.25 MG/0.5ML ~~LOC~~ SOAJ: 0.2500 mg | SUBCUTANEOUS | 0 refills | Status: AC

## 2021-02-22 MED ORDER — SEMAGLUTIDE-WEIGHT MANAGEMENT 1 MG/0.5ML ~~LOC~~ SOAJ: 1.0000 mg | SUBCUTANEOUS | 0 refills | Status: AC

## 2021-02-22 MED ORDER — SEMAGLUTIDE-WEIGHT MANAGEMENT 1.7 MG/0.75ML ~~LOC~~ SOAJ: 1.7000 mg | SUBCUTANEOUS | 0 refills | Status: AC

## 2021-02-22 MED ORDER — SEMAGLUTIDE-WEIGHT MANAGEMENT 0.5 MG/0.5ML ~~LOC~~ SOAJ: 0.5000 mg | SUBCUTANEOUS | 0 refills | Status: AC

## 2021-02-22 NOTE — Telephone Encounter (Signed)
Please let the patient know that I heard back from the pharmacist on the wegovy and the fact that she had a possible gallstone previously.  We both think it is reasonable to try the wegovy if we can get it approved by her insurance.  She needs to be seen immediately if she develops abdominal pain or discomfort and she would need to stop the [facility] right away if that occurred.  I sent this to her pharmacy.  If its not affordable she needs to let us know.

## 2021-02-22 NOTE — Telephone Encounter (Signed)
-----   Message from De Hollingshead, [place] sent at 02/14/2021 12:14 PM EST ----- Hey,   I would say it is appropriate to use, but with appropriate counseling to immediately stop if abdominal discomfort/pain and seek evaluation.   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 But if she's doing well, you could switch to Ozempic at that time (pending insurance coverage without needing a documented diagnosis).   Feel free to refer to me to help navigating all that, if needed.   Catie  ----- Message ----- From: Leone Haven, MD Sent: 02/14/2021  11:53 AM EST To: De Hollingshead, RPH-CPP  Hey Catie,  I wanted to start this patient on wegovy for her weight, though noted that she had a possible gall stone on prior US imaging. Would that preclude the use of this medication given the risk of gall bladder issues with wegovy? Or would it be fine to use with appropriate counseling? Thanks.   Randall Hiss

## 2021-02-25 NOTE — Telephone Encounter (Signed)
I just received an appeal form so I sent that in on cover my mail so lets wait for that outcome.  Radie Berges,cma

## 2021-02-25 NOTE — Telephone Encounter (Signed)
Patient's insurance denied paying for the wegovy and they put a note on the paperwork stating The manufacturer of Wegovy requested that prescribers refrain from starting new patients on Wegovy due to national shortage of the product.  It also stated that new requests for coverage of Mancel Parsons will be considered when the shortage is resolved. Please consider an alternative therapy as appropriate until the shortage is resolved.  Linda Henson,acma

## 2021-02-25 NOTE — Telephone Encounter (Signed)
Noted.  Did they give any alternatives that we could prescribe?

## 2021-02-26 ENCOUNTER — Telehealth: Payer: Self-pay

## 2021-02-26 NOTE — Telephone Encounter (Signed)
Signed.  Please fax back. 

## 2021-02-26 NOTE — Telephone Encounter (Signed)
Noted. Please follow-up on this in the next couple of days.

## 2021-02-27 ENCOUNTER — Encounter: Payer: Self-pay | Admitting: Family Medicine

## 2021-02-27 ENCOUNTER — Encounter: Payer: Self-pay | Admitting: Dermatology

## 2021-03-05 NOTE — Progress Notes (Signed)
Acute Office Visit  Subjective:    Patient ID: Linda Henson, female    DOB: May 22, 1955, 65 y.o.   MRN: [id number]  Chief Complaint  Patient presents with   Abdominal Pain    Started a couple of weeks ago    HPI Patient is in today for chronic left sided abdominal pain. She has had some right upper quadrant pain as well.  Onset of this was with Dr. Caryl Bis PCP regarding his abdominal pain. She does have hernia on right side Taking Protonix for GERD PRN 40 mg  once daily she reports reflux symptoms are not significant, occasionally has after drinking certain foods and also with laying down.   CT 09/29/2018 showed fatty liver and small moderate hiatal hernia.   She has a hemorrhoid that occasionally bleeds, stools were lighter incolor a couple weeks ago and now returned to normal.  Denies any blood in stools or mucus in stool.  She is on the side of constipation, she is not taking any over-the-counter fiber supplements or tried anything for constipation.  She has had a bowel movement within the last few days.  She has not started [facility] yet.   Denies any injury.  Patient  denies any fever, body aches,chills, rash, chest pain, shortness of breath, nausea, vomiting, or diarrhea.   Past Medical History:  Diagnosis Date   Arthritis    spine - mild   Colon polyp    Repeat colonoscopy 2018   COPD (chronic obstructive pulmonary disease) (HCC)    Diverticulitis    GERD (gastroesophageal reflux disease)    Resolved with wt loss   Hepatitis C 2018   treated with Harvoni   Hyperlipidemia    Borderline    Iron deficiency anemia 2014   Sleep apnea    CPAP    Past Surgical History:  Procedure Laterality Date   ABDOMINAL HYSTERECTOMY  1994   CATARACT EXTRACTION W/PHACO Right 08/30/2019   Procedure: CATARACT EXTRACTION PHACO AND INTRAOCULAR LENS PLACEMENT ([place]) RIGHT;  Surgeon: Birder Robson, MD;  Location: [place];  Service: Ophthalmology;  Laterality: Right;   2.16 0:20.2   COLONOSCOPY WITH PROPOFOL N/A 02/05/2015   Procedure: COLONOSCOPY WITH PROPOFOL;  Surgeon: Josefine Class, MD;  Location: [facility] ENDOSCOPY;  Service: Endoscopy;  Laterality: N/A;   EYE SURGERY     SALPINGOOPHORECTOMY Right 1994    Family History  Problem Relation Age of Onset   Heart disease Father 6       CAD - died of MI   Hyperlipidemia Father    Cancer Brother 39       stomach and esophageal cancer   Hypothyroidism Sister    Diabetes Paternal Grandmother    Breast cancer Neg Hx     Social History   Socioeconomic History   Marital status: Widowed    Spouse name: Not on file   Number of children: 1   Years of education: 14   Highest education level: Not on file  Occupational History   Occupation: Dedicated Hydrologist: [employer]    Comment: [employer]  Tobacco Use   Smoking status: Former    Years: 4.00    Types: Cigarettes    Quit date: 12/15/1992    Years since quitting: 28.2   Smokeless tobacco: Never  Vaping Use   Vaping Use: Never used  Substance and Sexual Activity   Alcohol use: Yes    Alcohol/week: 14.0 standard drinks    Types: 14 Standard  drinks or equivalent per week   Drug use: No   Sexual activity: Not Currently    Birth control/protection: Surgical  Other Topics Concern   Not on file  Social History Narrative   Naava grew up in [place], [place]. She is widowed for 5 years. She lives at home with her 4 year old mother. 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 She enjoys playing golf.    Social Determinants of Health   Financial Resource Strain: Not on file  Food Insecurity: Not on file  Transportation Needs: Not on file  Physical Activity: Not on file  Stress: Not on file  Social Connections: Not on file  Intimate Partner Violence: Not on file    Outpatient Medications Prior to Visit  Medication Sig Dispense Refill   clobetasol ointment (TEMOVATE) 2.40 % Apply 1  application topically 2 (two) times daily. Apply to aa's BID PRN flares. 30 g 1   pantoprazole (PROTONIX) 40 MG tablet TAKE 1 TABLET BY MOUTH DAILY 90 tablet 1   rosuvastatin (CRESTOR) 20 MG tablet Take 1 tablet (20 mg total) by mouth daily. 90 tablet 3   Semaglutide-Weight Management 0.25 MG/0.5ML SOAJ Inject 0.25 mg into the skin once a week for 28 days. 2 mL 0   [START ON 03/23/2021] Semaglutide-Weight Management 0.5 MG/0.5ML SOAJ Inject 0.5 mg into the skin once a week for 28 days. 2 mL 0   [START ON 04/21/2021] Semaglutide-Weight Management 1 MG/0.5ML SOAJ Inject 1 mg into the skin once a week for 28 days. 2 mL 0   [START ON 05/20/2021] Semaglutide-Weight Management 1.7 MG/0.75ML SOAJ Inject 1.7 mg into the skin once a week for 28 days. 3 mL 0   [START ON 06/18/2021] Semaglutide-Weight Management 2.4 MG/0.75ML SOAJ Inject 2.4 mg into the skin once a week for 28 days. 3 mL 0   valsartan (DIOVAN) 160 MG tablet Take 1 tablet (160 mg total) by mouth daily. 90 tablet 3   amLODipine (NORVASC) 5 MG tablet Take 1 tablet (5 mg total) by mouth daily. 90 tablet 3   No facility-administered medications prior to visit.    No Known Allergies  Review of Systems  Constitutional:  Negative for activity change, appetite change, chills, diaphoresis, fatigue, fever and unexpected weight change.  HENT: Negative.    Respiratory: Negative.    Cardiovascular: Negative.   Gastrointestinal:  Positive for abdominal distention, constipation and nausea. Negative for abdominal pain, anal bleeding, blood in stool, diarrhea, rectal pain and vomiting.  Genitourinary: Negative.   Musculoskeletal: Negative.   Neurological: Negative.   Psychiatric/Behavioral: Negative.        Objective:    Physical Exam Vitals reviewed.  Constitutional:      General: She is not in acute distress.    Appearance: She is well-developed. She is not diaphoretic.     Interventions: She is not intubated. HENT:     Head: Normocephalic and  atraumatic.     Right Ear: External ear normal.     Left Ear: External ear normal.     Nose: Nose normal.     Mouth/Throat:     Pharynx: No oropharyngeal exudate.  Eyes:     General: Lids are normal. No scleral icterus.       Right eye: No discharge.        Left eye: No discharge.     Conjunctiva/sclera: Conjunctivae normal.     Right eye: Right conjunctiva is not injected.  No exudate or hemorrhage.    Left eye: Left conjunctiva is not injected. No exudate or hemorrhage.    Pupils: Pupils are equal, round, and reactive to light.  Neck:     Thyroid: No thyroid mass or thyromegaly.     Vascular: Normal carotid pulses. No carotid bruit, hepatojugular reflux or JVD.     Trachea: Trachea and phonation normal. No tracheal tenderness or tracheal deviation.     Meningeal: Brudzinski's sign and Kernig's sign absent.  Cardiovascular:     Rate and Rhythm: Normal rate and regular rhythm.     Pulses: Normal pulses.          Radial pulses are 2+ on the right side and 2+ on the left side.       Dorsalis pedis pulses are 2+ on the right side and 2+ on the left side.       Posterior tibial pulses are 2+ on the right side and 2+ on the left side.     Heart sounds: Normal heart sounds, S1 normal and S2 normal. Heart sounds not distant. No murmur heard.   No friction rub. No gallop.  Pulmonary:     Effort: Pulmonary effort is normal. No tachypnea, bradypnea, accessory muscle usage or respiratory distress. She is not intubated.     Breath sounds: Normal breath sounds. No stridor. No wheezing or rales.  Chest:     Chest wall: No tenderness.  Abdominal:     General: Bowel sounds are normal. There is no distension or abdominal bruit.     Palpations: Abdomen is soft. There is no shifting dullness, fluid wave, hepatomegaly, splenomegaly, mass or pulsatile mass.     Tenderness: There is abdominal tenderness in the right upper quadrant, right lower quadrant, periumbilical area and left upper quadrant. There  is no right CVA tenderness, left CVA tenderness, guarding or rebound.     Hernia: No hernia is present.     Comments: Right lower quadrant and right upper quadrant tender with deep palpation.  Left upper quadrant pain also noted  Musculoskeletal:        General: No tenderness or deformity. Normal range of motion.     Cervical back: Full passive range of motion without pain, normal range of motion and neck supple. No edema, erythema or rigidity. No spinous process tenderness or muscular tenderness. Normal range of motion.  Lymphadenopathy:     Head:     Right side of head: No submental, submandibular, tonsillar, preauricular, posterior auricular or occipital adenopathy.     Left side of head: No submental, submandibular, tonsillar, preauricular, posterior auricular or occipital adenopathy.     Cervical: No cervical adenopathy.     Right cervical: No superficial, deep or posterior cervical adenopathy.    Left cervical: No superficial, deep or posterior cervical adenopathy.     Upper Body:     Right upper body: No supraclavicular or pectoral adenopathy.     Left upper body: No supraclavicular or pectoral adenopathy.  Skin:    General: Skin is warm and dry.     Coloration: Skin is not pale.     Findings: No abrasion, bruising, burn, ecchymosis, erythema, lesion, petechiae or rash.     Nails: There is no clubbing.  Neurological:     Mental Status: She is alert and oriented to person, place, and time.     GCS: GCS eye subscore is 4. GCS verbal subscore is 5. GCS motor subscore is 6.     Cranial  Nerves: No cranial nerve deficit.     Sensory: No sensory deficit.     Motor: No tremor, atrophy, abnormal muscle tone or seizure activity.     Coordination: Coordination normal.     Gait: Gait normal.     Deep Tendon Reflexes: Reflexes are normal and symmetric. Reflexes normal. Babinski sign absent on the right side. Babinski sign absent on the left side.     Reflex Scores:      Tricep reflexes are  2+ on the right side and 2+ on the left side.      Bicep reflexes are 2+ on the right side and 2+ on the left side.      Brachioradialis reflexes are 2+ on the right side and 2+ on the left side.      Patellar reflexes are 2+ on the right side and 2+ on the left side.      Achilles reflexes are 2+ on the right side and 2+ on the left side. Psychiatric:        Speech: Speech normal.        Behavior: Behavior normal.        Thought Content: Thought content normal.        Judgment: Judgment normal.   Patient is alert and oriented and responsive to questions Engages in eye contact with provider. Speaks in full sentences without any pauses without any shortness of breath or distress.   BP 132/84    Pulse (!) 56    Temp (!) 96.2 F (35.7 C)    Resp 18    Ht 5\' 5"  (1.651 m)    Wt 213 lb 12.8 oz (97 kg)    SpO2 95%    BMI 35.58 kg/m  Wt Readings from Last 3 Encounters:  03/06/21 213 lb 12.8 oz (97 kg)  02/14/21 212 lb (96.2 kg)  12/05/20 216 lb 6.4 oz (98.2 kg)    Health Maintenance Due  Topic Date Due   Pneumonia Vaccine 85+ Years old (1 - PCV) Never done   COVID-19 Vaccine (3 - Moderna risk series) 07/21/2019   COLONOSCOPY (Pts 45-43yrs Insurance coverage will need to be confirmed)  02/05/2020   DEXA SCAN  Never done    There are no preventive care reminders to display for this patient.   Lab Results  Component Value Date   TSH 1.62 03/04/2019   Lab Results  Component Value Date   WBC 8.9 03/04/2019   HGB 14.4 03/04/2019   HCT 41.2 03/04/2019   MCV 98.2 03/04/2019   PLT 199.0 03/04/2019   Lab Results  Component Value Date   NA 137 12/24/2020   K 4.1 12/24/2020   CO2 23 12/24/2020   GLUCOSE 126 (H) 12/24/2020   BUN 10 12/24/2020   CREATININE 0.82 12/24/2020   BILITOT 0.5 12/05/2020   ALKPHOS 51 12/05/2020   AST 22 12/05/2020   ALT 33 12/05/2020   PROT 7.1 12/05/2020   ALBUMIN 4.4 12/05/2020   CALCIUM 9.1 12/24/2020   GFR 74.90 12/24/2020   Lab Results   Component Value Date   CHOL 235 (H) 03/14/2020   Lab Results  Component Value Date   HDL 57.70 03/14/2020   Lab Results  Component Value Date   LDLCALC 139 (H) 08/24/2019   Lab Results  Component Value Date   TRIG 267.0 (H) 03/14/2020   Lab Results  Component Value Date   CHOLHDL 4 03/14/2020   Lab Results  Component Value Date   HGBA1C 6.1  12/05/2020       Assessment & Plan:   Problem List Items Addressed This Visit   None  The primary encounter diagnosis was RUQ pain. Diagnoses of RLQ abdominal pain, LUQ pain, and Periumbilical abdominal pain were also pertinent to this visit.    Orders Placed This Encounter  Procedures   CT Abdomen Pelvis W Contrast   US Abdomen Limited RUQ (LIVER/GB)   CBC with Differential/Platelet   TSH   Comprehensive metabolic panel   Lipase   Amylase   Ambulatory referral to Gastroenterology    STAT  CT abdomen to rule out questionable appendicitis, gallbladder, liver involvement.  Labs now.  DO recommend after initial work up to start Metamucil or equivalent once nightly to help with constipation.  Referred to gastroenterology Tammi Klippel for work up for endoscopy, colonoscopy and any persistent ongoing abdominal pain.   Strict return/ ER precautions given.  No orders of the defined types were placed in this encounter.  Red Flags discussed. The patient was given clear instructions to go to ER or return to medical center if any red flags develop, symptoms do not improve, worsen or new problems develop. They verbalized understanding. An after visit summary was printed and given to patient and sent to Mychart if patient has signed up as advised.   An After Visit Summary was printed and given to the patient.  Return in about 2 weeks (around 03/20/2021), or if symptoms worsen or fail to improve, for at any time for any worsening symptoms.  Marcille Buffy, FNP

## 2021-03-06 ENCOUNTER — Ambulatory Visit (INDEPENDENT_AMBULATORY_CARE_PROVIDER_SITE_OTHER): Payer: BC Managed Care – PPO | Admitting: Adult Health

## 2021-03-06 ENCOUNTER — Other Ambulatory Visit: Payer: Self-pay

## 2021-03-06 ENCOUNTER — Encounter: Payer: Self-pay | Admitting: Adult Health

## 2021-03-06 VITALS — BP 128/72 | HR 76 | Temp 97.2°F | Resp 18 | Ht 65.0 in | Wt 213.8 lb

## 2021-03-06 DIAGNOSIS — R1033 Periumbilical pain: Secondary | ICD-10-CM | POA: Insufficient documentation

## 2021-03-06 DIAGNOSIS — K760 Fatty (change of) liver, not elsewhere classified: Secondary | ICD-10-CM | POA: Insufficient documentation

## 2021-03-06 DIAGNOSIS — R1011 Right upper quadrant pain: Secondary | ICD-10-CM | POA: Insufficient documentation

## 2021-03-06 DIAGNOSIS — R1031 Right lower quadrant pain: Secondary | ICD-10-CM | POA: Diagnosis not present

## 2021-03-06 DIAGNOSIS — R1012 Left upper quadrant pain: Secondary | ICD-10-CM | POA: Diagnosis not present

## 2021-03-06 LAB — CBC WITH DIFFERENTIAL/PLATELET
Basophils Absolute: 0 10*3/uL (ref 0.0–0.1)
Basophils Relative: 0.2 % (ref 0.0–3.0)
Eosinophils Absolute: 0.2 10*3/uL (ref 0.0–0.7)
Eosinophils Relative: 2.3 % (ref 0.0–5.0)
HCT: 41.5 % (ref 36.0–46.0)
Hemoglobin: 14 g/dL (ref 12.0–15.0)
Lymphocytes Relative: 32.8 % (ref 12.0–46.0)
Lymphs Abs: 2.6 10*3/uL (ref 0.7–4.0)
MCHC: 33.8 g/dL (ref 30.0–36.0)
MCV: 98.5 fl (ref 78.0–100.0)
Monocytes Absolute: 0.5 10*3/uL (ref 0.1–1.0)
Monocytes Relative: 5.6 % (ref 3.0–12.0)
Neutro Abs: 4.7 10*3/uL (ref 1.4–7.7)
Neutrophils Relative %: 59.1 % (ref 43.0–77.0)
Platelets: 159 10*3/uL (ref 150.0–400.0)
RBC: 4.22 Mil/uL (ref 3.87–5.11)
RDW: 13 % (ref 11.5–15.5)
WBC: 8 10*3/uL (ref 4.0–10.5)

## 2021-03-06 LAB — COMPREHENSIVE METABOLIC PANEL
ALT: 28 U/L (ref 0–35)
AST: 19 U/L (ref 0–37)
Albumin: 4.4 g/dL (ref 3.5–5.2)
Alkaline Phosphatase: 46 U/L (ref 39–117)
BUN: 13 mg/dL (ref 6–23)
CO2: 28 mEq/L (ref 19–32)
Calcium: 9.5 mg/dL (ref 8.4–10.5)
Chloride: 100 mEq/L (ref 96–112)
Creatinine, Ser: 0.85 mg/dL (ref 0.40–1.20)
GFR: 71.64 mL/min (ref >60.00)
Glucose, Bld: 119 mg/dL — ABNORMAL HIGH (ref 70–99)
Potassium: 4.3 mEq/L (ref 3.5–5.1)
Sodium: 136 mEq/L (ref 135–145)
Total Bilirubin: 0.7 mg/dL (ref 0.2–1.2)
Total Protein: 7 g/dL (ref 6.0–8.3)

## 2021-03-06 LAB — TSH: TSH: 1.59 u[IU]/mL (ref 0.35–5.50)

## 2021-03-06 LAB — LIPASE: Lipase: 24 U/L (ref 11.0–59.0)

## 2021-03-06 LAB — AMYLASE: Amylase: 31 U/L (ref 27–131)

## 2021-03-06 NOTE — Patient Instructions (Addendum)
Psyllium oral capsule What is this medication? PSYLLIUM (SIL i yum) is a bulk-forming fiber laxative. This medicine is used to treat constipation. Increasing fiber in the diet may also help lower cholesterol and promote heart health for some people. This medicine may be used for other purposes; ask your health care provider or pharmacist if you have questions. COMMON BRAND NAME(S): GenFiber, Konsyl, Metamucil, Metamucil MultiHealth, Natural Fiber Laxative, Reguloid What should I tell my care team before I take this medication? They need to know if you have any of these conditions: blockage in your bowel difficulty swallowing inflammatory bowel disease stomach or intestine problems sudden change in bowel habits lasting more than 2 weeks an unusual or allergic reaction to psyllium, other medicines, dyes, or preservatives pregnant or trying or get pregnant breast-feeding How should I use this medication? Take this medicine by mouth with a full glass of water. Follow the directions on the package labeling, or take as directed by your health care professional. Take your medicine at regular intervals. Do not take your medicine more often than directed. Talk to your pediatrician regarding the use of this medicine in children. While this drug may be prescribed for children as young as 43 years of age for selected conditions, precautions do apply. Overdosage: If you think you have taken too much of this medicine contact a poison control center or emergency room at once. NOTE: This medicine is only for you. Do not share this medicine with others. What if I miss a dose? If you miss a dose, take it as soon as you can. If it is almost time for your next dose, take only that dose. Do not take double or extra doses. What may interact with this medication? Interactions are not expected. Take this product at least 2 hours before or after other medicines. This list may not describe all possible interactions.  Give your health care provider a list of all the medicines, herbs, non-prescription drugs, or dietary supplements you use. Also tell them if you smoke, drink alcohol, or use illegal drugs. Some items may interact with your medicine. What should I watch for while using this medication? Check with your doctor or health care professional if your symptoms do not start to get better or if they get worse. Stop using this medicine and contact your doctor or health care professional if you have rectal bleeding or if you have to treat your constipation for more than 1 week. These could be signs of a more serious condition. Drink several glasses of water a day while you are taking this medicine. This will help to relieve constipation and prevent dehydration. What side effects may I notice from receiving this medication? Side effects that you should report to your doctor or health care professional as soon as possible: allergic reactions like skin rash, itching or hives, swelling of the face, lips, or tongue breathing problems chest pain nausea, vomiting rectal bleeding trouble swallowing Side effects that usually do not require medical attention (report to your doctor or health care professional if they continue or are bothersome): bloating gas stomach cramps This list may not describe all possible side effects. Call your doctor for medical advice about side effects. You may report side effects to FDA at 1-800-FDA-1088. Where should I keep my medication? Keep out of the reach of children. Store at room temperature between 15 and 30 degrees C (59 and 86 degrees F). Protect from moisture. Throw away any unused medicine after the expiration date. NOTE: This  sheet is a summary. It may not cover all possible information. If you have questions about this medicine, talk to your doctor, pharmacist, or health care provider.  2022 Elsevier/Gold Standard (2017-07-28 00:00:00) Abdominal Pain, Adult Many things  can cause belly (abdominal) pain. Most times, belly pain is not dangerous. Many cases of belly pain can be watched and treated at home. Sometimes, though, belly pain is serious. Your doctor will try to find the cause of your belly pain. Follow these instructions at home: Medicines Take over-the-counter and prescription medicines only as told by your doctor. Do not take medicines that help you poop (laxatives) unless told by your doctor. General instructions Watch your belly pain for any changes. Drink enough fluid to keep your pee (urine) pale yellow. Keep all follow-up visits as told by your doctor. This is important. Contact a doctor if: Your belly pain changes or gets worse. You are not hungry, or you lose weight without trying. You are having trouble pooping (constipated) or have watery poop (diarrhea) for more than 2-3 days. You have pain when you pee or poop. Your belly pain wakes you up at night. Your pain gets worse with meals, after eating, or with certain foods. You are vomiting and cannot keep anything down. You have a fever. You have blood in your pee. Get help right away if: Your pain does not go away as soon as your doctor says it should. You cannot stop vomiting. Your pain is only in areas of your belly, such as the right side or the left lower part of the belly. You have bloody or black poop, or poop that looks like tar. You have very bad pain, cramping, or bloating in your belly. You have signs of not having enough fluid or water in your body (dehydration), such as: Dark pee, very little pee, or no pee. Cracked lips. Dry mouth. Sunken eyes. Sleepiness. Weakness. You have trouble breathing or chest pain. Summary Many cases of belly pain can be watched and treated at home. Watch your belly pain for any changes. Take over-the-counter and prescription medicines only as told by your doctor. Contact a doctor if your belly pain changes or gets worse. Get help right  away if you have very bad pain, cramping, or bloating in your belly. This information is not intended to replace advice given to you by your health care provider. Make sure you discuss any questions you have with your health care provider. Document Revised: 07/12/2018 Document Reviewed: 07/12/2018 Elsevier Patient Education  [place].

## 2021-03-07 ENCOUNTER — Ambulatory Visit
Admission: RE | Admit: 2021-03-07 | Discharge: 2021-03-07 | Disposition: A | Discharge status: Home / Self Care | Payer: BC Managed Care – PPO | Source: Ambulatory Visit | Attending: Adult Health | Admitting: Adult Health

## 2021-03-07 DIAGNOSIS — R1033 Periumbilical pain: Secondary | ICD-10-CM | POA: Diagnosis not present

## 2021-03-07 DIAGNOSIS — R1031 Right lower quadrant pain: Secondary | ICD-10-CM | POA: Insufficient documentation

## 2021-03-07 DIAGNOSIS — R109 Unspecified abdominal pain: Secondary | ICD-10-CM | POA: Diagnosis not present

## 2021-03-07 DIAGNOSIS — K760 Fatty (change of) liver, not elsewhere classified: Secondary | ICD-10-CM | POA: Diagnosis not present

## 2021-03-07 DIAGNOSIS — R1011 Right upper quadrant pain: Secondary | ICD-10-CM | POA: Insufficient documentation

## 2021-03-07 DIAGNOSIS — R1012 Left upper quadrant pain: Secondary | ICD-10-CM | POA: Insufficient documentation

## 2021-03-07 HISTORY — DX: Essential (primary) hypertension: I10

## 2021-03-07 MED ORDER — IOHEXOL 300 MG/ML  SOLN
100.0000 mL | Freq: Once | INTRAMUSCULAR | Status: AC | PRN
  Administered 2021-03-07: 11:00:00 100 mL via INTRAVENOUS

## 2021-03-07 NOTE — Progress Notes (Signed)
CMP, glucose is mild elevated, was she fasting/? Otherwise CMP within normal limits.  Lipase, amylase, CBC, TSH all within normal limits.

## 2021-03-07 NOTE — Progress Notes (Signed)
Fatty liver on ultrasound, diet and exercise advised.  Small polyp inside gallbladder no specific follow up on this  but do want her to see Gastroenterologist as referred.

## 2021-03-07 NOTE — Progress Notes (Signed)
Fatty liver. No evidence of gallstones.  Small to moderate hiatal hernia.  No diverticulitis but evidence of diverticular changes.  Tiny hypodense area in both kidneys are too small to characterize but likely benign per radiology.  Mild plaque in aorta, good cholesterol control, diet and exercise advised.  No acute findings.  Keep gastroenterology appointment.  Follow up with Linda Haven, MD for primary health maintenance or persisting symptoms. Follow treatment plan from office  if not improving or any worsening within 72 hours and also return to office or open medical facility at ANYTIME if any symptoms persist, change, or worsen or you have any further concerns or questions. Call 911 immediately for emergencies.

## 2021-03-20 ENCOUNTER — Encounter: Payer: Self-pay | Admitting: Adult Health

## 2021-03-20 ENCOUNTER — Ambulatory Visit (INDEPENDENT_AMBULATORY_CARE_PROVIDER_SITE_OTHER): Payer: BC Managed Care – PPO | Admitting: Adult Health

## 2021-03-20 ENCOUNTER — Other Ambulatory Visit: Payer: Self-pay

## 2021-03-20 VITALS — BP 138/84 | HR 60 | Resp 18 | Ht 65.0 in | Wt 217.4 lb

## 2021-03-20 DIAGNOSIS — Z90710 Acquired absence of both cervix and uterus: Secondary | ICD-10-CM

## 2021-03-20 DIAGNOSIS — I700 Atherosclerosis of aorta: Secondary | ICD-10-CM | POA: Diagnosis not present

## 2021-03-20 DIAGNOSIS — R1012 Left upper quadrant pain: Secondary | ICD-10-CM

## 2021-03-20 DIAGNOSIS — R1033 Periumbilical pain: Secondary | ICD-10-CM

## 2021-03-20 DIAGNOSIS — R1011 Right upper quadrant pain: Secondary | ICD-10-CM | POA: Diagnosis not present

## 2021-03-20 NOTE — Patient Instructions (Signed)
Abdominal Pain, Adult Many things can cause belly (abdominal) pain. Most times, belly pain is not dangerous. Many cases of belly pain can be watched and treated at home. Sometimes, though, belly pain is serious. Yourdoctor will try to find the cause of your belly pain. Follow these instructions at home:  Medicines Take over-the-counter and prescription medicines only as told by your doctor. Do not take medicines that help you poop (laxatives) unless told by your doctor. General instructions Watch your belly pain for any changes. Drink enough fluid to keep your pee (urine) pale yellow. Keep all follow-up visits as told by your doctor. This is important. Contact a doctor if: Your belly pain changes or gets worse. You are not hungry, or you lose weight without trying. You are having trouble pooping (constipated) or have watery poop (diarrhea) for more than 2-3 days. You have pain when you pee or poop. Your belly pain wakes you up at night. Your pain gets worse with meals, after eating, or with certain foods. You are vomiting and cannot keep anything down. You have a fever. You have blood in your pee. Get help right away if: Your pain does not go away as soon as your doctor says it should. You cannot stop vomiting. Your pain is only in areas of your belly, such as the right side or the left lower part of the belly. You have bloody or black poop, or poop that looks like tar. You have very bad pain, cramping, or bloating in your belly. You have signs of not having enough fluid or water in your body (dehydration), such as: Dark pee, very little pee, or no pee. Cracked lips. Dry mouth. Sunken eyes. Sleepiness. Weakness. You have trouble breathing or chest pain. Summary Many cases of belly pain can be watched and treated at home. Watch your belly pain for any changes. Take over-the-counter and prescription medicines only as told by your doctor. Contact a doctor if your belly pain  changes or gets worse. Get help right away if you have very bad pain, cramping, or bloating in your belly. This information is not intended to replace advice given to you by your health care provider. Make sure you discuss any questions you have with your healthcare provider. Document Revised: 07/12/2018 Document Reviewed: 07/12/2018 Elsevier Patient Education  2022 Elsevier Inc.  

## 2021-03-20 NOTE — Progress Notes (Signed)
Acute Office Visit  Subjective:    Patient ID: Linda Henson, female    DOB: [birth year], 66 y.o.   MRN: [id number]  Chief Complaint  Patient presents with   Follow-up    HPI Patient is in today for follow up on RUQ pain, periumbilical and LUQ pain.  She had  CT scan done which showed no evidence of gallstones, tiny hypodense areas of kidneys both to small to characterize and likely benign per radiology. Showed mild aortosclerosis.  Labs were within normal limits glucose mild elevation was not fasting.  Preferred to gastroenterology and to follow up with PCP Caryl Bis Angela Adam, MD on other incidental likely benign changes.   Protonix 40mg  po qd- she has not been taking it daily. However I do recommend she take everyday.  She has not heard from [facility] gastroenterology however I do see referral is approved and she can call she is aware.  Recommended metamucil or fiber supplement per package after CT scan. She has started metamucil.  Denies any blood in bowel movements.    Patient  denies any fever, body aches,chills, rash, chest pain, shortness of breath, nausea, vomiting, or diarrhea.   Past Medical History:  Diagnosis Date   Arthritis    spine - mild   Colon polyp    Repeat colonoscopy 2018   COPD (chronic obstructive pulmonary disease) (HCC)    Diverticulitis    GERD (gastroesophageal reflux disease)    Resolved with wt loss   Hepatitis C 2018   treated with Harvoni   Hyperlipidemia    Borderline    Hypertension    Iron deficiency anemia 2014   Sleep apnea    CPAP    Past Surgical History:  Procedure Laterality Date   ABDOMINAL HYSTERECTOMY  1994   CATARACT EXTRACTION W/PHACO Right 08/30/2019   Procedure: CATARACT EXTRACTION PHACO AND INTRAOCULAR LENS PLACEMENT ([place]) RIGHT;  Surgeon: Birder Robson, MD;  Location: [facility];  Service: Ophthalmology;  Laterality: Right;  2.16 0:20.2   COLONOSCOPY WITH PROPOFOL N/A 02/05/2015   Procedure: COLONOSCOPY  WITH PROPOFOL;  Surgeon: Josefine Class, MD;  Location: [facility] ENDOSCOPY;  Service: Endoscopy;  Laterality: N/A;   EYE SURGERY     SALPINGOOPHORECTOMY Right 1994    Family History  Problem Relation Age of Onset   Heart disease Father 60       CAD - died of MI   Hyperlipidemia Father    Cancer Brother 40       stomach and esophageal cancer   Hypothyroidism Sister    Diabetes Paternal Grandmother    Breast cancer Neg Hx     Social History   Socioeconomic History   Marital status: Widowed    Spouse name: Not on file   Number of children: 1   Years of education: 14   Highest education level: Not on file  Occupational History   Occupation: Dedicated Hydrologist: [employer]    Comment: [employer]  Tobacco Use   Smoking status: Former    Years: 4.00    Types: Cigarettes    Quit date: 12/15/1992    Years since quitting: 28.2   Smokeless tobacco: Never  Vaping Use   Vaping Use: Never used  Substance and Sexual Activity   Alcohol use: Yes    Alcohol/week: 14.0 standard drinks    Types: 14 Standard drinks or equivalent per week   Drug use: No   Sexual activity: Not Currently    Birth  control/protection: Surgical  Other Topics Concern   Not on file  Social History Narrative   Datra grew up in [place], [place]. She is widowed for 5 years. She lives at home with her 66 year old mother. 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 She enjoys playing golf.    Social Determinants of Health   Financial Resource Strain: Not on file  Food Insecurity: Not on file  Transportation Needs: Not on file  Physical Activity: Not on file  Stress: Not on file  Social Connections: Not on file  Intimate Partner Violence: Not on file    Outpatient Medications Prior to Visit  Medication Sig Dispense Refill   amLODipine (NORVASC) 5 MG tablet Take 1 tablet (5 mg total) by mouth daily. 90 tablet 3   clobetasol ointment (TEMOVATE) 2.95 %  Apply 1 application topically 2 (two) times daily. Apply to aa's BID PRN flares. 30 g 1   pantoprazole (PROTONIX) 40 MG tablet TAKE 1 TABLET BY MOUTH DAILY 90 tablet 1   rosuvastatin (CRESTOR) 20 MG tablet Take 1 tablet (20 mg total) by mouth daily. 90 tablet 3   Semaglutide-Weight Management 0.25 MG/0.5ML SOAJ Inject 0.25 mg into the skin once a week for 28 days. 2 mL 0   [START ON 03/23/2021] Semaglutide-Weight Management 0.5 MG/0.5ML SOAJ Inject 0.5 mg into the skin once a week for 28 days. 2 mL 0   [START ON 04/21/2021] Semaglutide-Weight Management 1 MG/0.5ML SOAJ Inject 1 mg into the skin once a week for 28 days. 2 mL 0   [START ON 05/20/2021] Semaglutide-Weight Management 1.7 MG/0.75ML SOAJ Inject 1.7 mg into the skin once a week for 28 days. 3 mL 0   [START ON 06/18/2021] Semaglutide-Weight Management 2.4 MG/0.75ML SOAJ Inject 2.4 mg into the skin once a week for 28 days. 3 mL 0   valsartan (DIOVAN) 160 MG tablet Take 1 tablet (160 mg total) by mouth daily. 90 tablet 3   No facility-administered medications prior to visit.    No Known Allergies  Review of Systems  Constitutional: Negative.   HENT: Negative.    Respiratory: Negative.    Cardiovascular: Negative.  Negative for chest pain.  Gastrointestinal: Negative.   Genitourinary: Negative.   Musculoskeletal: Negative.   Skin: Negative.       Objective:    Physical Exam Vitals reviewed.  Constitutional:      General: She is not in acute distress.    Appearance: She is well-developed. She is not diaphoretic.     Interventions: She is not intubated. HENT:     Head: Normocephalic and atraumatic.     Right Ear: Tympanic membrane, ear canal and external ear normal. There is no impacted cerumen.     Left Ear: Tympanic membrane, ear canal and external ear normal. There is no impacted cerumen.     Nose: Nose normal. No congestion or rhinorrhea.     Mouth/Throat:     Mouth: Mucous membranes are moist.     Pharynx: Oropharynx is  clear. No oropharyngeal exudate.  Eyes:     General: Lids are normal. No scleral icterus.       Right eye: No discharge.        Left eye: No discharge.     Conjunctiva/sclera: Conjunctivae normal.     Right eye: Right conjunctiva is not injected. No exudate or hemorrhage.    Left eye: Left conjunctiva is not injected. No exudate or hemorrhage.  Pupils: Pupils are equal, round, and reactive to light.  Neck:     Thyroid: No thyroid mass or thyromegaly.     Vascular: Normal carotid pulses. No carotid bruit, hepatojugular reflux or JVD.     Trachea: Trachea and phonation normal. No tracheal tenderness or tracheal deviation.     Meningeal: Brudzinski's sign and Kernig's sign absent.  Cardiovascular:     Rate and Rhythm: Normal rate and regular rhythm.     Pulses: Normal pulses.          Radial pulses are 2+ on the right side and 2+ on the left side.       Dorsalis pedis pulses are 2+ on the right side and 2+ on the left side.       Posterior tibial pulses are 2+ on the right side and 2+ on the left side.     Heart sounds: Normal heart sounds, S1 normal and S2 normal. Heart sounds not distant. No murmur heard.   No friction rub. No gallop.  Pulmonary:     Effort: Pulmonary effort is normal. No tachypnea, bradypnea, accessory muscle usage or respiratory distress. She is not intubated.     Breath sounds: Normal breath sounds. No stridor. No wheezing, rhonchi or rales.  Chest:     Chest wall: No tenderness.  Abdominal:     General: Bowel sounds are normal. There is no distension or abdominal bruit.     Palpations: Abdomen is soft. There is no shifting dullness, fluid wave, hepatomegaly, splenomegaly, mass or pulsatile mass.     Tenderness: There is no abdominal tenderness. There is no right CVA tenderness, left CVA tenderness, guarding or rebound.     Hernia: No hernia is present.  Musculoskeletal:        General: No tenderness or deformity. Normal range of motion.     Cervical back: Full  passive range of motion without pain, normal range of motion and neck supple. No edema, erythema or rigidity. No spinous process tenderness or muscular tenderness. Normal range of motion.  Lymphadenopathy:     Head:     Right side of head: No submental, submandibular, tonsillar, preauricular, posterior auricular or occipital adenopathy.     Left side of head: No submental, submandibular, tonsillar, preauricular, posterior auricular or occipital adenopathy.     Cervical: No cervical adenopathy.     Right cervical: No superficial, deep or posterior cervical adenopathy.    Left cervical: No superficial, deep or posterior cervical adenopathy.     Upper Body:     Right upper body: No supraclavicular or pectoral adenopathy.     Left upper body: No supraclavicular or pectoral adenopathy.  Skin:    General: Skin is warm and dry.     Coloration: Skin is not jaundiced or pale.     Findings: No abrasion, bruising, burn, ecchymosis, erythema, lesion, petechiae or rash.     Nails: There is no clubbing.  Neurological:     Mental Status: She is alert and oriented to person, place, and time.     GCS: GCS eye subscore is 4. GCS verbal subscore is 5. GCS motor subscore is 6.     Cranial Nerves: No cranial nerve deficit.     Sensory: No sensory deficit.     Motor: No weakness, tremor, atrophy, abnormal muscle tone or seizure activity.     Coordination: Coordination normal.     Gait: Gait normal.     Deep Tendon Reflexes: Reflexes are normal and symmetric.  Reflexes normal. Babinski sign absent on the right side. Babinski sign absent on the left side.     Reflex Scores:      Tricep reflexes are 2+ on the right side and 2+ on the left side.      Bicep reflexes are 2+ on the right side and 2+ on the left side.      Brachioradialis reflexes are 2+ on the right side and 2+ on the left side.      Patellar reflexes are 2+ on the right side and 2+ on the left side.      Achilles reflexes are 2+ on the right side  and 2+ on the left side. Psychiatric:        Mood and Affect: Mood normal.        Speech: Speech normal.        Behavior: Behavior normal.        Thought Content: Thought content normal.        Judgment: Judgment normal.    BP 138/84    Pulse 60    Resp 18    Ht 5\' 5"  (1.651 m)    Wt 217 lb 6.4 oz (98.6 kg)    SpO2 94%    BMI 36.18 kg/m  Wt Readings from Last 3 Encounters:  03/20/21 217 lb 6.4 oz (98.6 kg)  03/06/21 213 lb 12.8 oz (97 kg)  02/14/21 212 lb (96.2 kg)    Health Maintenance Due  Topic Date Due   Pneumonia Vaccine 44+ Years old (1 - PCV) Never done   COVID-19 Vaccine (3 - Moderna risk series) 07/21/2019   COLONOSCOPY (Pts 45-79yrs Insurance coverage will need to be confirmed)  02/05/2020   DEXA SCAN  Never done    There are no preventive care reminders to display for this patient.   Lab Results  Component Value Date   TSH 1.59 03/06/2021   Lab Results  Component Value Date   WBC 8.0 03/06/2021   HGB 14.0 03/06/2021   HCT 41.5 03/06/2021   MCV 98.5 03/06/2021   PLT 159.0 03/06/2021   Lab Results  Component Value Date   NA 136 03/06/2021   K 4.3 03/06/2021   CO2 28 03/06/2021   GLUCOSE 119 (H) 03/06/2021   BUN 13 03/06/2021   CREATININE 0.85 03/06/2021   BILITOT 0.7 03/06/2021   ALKPHOS 46 03/06/2021   AST 19 03/06/2021   ALT 28 03/06/2021   PROT 7.0 03/06/2021   ALBUMIN 4.4 03/06/2021   CALCIUM 9.5 03/06/2021   GFR 71.64 03/06/2021   Lab Results  Component Value Date   CHOL 235 (H) 03/14/2020   Lab Results  Component Value Date   HDL 57.70 03/14/2020   Lab Results  Component Value Date   LDLCALC 139 (H) 08/24/2019   Lab Results  Component Value Date   TRIG 267.0 (H) 03/14/2020   Lab Results  Component Value Date   CHOLHDL 4 03/14/2020   Lab Results  Component Value Date   HGBA1C 6.1 12/05/2020       Assessment & Plan:   Problem List Items Addressed This Visit       Cardiovascular and Mediastinum   Aortic  atherosclerosis ([place])     Other   S/P hysterectomy   LUQ pain - Primary   Periumbilical abdominal pain   RUQ pain   Abdominal pain has now resolved. Still recommend GI consult.  Continue Crestor for plaque prevention.  She has not yet had Wegovy started  or approved and is waiting on prior authorization. Will send to [facility] since Leone Haven, MD is out to se if prior authorization awaiting.   Red Flags discussed. The patient was given clear instructions to go to ER or return to medical center if any red flags develop, symptoms do not improve, worsen or new problems develop. They verbalized understanding. Follow treatment plan from office  if not improving or any worsening within 72 hours and also return to office or open medical facility at ANYTIME if any symptoms persist, change, or worsen or you have any further concerns or questions. Call 911 immediately for emergencies.    No orders of the defined types were placed in this encounter. Call if not heard from [facility] prescription and once starts schedule a follow up for 1 month.  Return in about 3 months (around 06/18/2021), or if symptoms worsen or fail to improve, for at any time for any worsening symptoms, Go to Emergency room/ urgent care if worse.   Marcille Buffy, FNP

## 2021-03-22 ENCOUNTER — Other Ambulatory Visit: Payer: Self-pay

## 2021-03-22 ENCOUNTER — Ambulatory Visit
Admission: RE | Admit: 2021-03-22 | Discharge: 2021-03-22 | Disposition: A | Discharge status: Home / Self Care | Payer: BC Managed Care – PPO | Source: Ambulatory Visit | Attending: Family Medicine | Admitting: Family Medicine

## 2021-03-22 DIAGNOSIS — Z1231 Encounter for screening mammogram for malignant neoplasm of breast: Secondary | ICD-10-CM | POA: Diagnosis not present

## 2021-03-22 NOTE — Progress Notes (Signed)
Patient was denied by insurance for [facility] and I did an appeal and she was still denied, can you write for an alternative or something else.  Karyna Bessler,cma

## 2021-03-27 ENCOUNTER — Encounter: Payer: Self-pay | Admitting: Dermatology

## 2021-03-27 ENCOUNTER — Other Ambulatory Visit: Payer: Self-pay

## 2021-03-27 ENCOUNTER — Ambulatory Visit (INDEPENDENT_AMBULATORY_CARE_PROVIDER_SITE_OTHER): Payer: BC Managed Care – PPO | Admitting: Dermatology

## 2021-03-27 DIAGNOSIS — L900 Lichen sclerosus et atrophicus: Secondary | ICD-10-CM | POA: Diagnosis not present

## 2021-03-27 MED ORDER — CLOBETASOL PROPIONATE 0.05 % EX OINT: 1.0000 "application " | TOPICAL_OINTMENT | Freq: Two times a day (BID) | CUTANEOUS | 1 refills | Status: DC

## 2021-03-27 NOTE — Progress Notes (Signed)
° °  Follow-Up Visit   Subjective  Linda Henson is a 66 y.o. female who presents for the following: Follow-up (Patient here today for follow up on lichen sclerosus et atrophicus at vaginal area. Patient denies any pain or irritation. ).   The following portions of the chart were reviewed this encounter and updated as appropriate:  Tobacco   Allergies   Meds   Problems   Med Hx   Surg Hx   Fam Hx       Review of Systems: No other skin or systemic complaints except as noted in HPI or Assessment and Plan.   Objective  Well appearing patient in no apparent distress; mood and affect are within normal limits.  A focused examination was performed including genital, perianal. Relevant physical exam findings are noted in the Assessment and Plan.  vaginal area 1 very thin erosion at left labia minora just anterior to vagina    Assessment & Plan  Lichen sclerosus et atrophicus vaginal area  Chronic condition with duration over one year. Currently flared.   Continue clobetasol 0.05% ointment twice daily to affected red areas and erosion.  Use twice a day on weekends only for other Recommend applying with a mirror to erosion.   If erosion still present, may consider biopsy to r/o malignancy   Lichen sclerosus is a chronic inflammatory condition of unknown cause that frequently involves the vaginal area and less commonly extragenital skin, and is NOT sexually transmitted. It frequently causes symptoms of pain and burning.  It requires regular monitoring and treatment with topical steroids to minimize inflammation and to reduce risk of scarring. There is also a risk of cancer in the vaginal area which is very low if inflammation is well controlled. Regular checks of the area are recommended. Please call if you notice any new or changing spots within this area.  Related Medications clobetasol ointment (TEMOVATE) 1.63 % Apply 1 application topically 2 (two) times daily. Apply to aa's BID PRN  flares.   Return for 4 - 6 week follow up on LS et A.  I, Ruthell Rummage, CMA, am acting as scribe for Forest Gleason, MD.  Documentation: I have reviewed the above documentation for accuracy and completeness, and I agree with the above.  Forest Gleason, MD

## 2021-03-27 NOTE — Patient Instructions (Signed)

## 2021-03-28 DIAGNOSIS — G4733 Obstructive sleep apnea (adult) (pediatric): Secondary | ICD-10-CM | POA: Diagnosis not present

## 2021-04-12 DIAGNOSIS — D3131 Benign neoplasm of right choroid: Secondary | ICD-10-CM | POA: Diagnosis not present

## 2021-04-25 ENCOUNTER — Other Ambulatory Visit: Payer: Self-pay

## 2021-04-25 ENCOUNTER — Ambulatory Visit (INDEPENDENT_AMBULATORY_CARE_PROVIDER_SITE_OTHER): Payer: BC Managed Care – PPO | Admitting: Dermatology

## 2021-04-25 DIAGNOSIS — D485 Neoplasm of uncertain behavior of skin: Secondary | ICD-10-CM | POA: Diagnosis not present

## 2021-04-25 DIAGNOSIS — L900 Lichen sclerosus et atrophicus: Secondary | ICD-10-CM | POA: Diagnosis not present

## 2021-04-25 NOTE — Patient Instructions (Signed)

## 2021-04-25 NOTE — Progress Notes (Signed)
° °  Follow-Up Visit   Subjective  Linda Henson is a 66 y.o. female who presents for the following: Follow-up (Patient here today for 1 month LS et A follow up. Patient is currently using clobetasol ointment 1-2 times daily. Patient advises she is doing well, sometimes she has burning when applying ointment. ).  The following portions of the chart were reviewed this encounter and updated as appropriate:   Tobacco   Allergies   Meds   Problems   Med Hx   Surg Hx   Fam Hx       Review of Systems:  No other skin or systemic complaints except as noted in HPI or Assessment and Plan.  Objective  Well appearing patient in no apparent distress; mood and affect are within normal limits.  A focused examination was performed including vaginal area. Relevant physical exam findings are noted in the Assessment and Plan.  vaginal area 1 very thin erosion at left labia minora just anterior to vagina  left labia minora Thin erosion 1.3 cm R/o SCCis in setting of lichen sclerosus, non-healing erosion    Assessment & Plan  Lichen sclerosus et atrophicus vaginal area  Chronic and persistent condition with duration or expected duration over one year. Condition is bothersome/symptomatic for patient. Currently flared.  Not clearing despite clobetasol 1-2 times daily  Discussed treating with plaquenil if biopsy shows no precancerous/cancerous changes Continue clobetasol twice daily in meantime  Lichen sclerosus is a chronic inflammatory condition of unknown cause that frequently involves the vaginal area and less commonly extragenital skin, and is NOT sexually transmitted. It frequently causes symptoms of pain and burning.  It requires regular monitoring and treatment with topical steroids to minimize inflammation and to reduce risk of scarring. There is also a risk of cancer in the vaginal area which is very low if inflammation is well controlled. Regular checks of the area are recommended. Please  call if you notice any new or changing spots within this area.   Related Medications clobetasol ointment (TEMOVATE) 6.78 % Apply 1 application topically 2 (two) times daily. Apply to aa's BID PRN flares.  Neoplasm of uncertain behavior of skin left labia minora  Skin / nail biopsy Type of biopsy: tangential   Informed consent: discussed and consent obtained   Timeout: patient name, date of birth, surgical site, and procedure verified   Patient was prepped and draped in usual sterile fashion: Area prepped with isopropyl alcohol. Anesthesia: the lesion was anesthetized in a standard fashion   Anesthetic:  1% lidocaine w/ epinephrine 1-100,000 buffered w/ 8.4% NaHCO3 Instrument used: flexible razor blade   Hemostasis achieved with: aluminum chloride   Outcome: patient tolerated procedure well   Post-procedure details: wound care instructions given   Additional details:  Mupirocin and a bandage applied  Specimen 1 - Surgical pathology Differential Diagnosis: R/o SCCis in setting of lichen sclerosus, non-healing erosion  Check Margins: No Thin erosion     Return in about 3 months (around 07/23/2021) for LS et A with Dr. Nicole Kindred .  Graciella Belton, RMA, am acting as scribe for Forest Gleason, MD .  Documentation: I have reviewed the above documentation for accuracy and completeness, and I agree with the above.  Forest Gleason, MD

## 2021-04-29 ENCOUNTER — Ambulatory Visit (INDEPENDENT_AMBULATORY_CARE_PROVIDER_SITE_OTHER): Payer: BC Managed Care – PPO

## 2021-04-29 ENCOUNTER — Encounter: Payer: Self-pay | Admitting: *Deleted

## 2021-04-29 ENCOUNTER — Other Ambulatory Visit: Payer: Self-pay

## 2021-04-29 ENCOUNTER — Ambulatory Visit
Admission: EM | Admit: 2021-04-29 | Discharge: 2021-04-29 | Disposition: A | Discharge status: Home / Self Care | Payer: BC Managed Care – PPO | Attending: Internal Medicine | Admitting: Internal Medicine

## 2021-04-29 DIAGNOSIS — R0602 Shortness of breath: Secondary | ICD-10-CM

## 2021-04-29 DIAGNOSIS — J441 Chronic obstructive pulmonary disease with (acute) exacerbation: Secondary | ICD-10-CM

## 2021-04-29 DIAGNOSIS — J209 Acute bronchitis, unspecified: Secondary | ICD-10-CM

## 2021-04-29 DIAGNOSIS — R059 Cough, unspecified: Secondary | ICD-10-CM | POA: Diagnosis not present

## 2021-04-29 LAB — POCT INFLUENZA A/B
Influenza A, POC: NEGATIVE
Influenza B, POC: NEGATIVE

## 2021-04-29 MED ORDER — ALBUTEROL SULFATE (2.5 MG/3ML) 0.083% IN NEBU
2.5000 mg | INHALATION_SOLUTION | Freq: Once | RESPIRATORY_TRACT | Status: AC
  Administered 2021-04-29: 2.5 mg via RESPIRATORY_TRACT

## 2021-04-29 MED ORDER — ALBUTEROL SULFATE HFA 108 (90 BASE) MCG/ACT IN AERS: 2.0000 | INHALATION_SPRAY | RESPIRATORY_TRACT | 0 refills | Status: DC | PRN

## 2021-04-29 MED ORDER — GUAIFENESIN-CODEINE 100-10 MG/5ML PO SOLN: 5.0000 mL | Freq: Three times a day (TID) | ORAL | 0 refills | Status: DC | PRN

## 2021-04-29 MED ORDER — PREDNISONE 10 MG (21) PO TBPK: ORAL_TABLET | Freq: Every day | ORAL | 0 refills | Status: DC

## 2021-04-29 MED ORDER — AZITHROMYCIN 250 MG PO TABS: ORAL_TABLET | ORAL | 0 refills | Status: DC

## 2021-04-29 NOTE — Discharge Instructions (Addendum)
Please follow up with your primary care doctor in one week, you may need to have the chest xray repeated to make sure is back to normal.

## 2021-04-29 NOTE — ED Triage Notes (Signed)
Pt reports dry cough ,sneezing ,sore throat,HA and SHOB started Friday. Pt reports she slept all weekend .

## 2021-04-29 NOTE — ED Provider Notes (Signed)
Roderic Palau    CSN: [id number] Arrival date & time: 04/29/21  1005      History   Chief Complaint Chief Complaint  Patient presents with   Cough   COPD   Nasal Congestion   Shortness of Breath    HPI LOUKISHA GUNNERSON is a 66 y.o. female ST Wed. Friday sneezed a lot and took Benadryl which helped a little. Has been fatigued all weekend. Has been taking Mucinex D. Onset of cough x 3 days. Cough is non productive. Had a fever of 101 yesterday. Has had HA and body aches. She is prone to getting bronchitis. Had 3 home covid test which were negative. Her normal pulse ox is 95% due to her COPD. She does not have any inhalers at home.     Past Medical History:  Diagnosis Date   Arthritis    spine - mild   Colon polyp    Repeat colonoscopy 2018   COPD (chronic obstructive pulmonary disease) (HCC)    Diverticulitis    GERD (gastroesophageal reflux disease)    Resolved with wt loss   Hepatitis C 2018   treated with Harvoni   Hyperlipidemia    Borderline    Hypertension    Iron deficiency anemia 2014   Sleep apnea    CPAP    Patient Active Problem List   Diagnosis Date Noted   Hepatic steatosis 03/47/4259   Periumbilical abdominal pain 03/06/2021   RLQ abdominal pain 03/06/2021   RUQ pain 03/06/2021   It band syndrome, right 02/14/2021   Chronic dyspnea 12/05/2020   Aortic atherosclerosis ([place]) 12/05/2020   Hypertension 05/30/2020   Pain and swelling of left lower leg 05/30/2020   Emphysema lung ([place]) 03/28/2019   Urinary frequency 56/38/7564   Lichen sclerosus et atrophicus 03/05/2018   Anal skin tag 02/18/2018   Internal hemorrhoid 02/18/2018   Rib pain on right side 01/29/2018   Labial lesion 01/29/2018   SI (sacroiliac) joint dysfunction 01/29/2018   Sleeping difficulty 07/24/2017   Mixed incontinence urge and stress 07/24/2017   Positional numbness 01/15/2017   Depression, major, single episode, mild ([place]) 01/15/2017   Personal history of tobacco  use, presenting hazards to health 09/03/2016   Hyperlipidemia 01/21/2016   External hemorrhoids 02/14/2015   OSA on CPAP 11/12/2014   GERD (gastroesophageal reflux disease) 11/12/2014   LUQ pain 11/12/2014   Obesity 02/23/2014   S/P hysterectomy 02/22/2014   S/P LASIK surgery of both eyes 02/22/2014    Past Surgical History:  Procedure Laterality Date   ABDOMINAL HYSTERECTOMY  1994   CATARACT EXTRACTION W/PHACO Right 08/30/2019   Procedure: CATARACT EXTRACTION PHACO AND INTRAOCULAR LENS PLACEMENT ([place]) RIGHT;  Surgeon: Birder Robson, MD;  Location: [facility];  Service: Ophthalmology;  Laterality: Right;  2.16 0:20.2   COLONOSCOPY WITH PROPOFOL N/A 02/05/2015   Procedure: COLONOSCOPY WITH PROPOFOL;  Surgeon: Josefine Class, MD;  Location: [facility] ENDOSCOPY;  Service: Endoscopy;  Laterality: N/A;   EYE SURGERY     SALPINGOOPHORECTOMY Right 1994    OB History     Gravida  1   Para  1   Term  1   Preterm      AB      Living  1      SAB      IAB      Ectopic      Multiple      Live Births  1  Home Medications    Prior to Admission medications   Medication Sig Start Date End Date Taking? Authorizing Provider  albuterol (VENTOLIN HFA) 108 (90 Base) MCG/ACT inhaler Inhale 2 puffs into the lungs every 4 (four) hours as needed for wheezing or shortness of breath. 04/29/21  Yes Rodriguez-Southworth, Sunday Spillers, PA-C  azithromycin (ZITHROMAX Z-PAK) 250 MG tablet 2 today then one qd x 4 days 04/29/21  Yes Rodriguez-Southworth, Sunday Spillers, PA-C  guaiFENesin-codeine 100-10 MG/5ML syrup Take 5 mLs by mouth 3 (three) times daily as needed for cough. 04/29/21  Yes Rodriguez-Southworth, Sunday Spillers, PA-C  predniSONE (STERAPRED UNI-PAK 21 TAB) 10 MG (21) TBPK tablet Take by mouth daily. Take 6 tabs by mouth daily  for 2 days, then 5 tabs for 2 days, then 4 tabs for 2 days, then 3 tabs for 2 days, 2 tabs for 2 days, then 1 tab by mouth daily for 2 days 04/29/21  Yes  Rodriguez-Southworth, Sunday Spillers, PA-C  clobetasol ointment (TEMOVATE) 9.03 % Apply 1 application topically 2 (two) times daily. Apply to aa's BID PRN flares. 03/27/21   Moye, [place], MD  pantoprazole (PROTONIX) 40 MG tablet TAKE 1 TABLET BY MOUTH DAILY 12/18/20   Leone Haven, MD  rosuvastatin (CRESTOR) 20 MG tablet Take 1 tablet (20 mg total) by mouth daily. 03/25/20   Leone Haven, MD  Semaglutide-Weight Management 1 MG/0.5ML SOAJ Inject 1 mg into the skin once a week for 28 days. Patient not taking: Reported on 03/27/2021 04/21/21 05/19/21  Leone Haven, MD  Semaglutide-Weight Management 1.7 MG/0.75ML SOAJ Inject 1.7 mg into the skin once a week for 28 days. Patient not taking: Reported on 03/27/2021 05/20/21 06/17/21  Leone Haven, MD  Semaglutide-Weight Management 2.4 MG/0.75ML SOAJ Inject 2.4 mg into the skin once a week for 28 days. Patient not taking: Reported on 03/27/2021 06/18/21 07/16/21  Leone Haven, MD  valsartan (DIOVAN) 160 MG tablet Take 1 tablet (160 mg total) by mouth daily. 12/15/20   Leone Haven, MD    Family History Family History  Problem Relation Age of Onset   Heart disease Father 20       CAD - died of MI   Hyperlipidemia Father    Cancer Brother 30       stomach and esophageal cancer   Hypothyroidism Sister    Diabetes Paternal Grandmother    Breast cancer Neg Hx     Social History Social History   Tobacco Use   Smoking status: Former    Years: 4.00    Types: Cigarettes    Quit date: 12/15/1992    Years since quitting: 28.3   Smokeless tobacco: Never  Vaping Use   Vaping Use: Never used  Substance Use Topics   Alcohol use: Yes    Alcohol/week: 14.0 standard drinks    Types: 14 Standard drinks or equivalent per week   Drug use: No     Allergies   Patient has no known allergies.   Review of Systems Review of Systems  Constitutional:  Positive for chills and fever.  HENT:  Positive for postnasal drip. Negative for  congestion, ear discharge, ear pain, rhinorrhea and sore throat.   Respiratory:  Positive for cough and wheezing. Negative for shortness of breath.   Musculoskeletal:  Positive for myalgias.  Neurological:  Positive for headaches.    Physical Exam Triage Vital Signs ED Triage Vitals  Enc Vitals Group     BP 04/29/21 1057 (!) 145/94     Pulse  Rate 04/29/21 1057 74     Resp 04/29/21 1057 20     Temp 04/29/21 1057 98.2 F (36.8 C)     Temp src --      SpO2 04/29/21 1057 93 %     Weight --      Height --      Head Circumference --      Peak Flow --      Pain Score 04/29/21 1058 0     Pain Loc --      Pain Edu? --      Excl. in [place]? --    No data found.  Updated Vital Signs BP (!) 145/94    Pulse 74    Temp 98.2 F (36.8 C)    Resp 20    SpO2 97%   Visual Acuity Right Eye Distance:   Left Eye Distance:   Bilateral Distance:    Right Eye Near:   Left Eye Near:    Bilateral Near:      Physical Exam Constitutional:      General: He is not in acute distress.    Appearance: He is not toxic-appearing.  HENT:     Head: Normocephalic.     Right Ear: Tympanic membrane, ear canal and external ear normal.     Left Ear: Ear canal and external ear normal.     Nose: Nose normal.     Mouth/Throat:     Mouth: Mucous membranes are moist.     Pharynx: Oropharynx is clear.  Eyes:     General: No scleral icterus.    Conjunctiva/sclera: Conjunctivae normal.  Cardiovascular:     Rate and Rhythm: Normal rate and regular rhythm.     Heart sounds: No murmur heard.   Pulmonary:     Effort: Pulmonary effort is normal. No respiratory distress.     Breath sounds: Wheezing present.     Comments: Has auditory wheezing Musculoskeletal:        General: Normal range of motion.     Cervical back: Neck supple.  Lymphadenopathy:     Cervical: No cervical adenopathy.  Skin:    General: Skin is warm and dry.     Findings: No rash.  Neurological:     Mental Status: He is alert and  oriented to person, place, and time.     Gait: Gait normal.  Psychiatric:        Mood and Affect: Mood normal.        Behavior: Behavior normal.        Thought Content: Thought content normal.        Judgment: Judgment normal.    UC Treatments / Results  Labs (all labs ordered are listed, but only abnormal results are displayed) Labs Reviewed  POCT INFLUENZA A/B  Flu A&B negative  EKG   Radiology DG Chest 2 View  Result Date: 04/29/2021 CLINICAL DATA:  Cough and shortness of breath EXAM: CHEST - 2 VIEW COMPARISON:  Radiograph 03/31/2017 FINDINGS: Unchanged cardiomediastinal silhouette. There is no focal airspace consolidation. There is mild peripheral interstitial lung opacities as seen on prior chest CT. No large pleural effusion. No visible pneumothorax. Thoracic spondylosis. No acute osseous abnormality. IMPRESSION: Mild peripheral predominant interstitial opacities, could be sequela of chronic mild interstitial lung disease and/or mild edema. No focal airspace consolidation. Electronically Signed   By: Maurine Simmering M.D.   On: 04/29/2021 12:19    Procedures Procedures (including critical care time)  Medications Ordered in UC Medications  albuterol (PROVENTIL) (2.5 MG/3ML) 0.083% nebulizer solution 2.5 mg (2.5 mg Nebulization Given 04/29/21 1134)    Initial Impression / Assessment and Plan / UC Course  I have reviewed the triage vital signs and the nursing notes. Pertinent labs & imaging results that were available during my care of the patient were reviewed by me and considered in my medical decision making (see chart for details). Pt was given albuterol neb and her ocugh attacks resolved and pulse ox went up to 97% COPD exacerbation and acute bronchitis I placed her on Prednisone, Zpack, and Robitusin AC as noted Advised to FU with PCP about her chest xray.     Final Clinical Impressions(s) / UC Diagnoses   Final diagnoses:  COPD exacerbation ([place])  Acute bronchitis,  unspecified organism     Discharge Instructions      Please follow up with your primary care doctor in one week, you may need to have the chest xray repeated to make sure is back to normal.      ED Prescriptions     Medication Sig Dispense Auth. Provider   predniSONE (STERAPRED UNI-PAK 21 TAB) 10 MG (21) TBPK tablet Take by mouth daily. Take 6 tabs by mouth daily  for 2 days, then 5 tabs for 2 days, then 4 tabs for 2 days, then 3 tabs for 2 days, 2 tabs for 2 days, then 1 tab by mouth daily for 2 days 42 tablet Rodriguez-Southworth, Sunday Spillers, PA-C   azithromycin (ZITHROMAX Z-PAK) 250 MG tablet 2 today then one qd x 4 days 6 tablet Rodriguez-Southworth, Sunday Spillers, PA-C   albuterol (VENTOLIN HFA) 108 (90 Base) MCG/ACT inhaler Inhale 2 puffs into the lungs every 4 (four) hours as needed for wheezing or shortness of breath. 18 g Rodriguez-Southworth, Roshelle Traub, PA-C   guaiFENesin-codeine 100-10 MG/5ML syrup Take 5 mLs by mouth 3 (three) times daily as needed for cough. 120 mL Rodriguez-Southworth, Sunday Spillers, PA-C      I have reviewed the PDMP during this encounter.   Shelby Mattocks, PA-C 04/29/21 1231

## 2021-04-30 ENCOUNTER — Telehealth: Payer: BC Managed Care – PPO | Admitting: Adult Health

## 2021-05-01 ENCOUNTER — Telehealth: Payer: Self-pay

## 2021-05-01 ENCOUNTER — Other Ambulatory Visit: Payer: Self-pay | Admitting: Family Medicine

## 2021-05-01 DIAGNOSIS — E785 Hyperlipidemia, unspecified: Secondary | ICD-10-CM

## 2021-05-01 DIAGNOSIS — L900 Lichen sclerosus et atrophicus: Secondary | ICD-10-CM

## 2021-05-01 NOTE — Telephone Encounter (Addendum)
Patient advised bx results showed no evidence of precancer or cancerous changes per below -   Recommend getting baseline eye exam (retinal exam) at [facility] and starting hydroxychloroquine pill (400 mg daily) since she has persistent inflammation and erosion despite regular clobetasol use as long as eye exam is normal. I already reviewed her EKG (no long QT) and bloodwork (CMP and CBC with differential). This should not be used together with the Azithromycin antibiotic (please confirm she is no longer taking this)   Hydroxychloroquine (plaquenil) can rarely cause irreversible damage to the retina of the eye if used for a long period of time. This damage can be identified and the medication stopped before vision changes are noticed by going for a yearly ophthalmology exam. Rarely, this medicine can cause low blood counts, liver inflammation, and/or a color change of the skin.  The use of Plaquenil requires long term medication management, including periodic office visits and monitoring of blood work.  Referral sent to [facility].  Lurlean Horns., RMA

## 2021-05-01 NOTE — Telephone Encounter (Signed)
-----   Message from Alfonso Patten, MD sent at 05/01/2021  5:14 PM EST ----- Skin , left labia minora LICHEN SCLEROSUS with no evidence of precancerous or cancerous changes.  Recommend getting baseline eye exam (retinal exam) at [facility] and starting hydroxychloroquine pill (400 mg daily) since she has persistent inflammation and erosion despite regular clobetasol use as long as eye exam is normal. I already reviewed her EKG (no long QT) and bloodwork (CMP and CBC with differential). This should not be used together with the Azithromycin antibiotic (please confirm she is no longer taking this)  Hydroxychloroquine (plaquenil) can rarely cause irreversible damage to the retina of the eye if used for a long period of time. This damage can be identified and the medication stopped before vision changes are noticed by going for a yearly ophthalmology exam. Rarely, this medicine can cause low blood counts, liver inflammation, and/or a color change of the skin.  The use of Plaquenil requires long term medication management, including periodic office visits and monitoring of blood work.  MAs please call. Thank you!  CC Dr. Caryl Bis

## 2021-05-02 ENCOUNTER — Encounter: Payer: Self-pay | Admitting: Dermatology

## 2021-05-08 ENCOUNTER — Encounter: Payer: Self-pay | Admitting: Adult Health

## 2021-05-09 ENCOUNTER — Encounter: Payer: Self-pay | Admitting: Emergency Medicine

## 2021-05-09 ENCOUNTER — Ambulatory Visit
Admission: EM | Admit: 2021-05-09 | Discharge: 2021-05-09 | Disposition: A | Discharge status: Home / Self Care | Payer: BC Managed Care – PPO | Attending: Emergency Medicine | Admitting: Emergency Medicine

## 2021-05-09 ENCOUNTER — Ambulatory Visit: Payer: BC Managed Care – PPO | Admitting: Adult Health

## 2021-05-09 ENCOUNTER — Other Ambulatory Visit: Payer: Self-pay

## 2021-05-09 DIAGNOSIS — N3001 Acute cystitis with hematuria: Secondary | ICD-10-CM | POA: Diagnosis not present

## 2021-05-09 DIAGNOSIS — R300 Dysuria: Secondary | ICD-10-CM | POA: Diagnosis not present

## 2021-05-09 LAB — POCT URINALYSIS DIP (MANUAL ENTRY)
Glucose, UA: NEGATIVE mg/dL
Nitrite, UA: POSITIVE — AB
Protein Ur, POC: 100 mg/dL — AB
Spec Grav, UA: >=1.03 — AB (ref 1.010–1.025)
Urobilinogen, UA: 1 E.U./dL
pH, UA: 5.5 (ref 5.0–8.0)

## 2021-05-09 MED ORDER — CEFTRIAXONE SODIUM 1 G IJ SOLR
1.0000 g | Freq: Once | INTRAMUSCULAR | Status: AC
  Administered 2021-05-09: 1 g via INTRAMUSCULAR

## 2021-05-09 MED ORDER — CEPHALEXIN 500 MG PO CAPS: 500.0000 mg | ORAL_CAPSULE | Freq: Four times a day (QID) | ORAL | 0 refills | Status: AC

## 2021-05-09 NOTE — ED Provider Notes (Signed)
[facility]  ____________________________________________  Time seen: Approximately 8:24 AM  I have reviewed the triage vital signs and the nursing notes.   HISTORY  Chief Complaint Dysuria and Abdominal Pain   Historian Patient     HPI Linda Henson is a 66 y.o. female presents to the urgent care with dysuria and increased urinary frequency for the past 3 days.  Patient states that she did have a vaginal biopsy last week and has recently been on prednisone.  She denies abdominal pain or flank pain.  She has had some hematuria.  No prior history of pyelonephritis or nephrolithiasis.   Past Medical History:  Diagnosis Date   Arthritis    spine - mild   Colon polyp    Repeat colonoscopy 2018   COPD (chronic obstructive pulmonary disease) (HCC)    Diverticulitis    GERD (gastroesophageal reflux disease)    Resolved with wt loss   Hepatitis C 2018   treated with Harvoni   Hyperlipidemia    Borderline    Hypertension    Iron deficiency anemia 2014   Sleep apnea    CPAP     Immunizations up to date:  Yes.     Past Medical History:  Diagnosis Date   Arthritis    spine - mild   Colon polyp    Repeat colonoscopy 2018   COPD (chronic obstructive pulmonary disease) (HCC)    Diverticulitis    GERD (gastroesophageal reflux disease)    Resolved with wt loss   Hepatitis C 2018   treated with Harvoni   Hyperlipidemia    Borderline    Hypertension    Iron deficiency anemia 2014   Sleep apnea    CPAP    Patient Active Problem List   Diagnosis Date Noted   Hepatic steatosis 93/26/7124   Periumbilical abdominal pain 03/06/2021   RLQ abdominal pain 03/06/2021   RUQ pain 03/06/2021   It band syndrome, right 02/14/2021   Chronic dyspnea 12/05/2020   Aortic atherosclerosis ([place]) 12/05/2020   Hypertension 05/30/2020   Pain and swelling of left lower leg 05/30/2020   Emphysema lung ([place]) 03/28/2019   Urinary frequency 58/11/9831   Lichen sclerosus  et atrophicus 03/05/2018   Anal skin tag 02/18/2018   Internal hemorrhoid 02/18/2018   Rib pain on right side 01/29/2018   Labial lesion 01/29/2018   SI (sacroiliac) joint dysfunction 01/29/2018   Sleeping difficulty 07/24/2017   Mixed incontinence urge and stress 07/24/2017   Positional numbness 01/15/2017   Depression, major, single episode, mild ([place]) 01/15/2017   Personal history of tobacco use, presenting hazards to health 09/03/2016   Hyperlipidemia 01/21/2016   External hemorrhoids 02/14/2015   OSA on CPAP 11/12/2014   GERD (gastroesophageal reflux disease) 11/12/2014   LUQ pain 11/12/2014   Obesity 02/23/2014   S/P hysterectomy 02/22/2014   S/P LASIK surgery of both eyes 02/22/2014    Past Surgical History:  Procedure Laterality Date   ABDOMINAL HYSTERECTOMY  1994   CATARACT EXTRACTION W/PHACO Right 08/30/2019   Procedure: CATARACT EXTRACTION PHACO AND INTRAOCULAR LENS PLACEMENT ([place]) RIGHT;  Surgeon: Birder Robson, MD;  Location: [place];  Service: Ophthalmology;  Laterality: Right;  2.16 0:20.2   COLONOSCOPY WITH PROPOFOL N/A 02/05/2015   Procedure: COLONOSCOPY WITH PROPOFOL;  Surgeon: Josefine Class, MD;  Location: [facility] ENDOSCOPY;  Service: Endoscopy;  Laterality: N/A;   EYE SURGERY     SALPINGOOPHORECTOMY Right 1994    Prior to Admission medications   Medication  Sig Start Date End Date Taking? Authorizing Provider  cephALEXin (KEFLEX) 500 MG capsule Take 1 capsule (500 mg total) by mouth 4 (four) times daily for 7 days. 05/09/21 05/16/21 Yes Vallarie Mare M, PA-C  albuterol (VENTOLIN HFA) 108 (90 Base) MCG/ACT inhaler Inhale 2 puffs into the lungs every 4 (four) hours as needed for wheezing or shortness of breath. 04/29/21   Rodriguez-Southworth, Sunday Spillers, PA-C  azithromycin (ZITHROMAX Z-PAK) 250 MG tablet 2 today then one qd x 4 days 04/29/21   Rodriguez-Southworth, Sunday Spillers, PA-C  clobetasol ointment (TEMOVATE) 1.19 % Apply 1 application topically 2  (two) times daily. Apply to aa's BID PRN flares. 03/27/21   Moye, [place], MD  guaiFENesin-codeine 100-10 MG/5ML syrup Take 5 mLs by mouth 3 (three) times daily as needed for cough. 04/29/21   Rodriguez-Southworth, Sunday Spillers, PA-C  pantoprazole (PROTONIX) 40 MG tablet TAKE 1 TABLET BY MOUTH DAILY 12/18/20   Leone Haven, MD  predniSONE (STERAPRED UNI-PAK 21 TAB) 10 MG (21) TBPK tablet Take by mouth daily. Take 6 tabs by mouth daily  for 2 days, then 5 tabs for 2 days, then 4 tabs for 2 days, then 3 tabs for 2 days, 2 tabs for 2 days, then 1 tab by mouth daily for 2 days 04/29/21   Rodriguez-Southworth, Sunday Spillers, PA-C  rosuvastatin (CRESTOR) 20 MG tablet TAKE 1 TABLET(20 MG) BY MOUTH DAILY 05/02/21   Flinchum, Kelby Aline, FNP  Semaglutide-Weight Management 1 MG/0.5ML SOAJ Inject 1 mg into the skin once a week for 28 days. Patient not taking: Reported on 03/27/2021 04/21/21 05/19/21  Leone Haven, MD  Semaglutide-Weight Management 1.7 MG/0.75ML SOAJ Inject 1.7 mg into the skin once a week for 28 days. Patient not taking: Reported on 03/27/2021 05/20/21 06/17/21  Leone Haven, MD  Semaglutide-Weight Management 2.4 MG/0.75ML SOAJ Inject 2.4 mg into the skin once a week for 28 days. Patient not taking: Reported on 03/27/2021 06/18/21 07/16/21  Leone Haven, MD  valsartan (DIOVAN) 160 MG tablet Take 1 tablet (160 mg total) by mouth daily. 12/15/20   Leone Haven, MD    Allergies Patient has no known allergies.  Family History  Problem Relation Age of Onset   Heart disease Father 71       CAD - died of MI   Hyperlipidemia Father    Cancer Brother 70       stomach and esophageal cancer   Hypothyroidism Sister    Diabetes Paternal Grandmother    Breast cancer Neg Hx     Social History Social History   Tobacco Use   Smoking status: Former    Years: 4.00    Types: Cigarettes    Quit date: 12/15/1992    Years since quitting: 28.4   Smokeless tobacco: Never  Vaping Use   Vaping Use:  Never used  Substance Use Topics   Alcohol use: Yes    Alcohol/week: 14.0 standard drinks    Types: 14 Standard drinks or equivalent per week   Drug use: No     Review of Systems  Constitutional: No fever/chills Eyes:  No discharge ENT: No upper respiratory complaints. Respiratory: no cough. No SOB/ use of accessory muscles to breath Gastrointestinal:   No nausea, no vomiting.  No diarrhea.  No constipation. Genitourinary: Patient has dysuria.  Musculoskeletal: Negative for musculoskeletal pain. Skin: Negative for rash, abrasions, lacerations, ecchymosis.   ____________________________________________   PHYSICAL EXAM:  VITAL SIGNS: ED Triage Vitals  Enc Vitals Group     BP  05/09/21 0820 (!) 142/78     Pulse Rate 05/09/21 0820 89     Resp 05/09/21 0820 20     Temp 05/09/21 0820 98.9 F (37.2 C)     Temp src --      SpO2 05/09/21 0820 98 %     Weight --      Height --      Head Circumference --      Peak Flow --      Pain Score 05/09/21 0822 5     Pain Loc --      Pain Edu? --      Excl. in [place]? --      Constitutional: Alert and oriented. Well appearing and in no acute distress. Eyes: Conjunctivae are normal. PERRL. EOMI. Head: Atraumatic. ENT:      Nose: No congestion/rhinnorhea.      Mouth/Throat: Mucous membranes are moist.  Neck: No stridor.  No cervical spine tenderness to palpation. Cardiovascular: Normal rate, regular rhythm. Normal S1 and S2.  Good peripheral circulation. Respiratory: Normal respiratory effort without tachypnea or retractions. Lungs CTAB. Good air entry to the bases with no decreased or absent breath sounds Gastrointestinal: Bowel sounds x 4 quadrants. Soft and nontender to palpation. No guarding or rigidity. No distention.  No CVA tenderness. Musculoskeletal: Full range of motion to all extremities. No obvious deformities noted Neurologic:  Normal for age. No gross focal neurologic deficits are appreciated.  Skin:  Skin is warm, dry and  intact. No rash noted. Psychiatric: Mood and affect are normal for age. Speech and behavior are normal.   ____________________________________________   LABS (all labs ordered are listed, but only abnormal results are displayed)  Labs Reviewed  POCT URINALYSIS DIP (MANUAL ENTRY) - Abnormal; Notable for the following components:      Result Value   Clarity, UA cloudy (*)    Bilirubin, UA small (*)    Ketones, POC UA trace (5) (*)    Spec Grav, UA >=1.030 (*)    Blood, UA large (*)    Protein Ur, POC =100 (*)    Nitrite, UA Positive (*)    Leukocytes, UA Trace (*)    All other components within normal limits  URINE CULTURE   ____________________________________________  EKG   ____________________________________________  RADIOLOGY   No results found.  ____________________________________________    PROCEDURES  Procedure(s) performed:     Procedures     Medications  cefTRIAXone (ROCEPHIN) injection 1 g (1 g Intramuscular Given 05/09/21 0851)     ____________________________________________   INITIAL IMPRESSION / ASSESSMENT AND PLAN / ED COURSE  Pertinent labs & imaging results that were available during my care of the patient were reviewed by me and considered in my medical decision making (see chart for details).      Assessment and plan Dysuria Increased urinary frequency 66 year old female presents to the emergency department with dysuria and increased urinary urgency for the past 3 days.  Vital signs are reassuring at triage.  On physical exam, patient was alert, active and nontoxic-appearing with no increased work of breathing.  No CVA tenderness on exam.  Urinalysis concerning for UTI.  Urine culture pending.  Patient was given an injection of Rocephin and was discharged with Keflex.  Patient was cautioned that should she develop worsening flank pain, nausea, vomiting or fever, she should seek care at local emergency department.  All patient  questions were answered.   ____________________________________________  FINAL CLINICAL IMPRESSION(S) / ED DIAGNOSES  Final diagnoses:  Acute cystitis with hematuria  Dysuria      NEW MEDICATIONS STARTED DURING THIS VISIT:  ED Discharge Orders          Ordered    cephALEXin (KEFLEX) 500 MG capsule  4 times daily        05/09/21 0836                This chart was dictated using voice recognition software/Dragon. Despite best efforts to proofread, errors can occur which can change the meaning. Any change was purely unintentional.     Lannie Fields, PA-C 05/09/21 0901

## 2021-05-09 NOTE — Discharge Instructions (Signed)
Take Keflex four times daily for the next seven days.  

## 2021-05-09 NOTE — ED Triage Notes (Signed)
Pt here with possible hematuria, burning while urinating, and supra pubic pressure and pain x 3 days. Pt had a vaginal biopsy a week prior and had some lingering pain from that as well.

## 2021-05-11 LAB — URINE CULTURE: Culture: >=100000 — AB

## 2021-05-15 ENCOUNTER — Telehealth (INDEPENDENT_AMBULATORY_CARE_PROVIDER_SITE_OTHER): Payer: BC Managed Care – PPO | Admitting: Family Medicine

## 2021-05-15 ENCOUNTER — Encounter: Payer: Self-pay | Admitting: Family Medicine

## 2021-05-15 DIAGNOSIS — N3001 Acute cystitis with hematuria: Secondary | ICD-10-CM | POA: Diagnosis not present

## 2021-05-15 DIAGNOSIS — J40 Bronchitis, not specified as acute or chronic: Secondary | ICD-10-CM | POA: Diagnosis not present

## 2021-05-15 DIAGNOSIS — N390 Urinary tract infection, site not specified: Secondary | ICD-10-CM | POA: Insufficient documentation

## 2021-05-15 NOTE — Progress Notes (Addendum)
? ?Virtual Visit via video Note ? ?This visit type was conducted due to national recommendations for restrictions regarding the COVID-19 pandemic (e.g. social distancing).  This format is felt to be most appropriate for this patient at this time.  All issues noted in this document were discussed and addressed.  No physical exam was performed (except for noted visual exam findings with Video Visits).  ? ?I connected with Linda Henson today at  1:45 PM EST by a video enabled telemedicine application and verified that I am speaking with the correct person using two identifiers. ?Location patient: Hotel in [place] ?Location provider: Work ?Persons participating in the virtual visit: patient, provider ? ?I discussed the limitations, risks, security and privacy concerns of performing an evaluation and management service by telephone and the availability of in person appointments. I also discussed with the patient that there may be a patient responsible charge related to this service. The patient expressed understanding and agreed to proceed. ? ?Reason for visit: f/u ? ?HPI: ?Bronchitis: Patient was treated at urgent care for bronchitis recently.  She was treated with prednisone and a Z-Pak.  She has recovered back to her baseline.  Cough has been improving.  She notes no wheezing or shortness of breath. ? ?UTI: Patient was diagnosed with a UTI little over a week ago.  She notes her symptoms have improved on Keflex.  She notes no urinary frequency or dysuria.  She did have hematuria with this.  She does report her stomach feels a little tight though has no pain.  Notes this has improved over today.  Has not noticed it prior to today.  She notes she has had loose stools over the last week or so. ? ? ?ROS: See pertinent positives and negatives per HPI. ? ?Past Medical History:  ?Diagnosis Date  ? Arthritis   ? spine - mild  ? Colon polyp   ? Repeat colonoscopy 2018  ? COPD (chronic obstructive pulmonary disease)  ([place])   ? Diverticulitis   ? GERD (gastroesophageal reflux disease)   ? Resolved with wt loss  ? Hepatitis C 2018  ? treated with Harvoni  ? Hyperlipidemia   ? Borderline   ? Hypertension   ? Iron deficiency anemia 2014  ? Sleep apnea   ? CPAP  ? ? ?Past Surgical History:  ?Procedure Laterality Date  ? ABDOMINAL HYSTERECTOMY  1994  ? CATARACT EXTRACTION W/PHACO Right 08/30/2019  ? Procedure: CATARACT EXTRACTION PHACO AND INTRAOCULAR LENS PLACEMENT ([place]) RIGHT;  Surgeon: Birder Robson, MD;  Location: [facility];  Service: Ophthalmology;  Laterality: Right;  2.16 ?0:20.2  ? COLONOSCOPY WITH PROPOFOL N/A 02/05/2015  ? Procedure: COLONOSCOPY WITH PROPOFOL;  Surgeon: Josefine Class, MD;  Location[facility] ENDOSCOPY;  Service: Endoscopy;  Laterality: N/A;  ? EYE SURGERY    ? SALPINGOOPHORECTOMY Right 1994  ? ? ?Family History  ?Problem Relation Age of Onset  ? Heart disease Father 68  ?     CAD - died of MI  ? Hyperlipidemia Father   ? Cancer Brother 71  ?     stomach and esophageal cancer  ? Hypothyroidism Sister   ? Diabetes Paternal Grandmother   ? Breast cancer Neg Hx   ? ? ?SOCIAL HX: Former smoker ? ? ?Current Outpatient Medications:  ?  albuterol (VENTOLIN HFA) 108 (90 Base) MCG/ACT inhaler, Inhale 2 puffs into the lungs every 4 (four) hours as needed for wheezing or shortness of breath., Disp: 18 g, Rfl: 0 ?  azithromycin (ZITHROMAX Z-PAK) 250 MG tablet, 2 today then one qd x 4 days, Disp: 6 tablet, Rfl: 0 ?  cephALEXin (KEFLEX) 500 MG capsule, Take 1 capsule (500 mg total) by mouth 4 (four) times daily for 7 days., Disp: 28 capsule, Rfl: 0 ?  clobetasol ointment (TEMOVATE) 6.76 %, Apply 1 application topically 2 (two) times daily. Apply to aa's BID PRN flares., Disp: 30 g, Rfl: 1 ?  guaiFENesin-codeine 100-10 MG/5ML syrup, Take 5 mLs by mouth 3 (three) times daily as needed for cough., Disp: 120 mL, Rfl: 0 ?  pantoprazole (PROTONIX) 40 MG tablet, TAKE 1 TABLET BY MOUTH DAILY, Disp: 90 tablet, Rfl:  1 ?  predniSONE (STERAPRED UNI-PAK 21 TAB) 10 MG (21) TBPK tablet, Take by mouth daily. Take 6 tabs by mouth daily  for 2 days, then 5 tabs for 2 days, then 4 tabs for 2 days, then 3 tabs for 2 days, 2 tabs for 2 days, then 1 tab by mouth daily for 2 days, Disp: 42 tablet, Rfl: 0 ?  rosuvastatin (CRESTOR) 20 MG tablet, TAKE 1 TABLET(20 MG) BY MOUTH DAILY, Disp: 90 tablet, Rfl: 3 ?  Semaglutide-Weight Management 1 MG/0.5ML SOAJ, Inject 1 mg into the skin once a week for 28 days., Disp: 2 mL, Rfl: 0 ?  [START ON 05/20/2021] Semaglutide-Weight Management 1.7 MG/0.75ML SOAJ, Inject 1.7 mg into the skin once a week for 28 days., Disp: 3 mL, Rfl: 0 ?  [START ON 06/18/2021] Semaglutide-Weight Management 2.4 MG/0.75ML SOAJ, Inject 2.4 mg into the skin once a week for 28 days., Disp: 3 mL, Rfl: 0 ?  valsartan (DIOVAN) 160 MG tablet, Take 1 tablet (160 mg total) by mouth daily., Disp: 90 tablet, Rfl: 3 ? ?EXAM: ? ?VITALS per patient if applicable: ? ?GENERAL: alert, oriented, appears well and in no acute distress ? ?HEENT: atraumatic, conjunttiva clear, no obvious abnormalities on inspection of external nose and ears ? ?NECK: normal movements of the head and neck ? ?LUNGS: on inspection no signs of respiratory distress, breathing rate appears normal, no obvious gross SOB, gasping or wheezing ? ?CV: no obvious cyanosis ? ?MS: moves all visible extremities without noticeable abnormality ? ?PSYCH/NEURO: pleasant and cooperative, no obvious depression or anxiety, speech and thought processing grossly intact ? ?ASSESSMENT AND PLAN: ? ?Discussed the following assessment and plan: ? ?Problem List Items Addressed This Visit   ? ? Bronchitis  ?  The patient has recovered well.  She will monitor for any recurrent symptoms. ?  ?  ? UTI (urinary tract infection)  ?  The patient recently diagnosed with UTI and is still on treatment.  Discussed a follow-up urinalysis in about 3 weeks.  This was scheduled while the patient was on the visit.   She will finish her Keflex.  If she has any recurrent symptoms after discontinuing the Keflex she will let us know.  She may have some GI irritation related to being on antibiotics for several weeks.  She will monitor and if she has any worsening bloating or develops liquid stools she will let us know. ?  ?  ? Relevant Orders  ? Urinalysis, Routine w reflex microscopic  ? ? ?Return if symptoms worsen or fail to improve. ?  ?I discussed the assessment and treatment plan with the patient. The patient was provided an opportunity to ask questions and all were answered. The patient agreed with the plan and demonstrated an understanding of the instructions. ?  ?The patient was advised to call  back or seek an in-person evaluation if the symptoms worsen or if the condition fails to improve as anticipated. ? ?Tommi Rumps, MD  ? ?

## 2021-05-15 NOTE — Assessment & Plan Note (Signed)
The patient recently diagnosed with UTI and is still on treatment.  Discussed a follow-up urinalysis in about 3 weeks.  This was scheduled while the patient was on the visit.  She will finish her Keflex.  If she has any recurrent symptoms after discontinuing the Keflex she will let us know.  She may have some GI irritation related to being on antibiotics for several weeks.  She will monitor and if she has any worsening bloating or develops liquid stools she will let us know. ?

## 2021-05-15 NOTE — Assessment & Plan Note (Signed)
The patient has recovered well.  She will monitor for any recurrent symptoms. ?

## 2021-05-28 DIAGNOSIS — Z86010 Personal history of colonic polyps: Secondary | ICD-10-CM | POA: Diagnosis not present

## 2021-05-28 DIAGNOSIS — R1011 Right upper quadrant pain: Secondary | ICD-10-CM | POA: Diagnosis not present

## 2021-05-28 DIAGNOSIS — R1012 Left upper quadrant pain: Secondary | ICD-10-CM | POA: Diagnosis not present

## 2021-05-28 DIAGNOSIS — K219 Gastro-esophageal reflux disease without esophagitis: Secondary | ICD-10-CM | POA: Diagnosis not present

## 2021-06-04 ENCOUNTER — Encounter: Payer: Self-pay | Admitting: Family Medicine

## 2021-06-07 ENCOUNTER — Other Ambulatory Visit: Payer: Self-pay

## 2021-06-07 ENCOUNTER — Other Ambulatory Visit (INDEPENDENT_AMBULATORY_CARE_PROVIDER_SITE_OTHER): Payer: BC Managed Care – PPO

## 2021-06-07 DIAGNOSIS — N3001 Acute cystitis with hematuria: Secondary | ICD-10-CM

## 2021-06-07 LAB — URINALYSIS, ROUTINE W REFLEX MICROSCOPIC
Bilirubin Urine: NEGATIVE
Hgb urine dipstick: NEGATIVE
Ketones, ur: NEGATIVE
Leukocytes,Ua: NEGATIVE
Nitrite: NEGATIVE
RBC / HPF: NONE SEEN (ref 0–?)
Specific Gravity, Urine: 1.01 (ref 1.000–1.030)
Total Protein, Urine: NEGATIVE
Urine Glucose: >=1000 — AB
Urobilinogen, UA: 1 (ref 0.0–1.0)
pH: 5.5 (ref 5.0–8.0)

## 2021-06-13 ENCOUNTER — Telehealth: Payer: Self-pay

## 2021-06-13 NOTE — Telephone Encounter (Signed)
Left message on voicemail to return my call. Has she been scheduled at [facility] for baseline eye exam for Plaquenil? ?

## 2021-07-02 DIAGNOSIS — G4733 Obstructive sleep apnea (adult) (pediatric): Secondary | ICD-10-CM | POA: Diagnosis not present

## 2021-07-04 DIAGNOSIS — M349 Systemic sclerosis, unspecified: Secondary | ICD-10-CM | POA: Diagnosis not present

## 2021-07-04 LAB — HM DIABETES EYE EXAM

## 2021-07-23 ENCOUNTER — Ambulatory Visit (INDEPENDENT_AMBULATORY_CARE_PROVIDER_SITE_OTHER): Payer: BC Managed Care – PPO | Admitting: Dermatology

## 2021-07-23 DIAGNOSIS — L900 Lichen sclerosus et atrophicus: Secondary | ICD-10-CM | POA: Diagnosis not present

## 2021-07-23 MED ORDER — HYDROXYCHLOROQUINE SULFATE 200 MG PO TABS: 200.0000 mg | ORAL_TABLET | Freq: Two times a day (BID) | ORAL | 2 refills | Status: DC

## 2021-07-23 NOTE — Progress Notes (Signed)
? ?  Follow-Up Visit ?  ?Subjective  ?Linda Henson is a 66 y.o. female who presents for the following: lichen schlerosus et atrophicus (3 month follow up. Currently using clobetasol as needed when flared. Reports recent flare in last week.).  Overall, hasn't had many symptoms, mainly itching. No pain and burning.  Just had retinal exam last week, so planning on starting plaquenil. ? ? ?The following portions of the chart were reviewed this encounter and updated as appropriate:   ?  ? ?Review of Systems: No other skin or systemic complaints except as noted in HPI or Assessment and Plan. ? ? ?Objective  ?Well appearing patient in no apparent distress; mood and affect are within normal limits. ? ?A focused examination was performed including vaginal area. Relevant physical exam findings are noted in the Assessment and Plan. ? ?vaginal area ?Erythema with hypopigmented patch at right super labia minora, labia minora with agglutination, white scar at inferior introitus, no erosions on exam today ? ? ?Assessment & Plan  ?Lichen sclerosus et atrophicus ?vaginal area ? ?Chronic and persistent condition with duration or expected duration over one year. Improving but not currently at goal.  ? ?Retina exam has been completed per pt- will request records ?Bx proven LS&A, no precancer/cancerous changes ?Reviewed baseline labs from 02/2021 - Normal  ? ?Start Plaquenil 200 mg tablet - take 1 tabs (400 mg) by mouth BID ? ?Continue clobetasol twice daily to vaginal area, prn itching, pain, burning  ?Ordered labs (cmp and cdiff) to have collected in 2 months  ? ?  ?Lichen sclerosus is a chronic inflammatory condition of unknown cause that frequently involves the vaginal area and less commonly extragenital skin, and is NOT sexually transmitted. It frequently causes symptoms of pain and burning.  It requires regular monitoring and treatment with topical steroids to minimize inflammation and to reduce risk of scarring. There is also  a risk of cancer in the vaginal area which is very low if inflammation is well controlled. Regular checks of the area are recommended. Please call if you notice any new or changing spots within this area. ?  ? ?CBC with Differential/Platelets - vaginal area ? ?CMP - vaginal area ? ?hydroxychloroquine (PLAQUENIL) 200 MG tablet - vaginal area ?Take 1 tablet (200 mg total) by mouth 2 (two) times daily. ? ?Related Medications ?clobetasol ointment (TEMOVATE) 0.05 % ?Apply 1 application topically 2 (two) times daily. Apply to aa's BID PRN flares. ? ? ?Return in about 4 months (around 11/23/2021) for LS & A follow up. ?I, Ruthell Rummage, CMA, am acting as scribe for Brendolyn Patty, MD. ? ?Documentation: I have reviewed the above documentation for accuracy and completeness, and I agree with the above. ? ?Brendolyn Patty MD  ? ?

## 2021-07-23 NOTE — Patient Instructions (Addendum)
Topical steroids (such as triamcinolone, fluocinolone, fluocinonide, mometasone, clobetasol, halobetasol, betamethasone, hydrocortisone) can cause thinning and lightening of the skin if they are used for too long in the same area. Your physician has selected the right strength medicine for your problem and area affected on the body. Please use your medication only as directed by your physician to prevent side effects.  ? ? ? ?If You Need Anything After Your Visit ? ?If you have any questions or concerns for your doctor, please call our main line at [phone] and press option 4 to reach your doctor's medical assistant. If no one answers, please leave a voicemail as directed and we will return your call as soon as possible. Messages left after 4 pm will be answered the following business day.  ? ?You may also send [place] a message via MyChart. We typically respond to MyChart messages within 1-2 business days. ? ?For prescription refills, please ask your pharmacy to contact our office. Our fax number is [phone]. ? ?If you have an urgent issue when the clinic is closed that cannot wait until the next business day, you can page your doctor at the number below.   ? ?Please note that while we do our best to be available for urgent issues outside of office hours, we are not available 24/7.  ? ?If you have an urgent issue and are unable to reach [place], you may choose to seek medical care at your doctor's office, retail clinic, urgent care center, or emergency room. ? ?If you have a medical emergency, please immediately call 911 or go to the emergency department. ? ?Pager Numbers ? ?- Dr. Nehemiah Massed: [phone] ? ?- Dr. Laurence Ferrari: [phone] ? ?- Dr. Nicole Kindred: [phone] ? ?In the event of inclement weather, please call our main line at [phone] for an update on the status of any delays or closures. ? ?Dermatology Medication Tips: ?Please keep the boxes that topical medications come in in order to help keep track of the  instructions about where and how to use these. Pharmacies typically print the medication instructions only on the boxes and not directly on the medication tubes.  ? ?If your medication is too expensive, please contact our office at [phone] option 4 or send [place] a message through [place].  ? ?We are unable to tell what your co-pay for medications will be in advance as this is different depending on your insurance coverage. However, we may be able to find a substitute medication at lower cost or fill out paperwork to get insurance to cover a needed medication.  ? ?If a prior authorization is required to get your medication covered by your insurance company, please allow [place] 1-2 business days to complete this process. ? ?Drug prices often vary depending on where the prescription is filled and some pharmacies may offer cheaper prices. ? ?The website www.goodrx.com contains coupons for medications through different pharmacies. The prices here do not account for what the cost may be with help from insurance (it may be cheaper with your insurance), but the website can give you the price if you did not use any insurance.  ?- You can print the associated coupon and take it with your prescription to the pharmacy.  ?- You may also stop by our office during regular business hours and pick up a GoodRx coupon card.  ?- If you need your prescription sent electronically to a different pharmacy, notify our office through [facility] or by phone at [phone] option 4. ? ? ? ? ?  Si Usted Necesita Algo Despu?s de Su Visita ? ?Tambi?n puede enviarnos un mensaje a trav?s de MyChart. Por lo general respondemos a los mensajes de MyChart en el transcurso de 1 a 2 d?as h?biles. ? ?Para renovar recetas, por favor pida a su farmacia que se ponga en contacto con nuestra oficina. Nuestro n?mero de fax es el [phone]. ? ?Si tiene un asunto urgente cuando la cl?nica est? cerrada y que no puede esperar hasta el siguiente d?a h?bil,  puede llamar/localizar a su doctor(a) al n?mero que aparece a continuaci?n.  ? ?Por favor, tenga en cuenta que aunque hacemos todo lo posible para estar disponibles para asuntos urgentes fuera del horario de oficina, no estamos disponibles las 24 horas del d?a, los 7 d?as de la semana.  ? ?Si tiene un problema urgente y no puede comunicarse con nosotros, puede optar por buscar atenci?n m?dica  en el consultorio de su doctor(a), en una cl?nica privada, en un centro de atenci?n urgente o en una sala de emergencias. ? ?Si tiene Engineer, maintenance (IT) m?dica, por favor llame inmediatamente al 911 o vaya a la sala de emergencias. ? ?N?meros de b?per ? ?- Dr. Nehemiah Massed: [phone] ? ?- Dra. Moye: [phone] ? ?- Dra. Nicole Kindred: [phone] ? ?En caso de inclemencias del tiempo, por favor llame a nuestra l?nea principal al [phone] para una actualizaci?n sobre el estado de cualquier retraso o cierre. ? ?Consejos para la medicaci?n en dermatolog?a: ?Por favor, guarde las cajas en las que vienen los medicamentos de uso t?pico para ayudarle a seguir las instrucciones sobre d?nde y c?mo usarlos. Las farmacias generalmente imprimen las instrucciones del medicamento s?lo en las cajas y no directamente en los tubos del [place].  ? ?Si su medicamento es muy caro, por favor, p?ngase en contacto con Zigmund Daniel llamando al [phone] y presione la opci?n 4 o env?enos un mensaje a trav?s de MyChart.  ? ?No podemos decirle cu?l ser? su copago por los medicamentos por adelantado ya que esto es diferente dependiendo de la cobertura de su seguro. Sin embargo, es posible que podamos encontrar un medicamento sustituto a Electrical engineer un formulario para que el seguro cubra el medicamento que se considera necesario.  ? ?Si se requiere Ardelia Mems autorizaci?n previa para que su compa??a de seguros [place] su medicamento, por favor perm?tanos de 1 a 2 d?as h?biles para completar este proceso. ? ?Los precios de los medicamentos var?an con  frecuencia dependiendo del Environmental consultant de d?nde se surte la receta y alguna farmacias pueden ofrecer precios m?s baratos. ? ?El sitio web www.goodrx.com tiene cupones para medicamentos de Airline pilot. Los precios aqu? no tienen en cuenta lo que podr?a costar con la ayuda del seguro (puede ser m?s barato con su seguro), pero el sitio web puede darle el precio si no utiliz? ning?n seguro.  ?- Puede imprimir el cup?n correspondiente y llevarlo con su receta a la farmacia.  ?- Tambi?n puede pasar por nuestra oficina durante el horario de atenci?n regular y recoger una tarjeta de cupones de GoodRx.  ?- Si necesita que su receta se env?e electr?nicamente a Chiropodist, informe a nuestra oficina a trav?s de MyChart de [place] o por tel?fono llamando al [phone] y presione la opci?n 4. ? ?

## 2021-07-25 ENCOUNTER — Telehealth: Payer: Self-pay

## 2021-07-25 NOTE — Telephone Encounter (Signed)
Patient was recently seen in office to start hydroxychloroquine for LS&A, needed to review patient's baseline retinal exam before starting patient on medication.  ? ?Spoke with Amy (in medical records at [facility]) and discussed need for records faxed over for provider. She will faxed records over. ?

## 2021-08-16 ENCOUNTER — Encounter: Payer: Self-pay | Admitting: Gastroenterology

## 2021-08-18 ENCOUNTER — Encounter: Payer: Self-pay | Admitting: Gastroenterology

## 2021-08-18 NOTE — H&P (Signed)
Pre-Procedure H&P   Patient ID: Linda Henson is a 66 y.o. female.  Gastroenterology Provider: Annamaria Helling, DO  Referring Provider: Laurine Blazer, PA PCP: Leone Haven, MD  Date: 08/19/2021  HPI [name] is a 65 y.o. female who presents today for Colonoscopy for Surveillance-personal history of colon polyps. Undergoing colonoscopy for personal history of colon polyps.  Bowel movements have been normal without melena hematochezia diarrhea or constipation. She did have brbpr in April for a few weeks. Painless and stopped on it's own. Would occur with morning BM and resolve with further BMs She does have a history of gastric ulcer and iron deficiency anemia in 2014.  Seldomly takes aleve/nsaids HCV s/p SVR with Harvoni Last colonoscopy in November 2016 with internal and external hemorrhoids and 1 tubular adenomatous polyp.  Other colonoscopies in 2009 and 2012 demonstrated diverticulosis. She is status post hysterectomy CT in December 2022 demonstrating left-sided diverticulosis hiatal hernia and fatty liver disease EGD in 2008 and 2012 with hiatal hernia and Schatzki's ring noted. Most recent lab work hemoglobin 14 MCV 98 platelets 1 59,000 creatinine 0.85  Past Medical History:  Diagnosis Date   Arthritis    spine - mild   Colon polyp    Repeat colonoscopy 2018   COPD (chronic obstructive pulmonary disease) (HCC)    Diverticulitis    GERD (gastroesophageal reflux disease)    Resolved with wt loss   Hepatitis C 2018   treated with Harvoni   History of gastric ulcer    Hyperlipidemia    Borderline    Hypertension    Iron deficiency anemia 2014   Sleep apnea    CPAP    Past Surgical History:  Procedure Laterality Date   ABDOMINAL HYSTERECTOMY  1994   CATARACT EXTRACTION W/PHACO Right 08/30/2019   Procedure: CATARACT EXTRACTION PHACO AND INTRAOCULAR LENS PLACEMENT ([place]) RIGHT;  Surgeon: Birder Robson, MD;  Location: [place];   Service: Ophthalmology;  Laterality: Right;  2.16 0:20.2   COLONOSCOPY WITH PROPOFOL N/A 02/05/2015   Procedure: COLONOSCOPY WITH PROPOFOL;  Surgeon: Josefine Class, MD;  [facility] ENDOSCOPY;  Service: Endoscopy;  Laterality: N/A;   EYE SURGERY     SALPINGOOPHORECTOMY Right 1994    Family History Brother-esophageal and gastric cancer No other h/o GI disease or malignancy  Review of Systems  Constitutional:  Negative for activity change, appetite change, chills, diaphoresis, fatigue, fever and unexpected weight change.  HENT:  Negative for trouble swallowing and voice change.   Respiratory:  Negative for shortness of breath and wheezing.   Cardiovascular:  Negative for chest pain, palpitations and leg swelling.  Gastrointestinal:  Negative for abdominal distention, abdominal pain, anal bleeding, blood in stool, constipation, diarrhea, nausea, rectal pain and vomiting.  Musculoskeletal:  Negative for arthralgias and myalgias.  Skin:  Negative for color change and pallor.  Neurological:  Negative for dizziness, syncope and weakness.  Psychiatric/Behavioral:  Negative for confusion.   All other systems reviewed and are negative.   Medications No current facility-administered medications on file prior to encounter.   Current Outpatient Medications on File Prior to Encounter  Medication Sig Dispense Refill   pantoprazole (PROTONIX) 40 MG tablet TAKE 1 TABLET BY MOUTH DAILY 90 tablet 1   rosuvastatin (CRESTOR) 20 MG tablet TAKE 1 TABLET(20 MG) BY MOUTH DAILY 90 tablet 3   valsartan (DIOVAN) 160 MG tablet Take 1 tablet (160 mg total) by mouth daily. 90 tablet 3   albuterol (VENTOLIN HFA)  108 (90 Base) MCG/ACT inhaler Inhale 2 puffs into the lungs every 4 (four) hours as needed for wheezing or shortness of breath. 18 g 0   clobetasol ointment (TEMOVATE) 6.28 % Apply 1 application topically 2 (two) times daily. Apply to aa's BID PRN flares. 30 g 1   guaiFENesin-codeine 100-10 MG/5ML  syrup Take 5 mLs by mouth 3 (three) times daily as needed for cough. 120 mL 0    Pertinent medications related to GI and procedure were reviewed by me with the patient prior to the procedure   Current Facility-Administered Medications:    0.9 %  sodium chloride infusion, , Intravenous, Continuous, Annamaria Helling, DO   0.9 %  sodium chloride infusion, , Intravenous, Continuous, Annamaria Helling, DO      No Known Allergies Allergies were reviewed by me prior to the procedure  Objective   Body mass index is 35.61 kg/m. Vitals:   08/19/21 0816  BP: (!) 161/92  Pulse: (!) 55  Resp: 16  Temp: (!) 97.3 F (36.3 C)  TempSrc: Temporal  SpO2: 98%  Weight: 97.1 kg  Height: '5\' 5"'$  (1.651 m)     Physical Exam Vitals and nursing note reviewed.  Constitutional:      General: She is not in acute distress.    Appearance: Normal appearance. She is obese. She is not ill-appearing, toxic-appearing or diaphoretic.  HENT:     Head: Normocephalic and atraumatic.     Nose: Nose normal.     Mouth/Throat:     Mouth: Mucous membranes are moist.     Pharynx: Oropharynx is clear.  Eyes:     General: No scleral icterus.    Extraocular Movements: Extraocular movements intact.  Cardiovascular:     Rate and Rhythm: Normal rate and regular rhythm.     Heart sounds: Normal heart sounds. No murmur heard.   No friction rub. No gallop.  Pulmonary:     Effort: Pulmonary effort is normal. No respiratory distress.     Breath sounds: Normal breath sounds. No wheezing, rhonchi or rales.  Abdominal:     General: Bowel sounds are normal. There is no distension.     Palpations: Abdomen is soft.     Tenderness: There is no abdominal tenderness. There is no guarding or rebound.  Musculoskeletal:     Cervical back: Neck supple.     Right lower leg: No edema.     Left lower leg: No edema.  Skin:    General: Skin is warm and dry.     Coloration: Skin is not jaundiced or pale.  Neurological:      General: No focal deficit present.     Mental Status: She is alert and oriented to person, place, and time. Mental status is at baseline.  Psychiatric:        Mood and Affect: Mood normal.        Behavior: Behavior normal.        Thought Content: Thought content normal.        Judgment: Judgment normal.     Assessment:  Linda Henson is a 66 y.o. female  who presents today for Colonoscopy for Surveillance-personal history of colon polyps.  Plan:  Colonoscopy with possible intervention today  Colonoscopy with possible biopsy, control of bleeding, polypectomy, and interventions as necessary has been discussed with the patient/patient representative. Informed consent was obtained from the patient/patient representative after explaining the indication, nature, and risks of the procedure including but not limited  to death, bleeding, perforation, missed neoplasm/lesions, cardiorespiratory compromise, and reaction to medications. Opportunity for questions was given and appropriate answers were provided. Patient/patient representative has verbalized understanding is amenable to undergoing the procedure.   Annamaria Helling, DO  [facility]  Portions of the record may have been created with voice recognition software. Occasional wrong-word or 'sound-a-like' substitutions may have occurred due to the inherent limitations of voice recognition software.  Read the chart carefully and recognize, using context, where substitutions may have occurred.

## 2021-08-19 ENCOUNTER — Ambulatory Visit
Admission: RE | Admit: 2021-08-19 | Discharge: 2021-08-19 | Disposition: A | Discharge status: Home / Self Care | Payer: BC Managed Care – PPO | Attending: Gastroenterology | Admitting: Gastroenterology

## 2021-08-19 ENCOUNTER — Encounter
Admission: RE | Disposition: A | Discharge status: Home / Self Care | Payer: Self-pay | Source: Home / Self Care | Attending: Gastroenterology

## 2021-08-19 ENCOUNTER — Ambulatory Visit: Payer: BC Managed Care – PPO | Admitting: Anesthesiology

## 2021-08-19 ENCOUNTER — Encounter: Payer: Self-pay | Admitting: Gastroenterology

## 2021-08-19 DIAGNOSIS — Z86010 Personal history of colonic polyps: Secondary | ICD-10-CM | POA: Diagnosis not present

## 2021-08-19 DIAGNOSIS — D120 Benign neoplasm of cecum: Secondary | ICD-10-CM | POA: Insufficient documentation

## 2021-08-19 DIAGNOSIS — G4730 Sleep apnea, unspecified: Secondary | ICD-10-CM | POA: Diagnosis not present

## 2021-08-19 DIAGNOSIS — J449 Chronic obstructive pulmonary disease, unspecified: Secondary | ICD-10-CM | POA: Diagnosis not present

## 2021-08-19 DIAGNOSIS — Z1211 Encounter for screening for malignant neoplasm of colon: Secondary | ICD-10-CM | POA: Insufficient documentation

## 2021-08-19 DIAGNOSIS — K6289 Other specified diseases of anus and rectum: Secondary | ICD-10-CM | POA: Diagnosis not present

## 2021-08-19 DIAGNOSIS — Z87891 Personal history of nicotine dependence: Secondary | ICD-10-CM | POA: Diagnosis not present

## 2021-08-19 DIAGNOSIS — K635 Polyp of colon: Secondary | ICD-10-CM | POA: Diagnosis not present

## 2021-08-19 DIAGNOSIS — I10 Essential (primary) hypertension: Secondary | ICD-10-CM | POA: Insufficient documentation

## 2021-08-19 DIAGNOSIS — D123 Benign neoplasm of transverse colon: Secondary | ICD-10-CM | POA: Diagnosis not present

## 2021-08-19 DIAGNOSIS — K644 Residual hemorrhoidal skin tags: Secondary | ICD-10-CM | POA: Insufficient documentation

## 2021-08-19 DIAGNOSIS — Z79899 Other long term (current) drug therapy: Secondary | ICD-10-CM | POA: Diagnosis not present

## 2021-08-19 DIAGNOSIS — K621 Rectal polyp: Secondary | ICD-10-CM | POA: Insufficient documentation

## 2021-08-19 DIAGNOSIS — K219 Gastro-esophageal reflux disease without esophagitis: Secondary | ICD-10-CM | POA: Insufficient documentation

## 2021-08-19 DIAGNOSIS — K648 Other hemorrhoids: Secondary | ICD-10-CM | POA: Insufficient documentation

## 2021-08-19 DIAGNOSIS — K5730 Diverticulosis of large intestine without perforation or abscess without bleeding: Secondary | ICD-10-CM | POA: Insufficient documentation

## 2021-08-19 HISTORY — DX: Personal history of peptic ulcer disease: Z87.11

## 2021-08-19 HISTORY — PX: COLONOSCOPY: SHX5424

## 2021-08-19 SURGERY — COLONOSCOPY
Anesthesia: General

## 2021-08-19 MED ORDER — PROPOFOL 500 MG/50ML IV EMUL
INTRAVENOUS | Status: DC | PRN
  Administered 2021-08-19: 130 ug/kg/min via INTRAVENOUS

## 2021-08-19 MED ORDER — SODIUM CHLORIDE 0.9 % IV SOLN: INTRAVENOUS | Status: DC

## 2021-08-19 MED ORDER — LIDOCAINE HCL (CARDIAC) PF 100 MG/5ML IV SOSY
PREFILLED_SYRINGE | INTRAVENOUS | Status: DC | PRN
  Administered 2021-08-19: 50 mg via INTRAVENOUS

## 2021-08-19 MED ORDER — DEXMEDETOMIDINE HCL IN NACL 200 MCG/50ML IV SOLN
INTRAVENOUS | Status: DC | PRN
  Administered 2021-08-19: 8 ug via INTRAVENOUS

## 2021-08-19 MED ORDER — PROPOFOL 10 MG/ML IV BOLUS
INTRAVENOUS | Status: DC | PRN
  Administered 2021-08-19: 100 mg via INTRAVENOUS
  Administered 2021-08-19: 50 mg via INTRAVENOUS

## 2021-08-19 MED ORDER — PROPOFOL 10 MG/ML IV BOLUS
INTRAVENOUS | Status: AC
  Filled 2021-08-19: qty 20

## 2021-08-19 NOTE — Op Note (Signed)
[facility]astro[facility] Patient Name: Linda Henson Procedure Date: 08/19/2021 8:34 AM MRN: [id number] Account #: [id number] Date of Birth: 1955-06-30 Admit Type: Outpatient Age: 66 Room: [facility] ENDO ROOM 2 Gender: Female Note Status: Finalized Instrument Name: Colonoscope [id number] Procedure:             Colonoscopy Indications:           High risk colon cancer surveillance: Personal history                         of colonic polyps Providers:             Annamaria Helling DO, DO Medicines:             Monitored Anesthesia Care Complications:         No immediate complications. Estimated blood loss:                         Minimal. Procedure:             Pre-Anesthesia Assessment:                        - Prior to the procedure, a History and Physical was                         performed, and patient medications and allergies were                         reviewed. The patient is competent. The risks and                         benefits of the procedure and the sedation options and                         risks were discussed with the patient. All questions                         were answered and informed consent was obtained.                         Patient identification and proposed procedure were                         verified by the physician, the nurse, the anesthetist                         and the technician in the endoscopy suite. Mental                         Status Examination: alert and oriented. Airway                         Examination: normal oropharyngeal airway and neck                         mobility. Respiratory Examination: clear to                         auscultation. CV Examination: RRR, no murmurs, no S3  or S4. Prophylactic Antibiotics: The patient does not                         require prophylactic antibiotics. Prior                         Anticoagulants: The patient has taken no previous                          anticoagulant or antiplatelet agents. ASA Grade                         Assessment: III - A patient with severe systemic                         disease. After reviewing the risks and benefits, the                         patient was deemed in satisfactory condition to                         undergo the procedure. The anesthesia plan was to use                         monitored anesthesia care (MAC). Immediately prior to                         administration of medications, the patient was                         re-assessed for adequacy to receive sedatives. The                         heart rate, respiratory rate, oxygen saturations,                         blood pressure, adequacy of pulmonary ventilation, and                         response to care were monitored throughout the                         procedure. The physical status of the patient was                         re-assessed after the procedure.                        After obtaining informed consent, the colonoscope was                         passed under direct vision. Throughout the procedure,                         the patient's blood pressure, pulse, and oxygen                         saturations were monitored continuously. The  Colonoscope was introduced through the anus and                         advanced to the the terminal ileum, with                         identification of the appendiceal orifice and IC                         valve. The colonoscopy was performed without                         difficulty. The patient tolerated the procedure well.                         The quality of the bowel preparation was evaluated                         using the BBPS [facility] Bowel Preparation Scale) with                         scores of: Right Colon = 3, Transverse Colon = 3 and                         Left Colon = 3 (entire mucosa seen well with no                          residual staining, small fragments of stool or opaque                         liquid). The total BBPS score equals 9. The terminal                         ileum, ileocecal valve, appendiceal orifice, and                         rectum were photographed. Findings:      Hemorrhoids were found on perianal exam.      The digital rectal exam was normal. Pertinent negatives include normal       sphincter tone.      The terminal ileum appeared normal. Estimated blood loss: none.      Scattered small-mouthed diverticula were found in the left colon.       Estimated blood loss: none.      Non-bleeding external and internal hemorrhoids were found during       retroflexion and during perianal exam. Estimated blood loss: none.      Five sessile polyps were found in the rectum (1) and cecum (4). The       polyps were 1 to 2 mm in size. These polyps were removed with a cold       biopsy forceps. Resection and retrieval were complete. Estimated blood       loss was minimal.      Four sessile polyps were found in the transverse colon and cecum. The       polyps were 3 to 5 mm in size. These polyps were removed with a cold       snare. Resection and retrieval were complete.  Estimated blood loss was       minimal.      The exam was otherwise without abnormality on direct and retroflexion       views. Impression:            - Hemorrhoids found on perianal exam.                        - The examined portion of the ileum was normal.                        - Diverticulosis in the left colon.                        - Non-bleeding external and internal hemorrhoids.                        - Five 1 to 2 mm polyps in the rectum and in the                         cecum, removed with a cold biopsy forceps. Resected                         and retrieved.                        - Four 3 to 5 mm polyps in the transverse colon and in                         the cecum, removed with a cold snare. Resected and                          retrieved.                        - The examination was otherwise normal on direct and                         retroflexion views. Recommendation:        - Discharge patient to home.                        - Resume previous diet.                        - Continue present medications.                        - No aspirin, ibuprofen, naproxen, or other                         non-steroidal anti-inflammatory drugs for 5 days after                         polyp removal.                        - Await pathology results.                        - Repeat colonoscopy for surveillance based on  pathology results.                        - Return to referring physician as previously                         scheduled.                        - The findings and recommendations were discussed with                         the patient. Procedure Code(s):     --- Professional ---                        [phone], Colonoscopy, flexible; with removal of                         tumor(s), polyp(s), or other lesion(s) by snare                         technique                        45380, 81, Colonoscopy, flexible; with biopsy, single                         or multiple Diagnosis Code(s):     --- Professional ---                        Z86.010, Personal history of colonic polyps                        K64.8, Other hemorrhoids                        K62.1, Rectal polyp                        K63.5, Polyp of colon                        K57.30, Diverticulosis of large intestine without                         perforation or abscess without bleeding CPT copyright 2019 American Medical Association. All rights reserved. The codes documented in this report are preliminary and upon coder review may  be revised to meet current compliance requirements. Attending Participation:      I personally performed the entire procedure. Volney American, DO Annamaria Helling DO, DO 08/19/2021  9:19:19 AM This report has been signed electronically. Number of Addenda: 0 Note Initiated On: 08/19/2021 8:34 AM Scope Withdrawal Time: 0 hours 24 minutes 46 seconds  Total Procedure Duration: 0 hours 29 minutes 29 seconds  Estimated Blood Loss:  Estimated blood loss was minimal.      [facility]

## 2021-08-19 NOTE — Interval H&P Note (Signed)
History and Physical Interval Note: Preprocedure H&P from 08/19/21  was reviewed and there was no interval change after seeing and examining the patient.  Written consent was obtained from the patient after discussion of risks, benefits, and alternatives. Patient has consented to proceed with Colonoscopy with possible intervention   08/19/2021 8:33 AM  Linda Henson  has presented today for surgery, with the diagnosis of Personal history of colonic polyps (Z86.010).  The various methods of treatment have been discussed with the patient and family. After consideration of risks, benefits and other options for treatment, the patient has consented to  Procedure(s): COLONOSCOPY (N/A) as a surgical intervention.  The patient's history has been reviewed, patient examined, no change in status, stable for surgery.  I have reviewed the patient's chart and labs.  Questions were answered to the patient's satisfaction.     Annamaria Helling

## 2021-08-19 NOTE — Transfer of Care (Signed)
Immediate Anesthesia Transfer of Care Note  Patient: Linda Henson  Procedure(s) Performed: COLONOSCOPY  Patient Location: PACU and Endoscopy Unit  Anesthesia Type:General  Level of Consciousness: drowsy and patient cooperative  Airway & Oxygen Therapy: Patient Spontanous Breathing  Post-op Assessment: Report given to RN and Post -op Vital signs reviewed and stable  Post vital signs: Reviewed and stable  Last Vitals:  Vitals Value Taken Time  BP 104/58 08/19/21 0916  Temp 35.9 C 08/19/21 0914  Pulse 71 08/19/21 0915  Resp 16 08/19/21 0915  SpO2 97 % 08/19/21 0915  Vitals shown include unvalidated device data.  Last Pain:  Vitals:   08/19/21 0914  TempSrc: Temporal         Complications: No notable events documented.

## 2021-08-19 NOTE — Anesthesia Preprocedure Evaluation (Signed)
Anesthesia Evaluation  Patient identified by MRN, date of birth, ID band Patient awake    Reviewed: Allergy & Precautions, H&P , NPO status , Patient's Chart, lab work & pertinent test results, reviewed documented beta blocker date and time   Airway Mallampati: II   Neck ROM: full    Dental  (+) Poor Dentition   Pulmonary sleep apnea , COPD, former smoker,    Pulmonary exam normal        Cardiovascular Exercise Tolerance: Good hypertension, On Medications negative cardio ROS Normal cardiovascular exam Rhythm:regular Rate:Normal     Neuro/Psych PSYCHIATRIC DISORDERS Depression negative neurological ROS     GI/Hepatic GERD  Medicated,(+) Hepatitis -  Endo/Other  negative endocrine ROS  Renal/GU negative Renal ROS  negative genitourinary   Musculoskeletal   Abdominal   Peds  Hematology  (+) Blood dyscrasia, anemia ,   Anesthesia Other Findings Past Medical History: No date: Arthritis     Comment:  spine - mild No date: Colon polyp     Comment:  Repeat colonoscopy 2018 No date: COPD (chronic obstructive pulmonary disease) (HCC) No date: Diverticulitis No date: GERD (gastroesophageal reflux disease)     Comment:  Resolved with wt loss 2018: Hepatitis C     Comment:  treated with Harvoni No date: History of gastric ulcer No date: Hyperlipidemia     Comment:  Borderline  No date: Hypertension 2014: Iron deficiency anemia No date: Sleep apnea     Comment:  CPAP Past Surgical History: 1994: ABDOMINAL HYSTERECTOMY 08/30/2019: CATARACT EXTRACTION W/PHACO; Right     Comment:  Procedure: CATARACT EXTRACTION PHACO AND INTRAOCULAR               LENS PLACEMENT ([facility]) RIGHT;  Surgeon: Birder Robson,               MD;  Location: [place];  Service:               Ophthalmology;  Laterality: Right;  2.16 0:20.2 02/05/2015: COLONOSCOPY WITH PROPOFOL; N/A     Comment:  Procedure: COLONOSCOPY WITH PROPOFOL;   Surgeon: Josefine Class, MD;  Location: [facility] ENDOSCOPY;  Service:               Endoscopy;  Laterality: N/A; No date: EYE SURGERY 1994: SALPINGOOPHORECTOMY; Right BMI    Body Mass Index: 35.61 kg/m     Reproductive/Obstetrics negative OB ROS                             Anesthesia Physical Anesthesia Plan  ASA: 3  Anesthesia Plan: General   Post-op Pain Management:    Induction:   PONV Risk Score and Plan:   Airway Management Planned:   Additional Equipment:   Intra-op Plan:   Post-operative Plan:   Informed Consent: I have reviewed the patients History and Physical, chart, labs and discussed the procedure including the risks, benefits and alternatives for the proposed anesthesia with the patient or authorized representative who has indicated his/her understanding and acceptance.     Dental Advisory Given  Plan Discussed with: CRNA  Anesthesia Plan Comments:         Anesthesia Quick Evaluation

## 2021-08-19 NOTE — Anesthesia Postprocedure Evaluation (Signed)
Anesthesia Post Note  Patient: Linda Henson  Procedure(s) Performed: COLONOSCOPY  Patient location during evaluation: PACU Anesthesia Type: General Level of consciousness: awake and alert Pain management: pain level controlled Vital Signs Assessment: post-procedure vital signs reviewed and stable Respiratory status: spontaneous breathing, nonlabored ventilation, respiratory function stable and patient connected to nasal cannula oxygen Cardiovascular status: blood pressure returned to baseline and stable Postop Assessment: no apparent nausea or vomiting Anesthetic complications: no   No notable events documented.   Last Vitals:  Vitals:   08/19/21 0816 08/19/21 0914  BP: (!) 161/92 (!) 104/58  Pulse: (!) 55   Resp: 16 16  Temp: (!) 36.3 C (!) 35.9 C  SpO2: 98% 97%    Last Pain:  Vitals:   08/19/21 0914  TempSrc: Temporal                 Molli Barrows

## 2021-08-20 ENCOUNTER — Encounter: Payer: Self-pay | Admitting: Gastroenterology

## 2021-08-20 LAB — SURGICAL PATHOLOGY

## 2021-09-10 DIAGNOSIS — M170 Bilateral primary osteoarthritis of knee: Secondary | ICD-10-CM | POA: Diagnosis not present

## 2021-09-10 DIAGNOSIS — M25561 Pain in right knee: Secondary | ICD-10-CM | POA: Diagnosis not present

## 2021-09-10 DIAGNOSIS — M25461 Effusion, right knee: Secondary | ICD-10-CM | POA: Diagnosis not present

## 2021-09-10 DIAGNOSIS — M25462 Effusion, left knee: Secondary | ICD-10-CM | POA: Diagnosis not present

## 2021-10-02 DIAGNOSIS — G4733 Obstructive sleep apnea (adult) (pediatric): Secondary | ICD-10-CM | POA: Diagnosis not present

## 2021-10-11 DIAGNOSIS — D3131 Benign neoplasm of right choroid: Secondary | ICD-10-CM | POA: Diagnosis not present

## 2021-10-14 ENCOUNTER — Ambulatory Visit
Admission: RE | Admit: 2021-10-14 | Discharge: 2021-10-14 | Disposition: A | Discharge status: Home / Self Care | Payer: BC Managed Care – PPO | Source: Ambulatory Visit | Attending: Acute Care | Admitting: Acute Care

## 2021-10-14 DIAGNOSIS — F17200 Nicotine dependence, unspecified, uncomplicated: Secondary | ICD-10-CM | POA: Diagnosis not present

## 2021-10-14 DIAGNOSIS — Z87891 Personal history of nicotine dependence: Secondary | ICD-10-CM | POA: Insufficient documentation

## 2021-10-14 DIAGNOSIS — F17210 Nicotine dependence, cigarettes, uncomplicated: Secondary | ICD-10-CM | POA: Diagnosis not present

## 2021-10-15 ENCOUNTER — Other Ambulatory Visit: Payer: Self-pay | Admitting: Acute Care

## 2021-10-15 DIAGNOSIS — Z122 Encounter for screening for malignant neoplasm of respiratory organs: Secondary | ICD-10-CM

## 2021-10-15 DIAGNOSIS — Z87891 Personal history of nicotine dependence: Secondary | ICD-10-CM

## 2021-10-15 DIAGNOSIS — F17210 Nicotine dependence, cigarettes, uncomplicated: Secondary | ICD-10-CM

## 2021-10-22 ENCOUNTER — Other Ambulatory Visit: Payer: Self-pay | Admitting: Dermatology

## 2021-10-22 DIAGNOSIS — L900 Lichen sclerosus et atrophicus: Secondary | ICD-10-CM

## 2021-11-26 ENCOUNTER — Other Ambulatory Visit: Payer: Self-pay | Admitting: Family Medicine

## 2021-11-26 ENCOUNTER — Ambulatory Visit (INDEPENDENT_AMBULATORY_CARE_PROVIDER_SITE_OTHER): Payer: BC Managed Care – PPO | Admitting: Dermatology

## 2021-11-26 DIAGNOSIS — L900 Lichen sclerosus et atrophicus: Secondary | ICD-10-CM

## 2021-11-26 MED ORDER — CLOBETASOL PROPIONATE 0.05 % EX OINT: 1.0000 | TOPICAL_OINTMENT | Freq: Two times a day (BID) | CUTANEOUS | 1 refills | Status: DC

## 2021-11-26 NOTE — Patient Instructions (Addendum)
Topical steroids (such as triamcinolone, fluocinolone, fluocinonide, mometasone, clobetasol, halobetasol, betamethasone, hydrocortisone) can cause thinning and lightening of the skin if they are used for too long in the same area. Your physician has selected the right strength medicine for your problem and area affected on the body. Please use your medication only as directed by your physician to prevent side effects.    Due to recent changes in healthcare laws, you may see results of your pathology and/or laboratory studies on MyChart before the doctors have had a chance to review them. We understand that in some cases there may be results that are confusing or concerning to you. Please understand that not all results are received at the same time and often the doctors may need to interpret multiple results in order to provide you with the best plan of care or course of treatment. Therefore, we ask that you please give us 2 business days to thoroughly review all your results before contacting the office for clarification. Should we see a critical lab result, you will be contacted sooner.   If You Need Anything After Your Visit  If you have any questions or concerns for your doctor, please call our main line at [phone] and press option 4 to reach your doctor's medical assistant. If no one answers, please leave a voicemail as directed and we will return your call as soon as possible. Messages left after 4 pm will be answered the following business day.   You may also send us a message via MyChart. We typically respond to MyChart messages within 1-2 business days.  For prescription refills, please ask your pharmacy to contact our office. Our fax number is [phone].  If you have an urgent issue when the clinic is closed that cannot wait until the next business day, you can page your doctor at the number below.    Please note that while we do our best to be available for urgent issues outside of  office hours, we are not available 24/7.   If you have an urgent issue and are unable to reach us, you may choose to seek medical care at your doctor's office, retail clinic, urgent care center, or emergency room.  If you have a medical emergency, please immediately call 911 or go to the emergency department.  Pager Numbers  - Dr. Kowalski: [phone]  - Dr. Moye: [phone]  - Dr. Stewart: [phone]  In the event of inclement weather, please call our main line at [phone] for an update on the status of any delays or closures.  Dermatology Medication Tips: Please keep the boxes that topical medications come in in order to help keep track of the instructions about where and how to use these. Pharmacies typically print the medication instructions only on the boxes and not directly on the medication tubes.   If your medication is too expensive, please contact our office at [phone] option 4 or send us a message through MyChart.   We are unable to tell what your co-pay for medications will be in advance as this is different depending on your insurance coverage. However, we may be able to find a substitute medication at lower cost or fill out paperwork to get insurance to cover a needed medication.   If a prior authorization is required to get your medication covered by your insurance company, please allow us 1-2 business days to complete this process.  Drug prices often vary depending on where the prescription is filled and some pharmacies   may offer cheaper prices.  The website www.goodrx.com contains coupons for medications through different pharmacies. The prices here do not account for what the cost may be with help from insurance (it may be cheaper with your insurance), but the website can give you the price if you did not use any insurance.  - You can print the associated coupon and take it with your prescription to the pharmacy.  - You may also stop by our office during  regular business hours and pick up a GoodRx coupon card.  - If you need your prescription sent electronically to a different pharmacy, notify our office through [place] MyChart or by phone at [phone] option 4.     Si Usted Necesita Algo Despus de Su Visita  Tambin puede enviarnos un mensaje a travs de MyChart. Por lo general respondemos a los mensajes de MyChart en el transcurso de 1 a 2 das hbiles.  Para renovar recetas, por favor pida a su farmacia que se ponga en contacto con nuestra oficina. Nuestro nmero de fax es el [phone].  Si tiene un asunto urgente cuando la clnica est cerrada y que no puede esperar hasta el siguiente da hbil, puede llamar/localizar a su doctor(a) al nmero que aparece a continuacin.   Por favor, tenga en cuenta que aunque hacemos todo lo posible para estar disponibles para asuntos urgentes fuera del horario de oficina, no estamos disponibles las 24 horas del da, los 7 das de la semana.   Si tiene un problema urgente y no puede comunicarse con nosotros, puede optar por buscar atencin mdica  en el consultorio de su doctor(a), en una clnica privada, en un centro de atencin urgente o en una sala de emergencias.  Si tiene una emergencia mdica, por favor llame inmediatamente al 911 o vaya a la sala de emergencias.  Nmeros de bper  - Dr. Kowalski: [phone]  - Dra. Moye: [phone]  - Dra. Stewart: [phone]  En caso de inclemencias del tiempo, por favor llame a nuestra lnea principal al [phone] para una actualizacin sobre el estado de cualquier retraso o cierre.  Consejos para la medicacin en dermatologa: Por favor, guarde las cajas en las que vienen los medicamentos de uso tpico para ayudarle a seguir las instrucciones sobre dnde y cmo usarlos. Las farmacias generalmente imprimen las instrucciones del medicamento slo en las cajas y no directamente en los tubos del medicamento.   Si su medicamento es muy  caro, por favor, pngase en contacto con nuestra oficina llamando al [phone] y presione la opcin 4 o envenos un mensaje a travs de MyChart.   No podemos decirle cul ser su copago por los medicamentos por adelantado ya que esto es diferente dependiendo de la cobertura de su seguro. Sin embargo, es posible que podamos encontrar un medicamento sustituto a menor costo o llenar un formulario para que el seguro cubra el medicamento que se considera necesario.   Si se requiere una autorizacin previa para que su compaa de seguros cubra su medicamento, por favor permtanos de 1 a 2 das hbiles para completar este proceso.  Los precios de los medicamentos varan con frecuencia dependiendo del lugar de dnde se surte la receta y alguna farmacias pueden ofrecer precios ms baratos.  El sitio web www.goodrx.com tiene cupones para medicamentos de diferentes farmacias. Los precios aqu no tienen en cuenta lo que podra costar con la ayuda del seguro (puede ser ms barato con su seguro), pero el sitio web puede darle el precio si no   utiliz ningn seguro.  - Puede imprimir el cupn correspondiente y llevarlo con su receta a la farmacia.  - Tambin puede pasar por nuestra oficina durante el horario de atencin regular y recoger una tarjeta de cupones de GoodRx.  - Si necesita que su receta se enve electrnicamente a una farmacia diferente, informe a nuestra oficina a travs de MyChart de [place] o por telfono llamando al [phone] y presione la opcin 4.  

## 2021-11-26 NOTE — Progress Notes (Signed)
   Follow-Up Visit   Subjective  Linda Henson is a 66 y.o. female who presents for the following: Follow-up (Patient reports plaquinel was causing blood in stool along with diarrhea and has discontinued taking. ).  She stopped the med and GI issues improved, then restarted it and they returned, so she has discontinued taking plaquenil. Stopped 2 weeks ago, Vaginal area flared but using clobetasol twice daily with some improvement.   The following portions of the chart were reviewed this encounter and updated as appropriate:      Review of Systems: No other skin or systemic complaints except as noted in HPI or Assessment and Plan.   Objective  Well appearing patient in no apparent distress; mood and affect are within normal limits.  A focused examination was performed including groin. Relevant physical exam findings are noted in the Assessment and Plan.  vaginal area Agglutination of labia minora.  Erythema and mild atrophy at L labia minora.  Hyperpigmented macules on right labia minora and superior L labia minora   Assessment & Plan  Lichen sclerosus et atrophicus vaginal area  Bx proven LS&A- Chronic and persistent condition with duration or expected duration over one year. Not currently at goal. Pt has had mild flare since she discontinued plaquenil due to GI side effects.   Lichen sclerosus is a chronic inflammatory condition of unknown cause that frequently involves the vaginal area and less commonly extragenital skin, and is NOT sexually transmitted. It frequently causes symptoms of pain and burning.  It requires regular monitoring and treatment with topical steroids to minimize inflammation and to reduce risk of scarring. There is also a risk of cancer in the vaginal area which is very low if inflammation is well controlled. Regular checks of the area are recommended. Please call if you notice any new or changing spots within this area.     D/c Plaquenil 200 mg tablet - no  need for labs   Continue clobetasol twice daily to vaginal area, prn itching, pain, burning. Can back down to once daily or less as able to control symptoms.  Avoid inguinal creases or perirectal area due to risk skin atrophy.  clobetasol ointment (TEMOVATE) 0.05 % - vaginal area Apply 1 Application topically 2 (two) times daily. Apply to aa's BID PRN flares.  Related Medications hydroxychloroquine (PLAQUENIL) 200 MG tablet TAKE 1 TABLET(200 MG) BY MOUTH TWICE DAILY   Return for 3 - 4 month L S & A with Dr. Laurence Ferrari . I, Ruthell Rummage, CMA, am acting as scribe for Brendolyn Patty, MD.  Documentation: I have reviewed the above documentation for accuracy and completeness, and I agree with the above.  Brendolyn Patty MD

## 2021-12-04 ENCOUNTER — Other Ambulatory Visit: Payer: Self-pay | Admitting: Family Medicine

## 2021-12-04 DIAGNOSIS — I10 Essential (primary) hypertension: Secondary | ICD-10-CM

## 2022-01-02 DIAGNOSIS — G4733 Obstructive sleep apnea (adult) (pediatric): Secondary | ICD-10-CM | POA: Diagnosis not present

## 2022-01-17 ENCOUNTER — Other Ambulatory Visit: Payer: Self-pay | Admitting: Dermatology

## 2022-01-17 DIAGNOSIS — L900 Lichen sclerosus et atrophicus: Secondary | ICD-10-CM

## 2022-02-17 ENCOUNTER — Ambulatory Visit (INDEPENDENT_AMBULATORY_CARE_PROVIDER_SITE_OTHER): Payer: BC Managed Care – PPO | Admitting: Family Medicine

## 2022-02-17 ENCOUNTER — Encounter: Payer: Self-pay | Admitting: Family Medicine

## 2022-02-17 VITALS — BP 122/84 | HR 53 | Temp 97.8°F | Ht 65.0 in | Wt 214.2 lb

## 2022-02-17 DIAGNOSIS — Z6835 Body mass index (BMI) 35.0-35.9, adult: Secondary | ICD-10-CM

## 2022-02-17 DIAGNOSIS — F32A Depression, unspecified: Secondary | ICD-10-CM | POA: Diagnosis not present

## 2022-02-17 DIAGNOSIS — G479 Sleep disorder, unspecified: Secondary | ICD-10-CM | POA: Diagnosis not present

## 2022-02-17 DIAGNOSIS — E6609 Other obesity due to excess calories: Secondary | ICD-10-CM

## 2022-02-17 DIAGNOSIS — Z1231 Encounter for screening mammogram for malignant neoplasm of breast: Secondary | ICD-10-CM

## 2022-02-17 DIAGNOSIS — N3946 Mixed incontinence: Secondary | ICD-10-CM

## 2022-02-17 DIAGNOSIS — M8580 Other specified disorders of bone density and structure, unspecified site: Secondary | ICD-10-CM | POA: Diagnosis not present

## 2022-02-17 DIAGNOSIS — Z0001 Encounter for general adult medical examination with abnormal findings: Secondary | ICD-10-CM | POA: Diagnosis not present

## 2022-02-17 DIAGNOSIS — E785 Hyperlipidemia, unspecified: Secondary | ICD-10-CM | POA: Diagnosis not present

## 2022-02-17 DIAGNOSIS — F419 Anxiety disorder, unspecified: Secondary | ICD-10-CM | POA: Diagnosis not present

## 2022-02-17 DIAGNOSIS — Z789 Other specified health status: Secondary | ICD-10-CM

## 2022-02-17 LAB — COMPREHENSIVE METABOLIC PANEL
ALT: 25 U/L (ref 0–35)
AST: 16 U/L (ref 0–37)
Albumin: 4.4 g/dL (ref 3.5–5.2)
Alkaline Phosphatase: 49 U/L (ref 39–117)
BUN: 13 mg/dL (ref 6–23)
CO2: 26 mEq/L (ref 19–32)
Calcium: 9.2 mg/dL (ref 8.4–10.5)
Chloride: 104 mEq/L (ref 96–112)
Creatinine, Ser: 0.75 mg/dL (ref 0.40–1.20)
GFR: 82.7 mL/min (ref >60.00)
Glucose, Bld: 111 mg/dL — ABNORMAL HIGH (ref 70–99)
Potassium: 3.8 mEq/L (ref 3.5–5.1)
Sodium: 139 mEq/L (ref 135–145)
Total Bilirubin: 0.8 mg/dL (ref 0.2–1.2)
Total Protein: 6.9 g/dL (ref 6.0–8.3)

## 2022-02-17 LAB — LIPID PANEL
Cholesterol: 126 mg/dL (ref 0–200)
HDL: 58.9 mg/dL (ref >39.00)
LDL Cholesterol: 42 mg/dL (ref 0–99)
NonHDL: 66.74
Total CHOL/HDL Ratio: 2
Triglycerides: 123 mg/dL (ref 0.0–149.0)
VLDL: 24.6 mg/dL (ref 0.0–40.0)

## 2022-02-17 LAB — HEMOGLOBIN A1C: Hgb A1c MFr Bld: 6.5 % (ref 4.6–6.5)

## 2022-02-17 MED ORDER — DULOXETINE HCL 30 MG PO CPEP: ORAL_CAPSULE | ORAL | 2 refills | Status: DC

## 2022-02-17 MED ORDER — TRAZODONE HCL 50 MG PO TABS: 25.0000 mg | ORAL_TABLET | Freq: Every evening | ORAL | 3 refills | Status: DC | PRN

## 2022-02-17 NOTE — Assessment & Plan Note (Addendum)
Patient reports only sleeping for around 4-5 hours before tossing and turning in bed for the rest of the night. Will try trazodone 25-'50mg'$  before bedtime.  She will let me know if she is excessively drowsy the next morning.

## 2022-02-17 NOTE — Progress Notes (Signed)
Patient seen along with medical student Zada Zhong.  I personally evaluated this patient along with the student, and verified all aspects of the history, physical exam, and medical decision making as documented by the student.  I agree with the student's documentation and have made all necessary edits.  Aeralyn Barna, MD  

## 2022-02-17 NOTE — Assessment & Plan Note (Signed)
Patient states this is chronic and symptoms have not worsened.
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
## 2022-02-17 NOTE — Assessment & Plan Note (Signed)
Physical exam completed.  I encouraged healthy diet and exercise.  She will start Silver sneakers once she gets on Medicare.  She will call to schedule her mammogram and bone density scan for January.  Colonoscopy is up-to-date.  Vaccines are up-to-date.  Discussed reduction of alcohol intake to no more than 1 standard drink a day given caloric intake and risk to her liver and health.  Lab work as outlined.

## 2022-02-17 NOTE — Assessment & Plan Note (Signed)
>>  ASSESSMENT AND PLAN FOR ANXIETY AND DEPRESSION WRITTEN ON 02/17/2022 10:42 AM BY SONNENBERG, ERIC G, MD  Patient agreeable to trying Cymbalta  30mg  daily for two weeks and then she will increase to 60mg  daily.  Discussed risk of weight gain, sexual dysfunction, sleep issues with the Cymbalta .  She will contact us  if she develops those or any other side effects.  I will also refer for therapy.

## 2022-02-17 NOTE — Assessment & Plan Note (Addendum)
Discussed with patient the importance of reducing her servings of bourbon to improve overall health and liver health. Patient will try to cut down on intake over time.

## 2022-02-17 NOTE — Patient Instructions (Signed)
Nice to see you. Please try to start Silver sneakers when you are able to.  You need to work on eating healthier diet as well.  Please try to get fruits and vegetables each day.  Please limit eating out. Please reduce your alcohol intake to no more than 1 drink a day. Please call [phone] to schedule your mammogram and bone density scans. A therapy office should call you to set up an appointment. If you notice any side effects with the Cymbalta or trazodone please let us know.

## 2022-02-17 NOTE — Assessment & Plan Note (Signed)
Encouraged healthy diet and adding in more exercise. Patient's insurance doesn't cover weight-loss medications currently so she will work on diet and exercise.

## 2022-02-17 NOTE — Assessment & Plan Note (Addendum)
Patient agreeable to trying Cymbalta '30mg'$  daily for two weeks and then she will increase to '60mg'$  daily.  Discussed risk of weight gain, sexual dysfunction, sleep issues with the Cymbalta.  She will contact us if she develops those or any other side effects.  I will also refer for therapy.

## 2022-02-26 ENCOUNTER — Ambulatory Visit: Payer: BC Managed Care – PPO | Admitting: Dermatology

## 2022-03-24 ENCOUNTER — Ambulatory Visit
Admission: RE | Admit: 2022-03-24 | Discharge: 2022-03-24 | Disposition: A | Discharge status: Home / Self Care | Payer: PPO | Source: Ambulatory Visit | Attending: Family Medicine | Admitting: Family Medicine

## 2022-03-24 ENCOUNTER — Other Ambulatory Visit: Payer: Self-pay | Admitting: Family Medicine

## 2022-03-24 DIAGNOSIS — R928 Other abnormal and inconclusive findings on diagnostic imaging of breast: Secondary | ICD-10-CM

## 2022-03-24 DIAGNOSIS — N63 Unspecified lump in unspecified breast: Secondary | ICD-10-CM

## 2022-03-24 DIAGNOSIS — Z1231 Encounter for screening mammogram for malignant neoplasm of breast: Secondary | ICD-10-CM | POA: Diagnosis not present

## 2022-03-25 ENCOUNTER — Ambulatory Visit: Payer: PPO | Admitting: Dermatology

## 2022-03-25 ENCOUNTER — Encounter: Payer: Self-pay | Admitting: Dermatology

## 2022-03-25 VITALS — BP 142/84 | HR 57

## 2022-03-25 DIAGNOSIS — L900 Lichen sclerosus et atrophicus: Secondary | ICD-10-CM

## 2022-03-25 MED ORDER — TRIAMCINOLONE ACETONIDE 0.1 % EX OINT: TOPICAL_OINTMENT | CUTANEOUS | 2 refills | Status: DC

## 2022-03-25 NOTE — Progress Notes (Signed)
   Follow-Up Visit   Subjective  Linda Henson is a 67 y.o. female who presents for the following: Follow-up (3-4 month recheck. LSetA. Vaginal area. Using Clobetasol ointment as directed/as needed. Stopped Plaquenil due to chronic diarrhea. States the condition is not bothering her. Patient states blood in stool has recurred. Had a colonoscopy, several polyps removed).  The following portions of the chart were reviewed this encounter and updated as appropriate:  Tobacco  Allergies  Meds  Problems  Med Hx  Surg Hx  Fam Hx      Review of Systems: No other skin or systemic complaints except as noted in HPI or Assessment and Plan.   Objective  Well appearing patient in no apparent distress; mood and affect are within normal limits.  A focused examination was performed including groin, vaginal area. Relevant physical exam findings are noted in the Assessment and Plan.  Pubic Hypopigmented patches   Assessment & Plan  Lichen sclerosus et atrophicus Pubic  Chronic condition with duration or expected duration over one year. Currently well-controlled.  Lichen sclerosus is a chronic inflammatory condition of unknown cause that frequently involves the vaginal area and less commonly extragenital skin, and is NOT sexually transmitted. It frequently causes symptoms of pain and burning.  It requires regular monitoring and treatment with topical steroids to minimize inflammation and to reduce risk of scarring. There is also a risk of cancer in the vaginal area which is very low if inflammation is well controlled. Regular checks of the area are recommended. Please call if you notice any new or changing spots within this area.  Start Triamcinolone 0.1% ointment twice a week.  Stop Clobetasol ointment at this time.  Opzelura sample given. Apply twice daily for flares.    Topical steroids (such as triamcinolone, fluocinolone, fluocinonide, mometasone, clobetasol, halobetasol,  betamethasone, hydrocortisone) can cause thinning and lightening of the skin if they are used for too long in the same area. Your physician has selected the right strength medicine for your problem and area affected on the body. Please use your medication only as directed by your physician to prevent side effects.    triamcinolone ointment (KENALOG) 0.1 % - Pubic Apply to affected areas twice a week  Related Medications clobetasol ointment (TEMOVATE) 5.02 % Apply 1 Application topically 2 (two) times daily. Apply to aa's BID PRN flares.   Return for LSetA recheck in 3-4 months.  I, Emelia Salisbury, CMA, am acting as scribe for Forest Gleason, MD.  Documentation: I have reviewed the above documentation for accuracy and completeness, and I agree with the above.  Forest Gleason, MD

## 2022-03-25 NOTE — Patient Instructions (Addendum)
Start Triamcinolone 0.1% ointment twice a week.  Stop Clobetasol ointment at this time.  Opzelura sample given. Apply twice daily for flares.    Topical steroids (such as triamcinolone, fluocinolone, fluocinonide, mometasone, clobetasol, halobetasol, betamethasone, hydrocortisone) can cause thinning and lightening of the skin if they are used for too long in the same area. Your physician has selected the right strength medicine for your problem and area affected on the body. Please use your medication only as directed by your physician to prevent side effects.    Due to recent changes in healthcare laws, you may see results of your pathology and/or laboratory studies on MyChart before the doctors have had a chance to review them. We understand that in some cases there may be results that are confusing or concerning to you. Please understand that not all results are received at the same time and often the doctors may need to interpret multiple results in order to provide you with the best plan of care or course of treatment. Therefore, we ask that you please give [place] 2 business days to thoroughly review all your results before contacting the office for clarification. Should we see a critical lab result, you will be contacted sooner.   If You Need Anything After Your Visit  If you have any questions or concerns for your doctor, please call our main line at [phone] and press option 4 to reach your doctor's medical assistant. If no one answers, please leave a voicemail as directed and we will return your call as soon as possible. Messages left after 4 pm will be answered the following business day.   You may also send [place] a message via [place]. We typically respond to MyChart messages within 1-2 business days.  For prescription refills, please ask your pharmacy to contact our office. Our fax number is [phone].  If you have an urgent issue when the clinic is closed that cannot wait until the  next business day, you can page your doctor at the number below.    Please note that while we do our best to be available for urgent issues outside of office hours, we are not available 24/7.   If you have an urgent issue and are unable to reach [place], you may choose to seek medical care at your doctor's office, retail clinic, urgent care center, or emergency room.  If you have a medical emergency, please immediately call 911 or go to the emergency department.  Pager Numbers  - Dr. Nehemiah Massed: [phone]  - Dr. Laurence Ferrari: [phone]  - Dr. Nicole Kindred: [phone]  In the event of inclement weather, please call our main line at [phone] for an update on the status of any delays or closures.  Dermatology Medication Tips: Please keep the boxes that topical medications come in in order to help keep track of the instructions about where and how to use these. Pharmacies typically print the medication instructions only on the boxes and not directly on the medication tubes.   If your medication is too expensive, please contact our office at [phone] option 4 or send [place] a message through [place].   We are unable to tell what your co-pay for medications will be in advance as this is different depending on your insurance coverage. However, we may be able to find a substitute medication at lower cost or fill out paperwork to get insurance to cover a needed medication.   If a prior authorization is required to get your medication covered by your insurance  company, please allow [place] 1-2 business days to complete this process.  Drug prices often vary depending on where the prescription is filled and some pharmacies may offer cheaper prices.  The website www.goodrx.com contains coupons for medications through different pharmacies. The prices here do not account for what the cost may be with help from insurance (it may be cheaper with your insurance), but the website can give you the price if you did not  use any insurance.  - You can print the associated coupon and take it with your prescription to the pharmacy.  - You may also stop by our office during regular business hours and pick up a GoodRx coupon card.  - If you need your prescription sent electronically to a different pharmacy, notify our office through [facility] or by phone at [phone] option 4.     Si Usted Necesita Algo Despus de Su Visita  Tambin puede enviarnos un mensaje a travs de Pharmacist, community. Por lo general respondemos a los mensajes de MyChart en el transcurso de 1 a 2 das hbiles.  Para renovar recetas, por favor pida a su farmacia que se ponga en contacto con nuestra oficina. Harland Dingwall de fax es [place] [phone].  Si tiene un asunto urgente cuando la clnica est cerrada y que no puede esperar hasta el siguiente da hbil, puede llamar/localizar a su doctor(a) al nmero que aparece a continuacin.   Por favor, tenga en cuenta que aunque hacemos todo lo posible para estar disponibles para asuntos urgentes fuera del horario de [place], no estamos disponibles las 24 horas del da, los 7 das de la [place].   Si tiene un problema urgente y no puede comunicarse con nosotros, puede optar por buscar atencin mdica  en el consultorio de su doctor(a), en una clnica privada, en un centro de atencin urgente o en una sala de emergencias.  Si tiene Engineering geologist, por favor llame inmediatamente al 911 o vaya a la sala de emergencias.  Nmeros de bper  - Dr. Nehemiah Massed: [phone]  - Dra. Moye: [phone]  - Dra. Nicole Kindred: [phone]  En caso de inclemencias del [place], por favor llame a Johnsie Kindred principal al [phone] para una actualizacin sobre el [place] de cualquier retraso o cierre.  Consejos para la medicacin en dermatologa: Por favor, guarde las cajas en las que vienen los medicamentos de uso tpico para ayudarle a seguir las instrucciones sobre dnde y cmo usarlos. Las farmacias  generalmente imprimen las instrucciones del medicamento slo en las cajas y no directamente en los tubos del [place].   Si su medicamento es muy caro, por favor, pngase en contacto con Zigmund Daniel llamando al [phone] y presione la opcin 4 o envenos un mensaje a travs de Pharmacist, community.   No podemos decirle cul ser su copago por los medicamentos por adelantado ya que esto es diferente dependiendo de la cobertura de su seguro. Sin embargo, es posible que podamos encontrar un medicamento sustituto a Electrical engineer un formulario para que el seguro cubra el medicamento que se considera necesario.   Si se requiere una autorizacin previa para que su compaa de seguros [place] su medicamento, por favor permtanos de 1 a 2 das hbiles para completar este proceso.  Los precios de los medicamentos varan con frecuencia dependiendo del Environmental consultant de dnde se surte la receta y alguna farmacias pueden ofrecer precios ms baratos.  El sitio web www.goodrx.com tiene cupones para medicamentos de Airline pilot. Los precios aqu no tienen en cuenta  lo que podra costar con la ayuda del seguro (puede ser ms barato con su seguro), pero el sitio web puede darle el precio si no utiliz ningn seguro.  - Puede imprimir el cupn correspondiente y llevarlo con su receta a la farmacia.  - Tambin puede pasar por nuestra oficina durante el horario de atencin regular y recoger una tarjeta de cupones de GoodRx.  - Si necesita que su receta se enve electrnicamente a una farmacia diferente, informe a nuestra oficina a travs de MyChart de [place] o por telfono llamando al [phone] y presione la opcin 4.  

## 2022-03-28 ENCOUNTER — Ambulatory Visit
Admission: RE | Admit: 2022-03-28 | Discharge: 2022-03-28 | Disposition: A | Discharge status: Home / Self Care | Payer: PPO | Source: Ambulatory Visit | Attending: Family Medicine | Admitting: Family Medicine

## 2022-03-28 DIAGNOSIS — N63 Unspecified lump in unspecified breast: Secondary | ICD-10-CM | POA: Insufficient documentation

## 2022-03-28 DIAGNOSIS — R928 Other abnormal and inconclusive findings on diagnostic imaging of breast: Secondary | ICD-10-CM | POA: Insufficient documentation

## 2022-03-28 DIAGNOSIS — R922 Inconclusive mammogram: Secondary | ICD-10-CM | POA: Diagnosis not present

## 2022-03-28 DIAGNOSIS — N6001 Solitary cyst of right breast: Secondary | ICD-10-CM | POA: Diagnosis not present

## 2022-04-04 ENCOUNTER — Encounter: Payer: Self-pay | Admitting: Dermatology

## 2022-04-07 ENCOUNTER — Telehealth: Payer: Self-pay | Admitting: Family Medicine

## 2022-04-07 ENCOUNTER — Encounter: Payer: Self-pay | Admitting: Family Medicine

## 2022-04-07 NOTE — Telephone Encounter (Signed)
Pt need a refill on pantoprazole sent to [facility]

## 2022-04-08 DIAGNOSIS — G4733 Obstructive sleep apnea (adult) (pediatric): Secondary | ICD-10-CM | POA: Diagnosis not present

## 2022-04-09 ENCOUNTER — Other Ambulatory Visit: Payer: Self-pay

## 2022-04-09 MED ORDER — PANTOPRAZOLE SODIUM 40 MG PO TBEC: 40.0000 mg | DELAYED_RELEASE_TABLET | Freq: Every day | ORAL | 1 refills | Status: DC

## 2022-04-09 NOTE — Telephone Encounter (Signed)
sent 

## 2022-04-18 ENCOUNTER — Other Ambulatory Visit: Payer: Self-pay

## 2022-04-18 DIAGNOSIS — E785 Hyperlipidemia, unspecified: Secondary | ICD-10-CM

## 2022-04-18 MED ORDER — ROSUVASTATIN CALCIUM 20 MG PO TABS: ORAL_TABLET | ORAL | 3 refills | Status: DC

## 2022-04-21 ENCOUNTER — Ambulatory Visit: Payer: BC Managed Care – PPO | Admitting: Family Medicine

## 2022-04-28 ENCOUNTER — Ambulatory Visit
Admission: RE | Admit: 2022-04-28 | Discharge: 2022-04-28 | Disposition: A | Discharge status: Home / Self Care | Payer: PPO | Source: Ambulatory Visit | Attending: Family Medicine | Admitting: Family Medicine

## 2022-04-28 DIAGNOSIS — M8589 Other specified disorders of bone density and structure, multiple sites: Secondary | ICD-10-CM | POA: Diagnosis not present

## 2022-04-28 DIAGNOSIS — M8580 Other specified disorders of bone density and structure, unspecified site: Secondary | ICD-10-CM | POA: Diagnosis present

## 2022-04-28 DIAGNOSIS — M85851 Other specified disorders of bone density and structure, right thigh: Secondary | ICD-10-CM | POA: Diagnosis not present

## 2022-04-28 DIAGNOSIS — Z780 Asymptomatic menopausal state: Secondary | ICD-10-CM | POA: Diagnosis not present

## 2022-05-26 ENCOUNTER — Encounter: Payer: Self-pay | Admitting: Family Medicine

## 2022-05-26 ENCOUNTER — Ambulatory Visit (INDEPENDENT_AMBULATORY_CARE_PROVIDER_SITE_OTHER): Payer: PPO | Admitting: Family Medicine

## 2022-05-26 VITALS — BP 124/82 | HR 85 | Temp 97.9°F | Ht 65.0 in | Wt 206.6 lb

## 2022-05-26 DIAGNOSIS — G629 Polyneuropathy, unspecified: Secondary | ICD-10-CM

## 2022-05-26 DIAGNOSIS — F32A Depression, unspecified: Secondary | ICD-10-CM

## 2022-05-26 DIAGNOSIS — Z789 Other specified health status: Secondary | ICD-10-CM

## 2022-05-26 DIAGNOSIS — M79645 Pain in left finger(s): Secondary | ICD-10-CM | POA: Diagnosis not present

## 2022-05-26 DIAGNOSIS — F419 Anxiety disorder, unspecified: Secondary | ICD-10-CM

## 2022-05-26 DIAGNOSIS — M791 Myalgia, unspecified site: Secondary | ICD-10-CM

## 2022-05-26 HISTORY — DX: Myalgia, unspecified site: M79.10

## 2022-05-26 LAB — COMPREHENSIVE METABOLIC PANEL
ALT: 21 U/L (ref 0–35)
AST: 17 U/L (ref 0–37)
Albumin: 4.2 g/dL (ref 3.5–5.2)
Alkaline Phosphatase: 56 U/L (ref 39–117)
BUN: 11 mg/dL (ref 6–23)
CO2: 25 mEq/L (ref 19–32)
Calcium: 9.6 mg/dL (ref 8.4–10.5)
Chloride: 101 mEq/L (ref 96–112)
Creatinine, Ser: 0.83 mg/dL (ref 0.40–1.20)
GFR: 73.09 mL/min (ref >60.00)
Glucose, Bld: 126 mg/dL — ABNORMAL HIGH (ref 70–99)
Potassium: 3.8 mEq/L (ref 3.5–5.1)
Sodium: 137 mEq/L (ref 135–145)
Total Bilirubin: 1 mg/dL (ref 0.2–1.2)
Total Protein: 7.2 g/dL (ref 6.0–8.3)

## 2022-05-26 LAB — VITAMIN B12: Vitamin B-12: 211 pg/mL (ref 211–911)

## 2022-05-26 LAB — CK: Total CK: 98 U/L (ref 7–177)

## 2022-05-26 LAB — HEMOGLOBIN A1C: Hgb A1c MFr Bld: 6.5 % (ref 4.6–6.5)

## 2022-05-26 LAB — TSH: TSH: 1.36 u[IU]/mL (ref 0.35–5.50)

## 2022-05-26 NOTE — Assessment & Plan Note (Addendum)
Likely strain.  Discussed icing.  Advise she could use Aleve over-the-counter 1 to 2 tablets twice daily.  Discussed the 2 tablets is close to the prescription dose.  Advised to take this with food.

## 2022-05-26 NOTE — Assessment & Plan Note (Addendum)
Chronic issue.  I encouraged her to continue to work on cutting back on her alcohol intake for her overall health and liver health.  Congratulated patient on cutting back to where she has at this point.

## 2022-05-26 NOTE — Assessment & Plan Note (Signed)
>>  ASSESSMENT AND PLAN FOR ANXIETY AND DEPRESSION WRITTEN ON 05/26/2022 12:03 PM BY MARIBETH, ERIC G, MD  Chronic issue.  Has improved some.  She will monitor.  She can use the trazodone  25-50 mg nightly as needed for sleep.

## 2022-05-26 NOTE — Progress Notes (Signed)
Tommi Rumps, MD Phone: [phone]  Linda Henson is a 67 y.o. female who presents today for f/u.  Anxiety/depression: Patient notes these things are okay.  They have gotten better.  She put her dog to sleep and got a new dog.  She is at times getting along okay with her daughter.  She did try the Cymbalta though had a skin crawling sensation so she discontinued it.  No SI or HI.  She has not required the trazodone recently.  Alcohol abuse: Patient has cut down.  She is having 1 glass of alcohol daily.  She does not measure it out.  Previously she was drinking more than this.  Left thoracic back pain: Patient notes in January she had a month of intermittent pinching sensation in her left thoracic back.  It did eventually radiate down to her left side.  She noted no injury.  It has resolved.  Neuropathy/myalgias: Patient notes for the last couple of months she wakes up in the morning and her legs hurt as if somebody has beaten her.  She also has numbness and tingling in her legs and her arms.  She notes all of these things improve when she gets up and moves around.  They mostly occur at night.  Left thumb pain: Patient notes this started to hurt more over the weekend.  It is over the metacarpal bone.  No injury.  She did note she moved furniture over the weekend.  She noted no specific injury.  Social History   Tobacco Use  Smoking Status Former   Years: 4.00   Types: Cigarettes   Quit date: 12/15/1992   Years since quitting: 29.4  Smokeless Tobacco Never    Current Outpatient Medications on File Prior to Visit  Medication Sig Dispense Refill   albuterol (VENTOLIN HFA) 108 (90 Base) MCG/ACT inhaler Inhale 2 puffs into the lungs every 4 (four) hours as needed for wheezing or shortness of breath. 18 g 0   clobetasol ointment (TEMOVATE) [id number] % Apply 1 Application topically 2 (two) times daily. Apply to aa's BID PRN flares. 60 g 1   guaiFENesin-codeine 100-10 MG/5ML syrup Take 5  mLs by mouth 3 (three) times daily as needed for cough. 120 mL 0   pantoprazole (PROTONIX) 40 MG tablet Take 1 tablet (40 mg total) by mouth daily. 90 tablet 1   rosuvastatin (CRESTOR) 20 MG tablet TAKE 1 TABLET(20 MG) BY MOUTH DAILY 90 tablet 3   traZODone (DESYREL) 50 MG tablet Take 0.5-1 tablets (25-50 mg total) by mouth at bedtime as needed for sleep. 30 tablet 3   triamcinolone ointment (KENALOG) 0.1 % Apply to affected areas twice a week 80 g 2   valsartan (DIOVAN) 160 MG tablet TAKE 1 TABLET(160 MG) BY MOUTH DAILY 90 tablet 3   No current facility-administered medications on file prior to visit.     ROS see history of present illness  Objective  Physical Exam Vitals:   05/26/22 1140 05/26/22 1155  BP: 138/84 124/82  Pulse: 85   Temp: 97.9 F (36.6 C)   SpO2: 97%     BP Readings from Last 3 Encounters:  05/26/22 124/82  03/25/22 (!) 142/84  02/17/22 122/84   Wt Readings from Last 3 Encounters:  05/26/22 206 lb 9.6 oz (93.7 kg)  02/17/22 214 lb 3.2 oz (97.2 kg)  10/14/21 210 lb (95.3 kg)    Physical Exam Constitutional:      General: She is not in acute distress.  Appearance: She is not diaphoretic.  Cardiovascular:     Rate and Rhythm: Normal rate and regular rhythm.     Heart sounds: Normal heart sounds.  Pulmonary:     Effort: Pulmonary effort is normal.     Breath sounds: Normal breath sounds.  Musculoskeletal:     Comments: No midline spine tenderness, no midline spine step-off, no muscular back tenderness, there is some tenderness over the soft tissues overlying the left thumb metacarpal bone dorsally, muscles in her upper and lower arms and upper and lower legs are sore to touch  Skin:    General: Skin is warm and dry.  Neurological:     Mental Status: She is alert.     Comments: 5/5 strength in bilateral biceps, triceps, grip, quads, hamstrings, plantar and dorsiflexion, sensation to light touch intact in bilateral UE and LE       Assessment/Plan: Please see individual problem list.  Anxiety and depression Assessment & Plan: Chronic issue.  Has improved some.  She will monitor.  She can use the trazodone 25-50 mg nightly as needed for sleep.   Alcohol use Assessment & Plan: Chronic issue.  I encouraged her to continue to work on cutting back on her alcohol intake for her overall health and liver health.  Congratulated patient on cutting back to where she has at this point.   Myalgia Assessment & Plan: Certainly myalgias could be related to her statin.  I am going to have her stop her statin for 2 weeks and let us know how she is doing.  We will check lab work to evaluate for other underlying causes.  Orders: -     CK -     TSH -     Comprehensive metabolic panel  Neuropathy Assessment & Plan: Recent onset issue.  Will check lab work to evaluate for an underlying cause.  We will see if this improves with stopping the statin.  Orders: -     Vitamin B12 -     Hemoglobin A1c  Thumb pain, left Assessment & Plan: Likely strain.  Discussed icing.  Advise she could use Aleve over-the-counter 1 to 2 tablets twice daily.  Discussed the 2 tablets is close to the prescription dose.  Advised to take this with food.    Return in about 3 months (around 08/26/2022).   Tommi Rumps, MD [place]

## 2022-05-26 NOTE — Assessment & Plan Note (Signed)
Certainly myalgias could be related to her statin.  I am going to have her stop her statin for 2 weeks and let us know how she is doing.  We will check lab work to evaluate for other underlying causes.

## 2022-05-26 NOTE — Assessment & Plan Note (Signed)
Recent onset issue.  Will check lab work to evaluate for an underlying cause.  We will see if this improves with stopping the statin.

## 2022-05-26 NOTE — Assessment & Plan Note (Addendum)
Chronic issue.  Has improved some.  She will monitor.  She can use the trazodone 25-50 mg nightly as needed for sleep.

## 2022-05-26 NOTE — Patient Instructions (Signed)
Nice to see you. We will get lab work today. Please try stopping her statin for 2 weeks and see if your symptoms improve.  Please send me a message in 2 weeks to let me know how this has gone.

## 2022-05-28 ENCOUNTER — Encounter: Payer: Self-pay | Admitting: Family Medicine

## 2022-05-28 DIAGNOSIS — E785 Hyperlipidemia, unspecified: Secondary | ICD-10-CM

## 2022-05-29 ENCOUNTER — Ambulatory Visit (INDEPENDENT_AMBULATORY_CARE_PROVIDER_SITE_OTHER): Payer: PPO

## 2022-05-29 DIAGNOSIS — E538 Deficiency of other specified B group vitamins: Secondary | ICD-10-CM

## 2022-05-29 MED ORDER — CYANOCOBALAMIN 1000 MCG/ML IJ SOLN
1000.0000 ug | Freq: Once | INTRAMUSCULAR | Status: AC
  Administered 2022-05-29: 1000 ug via INTRAMUSCULAR

## 2022-05-29 NOTE — Progress Notes (Signed)
Patient presented for B 12 injection to left deltoid, patient voiced no concerns nor showed any signs of distress during injection. Linda Henson,cma  

## 2022-06-05 DIAGNOSIS — E1136 Type 2 diabetes mellitus with diabetic cataract: Secondary | ICD-10-CM | POA: Diagnosis not present

## 2022-06-05 DIAGNOSIS — I10 Essential (primary) hypertension: Secondary | ICD-10-CM | POA: Diagnosis not present

## 2022-06-05 DIAGNOSIS — F1020 Alcohol dependence, uncomplicated: Secondary | ICD-10-CM | POA: Diagnosis not present

## 2022-06-05 DIAGNOSIS — J449 Chronic obstructive pulmonary disease, unspecified: Secondary | ICD-10-CM | POA: Diagnosis not present

## 2022-06-05 DIAGNOSIS — M1990 Unspecified osteoarthritis, unspecified site: Secondary | ICD-10-CM | POA: Diagnosis not present

## 2022-06-05 DIAGNOSIS — M8580 Other specified disorders of bone density and structure, unspecified site: Secondary | ICD-10-CM | POA: Diagnosis not present

## 2022-06-05 DIAGNOSIS — E538 Deficiency of other specified B group vitamins: Secondary | ICD-10-CM | POA: Diagnosis not present

## 2022-06-05 DIAGNOSIS — H259 Unspecified age-related cataract: Secondary | ICD-10-CM | POA: Diagnosis not present

## 2022-06-05 DIAGNOSIS — K219 Gastro-esophageal reflux disease without esophagitis: Secondary | ICD-10-CM | POA: Diagnosis not present

## 2022-06-05 DIAGNOSIS — G63 Polyneuropathy in diseases classified elsewhere: Secondary | ICD-10-CM | POA: Diagnosis not present

## 2022-06-05 DIAGNOSIS — G4733 Obstructive sleep apnea (adult) (pediatric): Secondary | ICD-10-CM | POA: Diagnosis not present

## 2022-06-05 DIAGNOSIS — E669 Obesity, unspecified: Secondary | ICD-10-CM | POA: Diagnosis not present

## 2022-06-09 ENCOUNTER — Ambulatory Visit (INDEPENDENT_AMBULATORY_CARE_PROVIDER_SITE_OTHER): Payer: PPO

## 2022-06-09 DIAGNOSIS — E538 Deficiency of other specified B group vitamins: Secondary | ICD-10-CM | POA: Diagnosis not present

## 2022-06-09 MED ORDER — CYANOCOBALAMIN 1000 MCG/ML IJ SOLN
1000.0000 ug | Freq: Once | INTRAMUSCULAR | Status: AC
  Administered 2022-06-09: 1000 ug via INTRAMUSCULAR

## 2022-06-09 NOTE — Progress Notes (Signed)
presents today for injection per MD orders. B12 injection administered IM in left Upper Arm. Administration without incident. Patient tolerated well.  Olisa Quesnel,cma   

## 2022-06-10 MED ORDER — PRAVASTATIN SODIUM 20 MG PO TABS: 20.0000 mg | ORAL_TABLET | Freq: Every day | ORAL | 1 refills | Status: DC

## 2022-06-10 NOTE — Telephone Encounter (Signed)
Patient is scheduled for 07/22/22 at 8:00

## 2022-06-16 ENCOUNTER — Ambulatory Visit (INDEPENDENT_AMBULATORY_CARE_PROVIDER_SITE_OTHER): Payer: PPO

## 2022-06-16 DIAGNOSIS — E538 Deficiency of other specified B group vitamins: Secondary | ICD-10-CM | POA: Diagnosis not present

## 2022-06-16 MED ORDER — CYANOCOBALAMIN 1000 MCG/ML IJ SOLN
1000.0000 ug | Freq: Once | INTRAMUSCULAR | Status: AC
  Administered 2022-06-16: 1000 ug via INTRAMUSCULAR

## 2022-06-16 NOTE — Progress Notes (Signed)
Pt presented for their vitamin B12 injection. Pt was identified through two identifiers. Pt tolerated shot well in their right deltoid.  

## 2022-06-22 ENCOUNTER — Other Ambulatory Visit: Payer: Self-pay | Admitting: Family Medicine

## 2022-06-22 DIAGNOSIS — F419 Anxiety disorder, unspecified: Secondary | ICD-10-CM

## 2022-07-02 ENCOUNTER — Encounter: Payer: Self-pay | Admitting: Dermatology

## 2022-07-02 ENCOUNTER — Ambulatory Visit (INDEPENDENT_AMBULATORY_CARE_PROVIDER_SITE_OTHER): Payer: PPO | Admitting: Dermatology

## 2022-07-02 VITALS — BP 124/82

## 2022-07-02 DIAGNOSIS — L900 Lichen sclerosus et atrophicus: Secondary | ICD-10-CM | POA: Diagnosis not present

## 2022-07-02 NOTE — Progress Notes (Signed)
   Follow-Up Visit   Subjective  Linda Henson is a 67 y.o. female who presents for the following: 3 month lichen sclerosus follow up. Patient not currently using any treatment, noticed some burning last week. She has used clobetasol, triamcinolone ointment and a sample of Opzelura.   The following portions of the chart were reviewed this encounter and updated as appropriate: medications, allergies, medical history  Review of Systems:  No other skin or systemic complaints except as noted in HPI or Assessment and Plan.  Objective  Well appearing patient in no apparent distress; mood and affect are within normal limits.   A focused examination was performed of the following areas: Vaginal, perianal  Relevant exam findings are noted in the Assessment and Plan.    Assessment & Plan   LICHEN SCLEROSUS ET ATROPHICUS Exam: erythematous and white patches, extending perianal  Chronic and persistent condition with duration or expected duration over one year. Condition is bothersome/symptomatic for patient. Currently flared.  Lichen sclerosus is a chronic inflammatory condition of unknown cause that frequently involves the vaginal area and less commonly extragenital skin, and is NOT sexually transmitted. It frequently causes symptoms of pain and burning.  It requires regular monitoring and treatment with topical steroids to minimize inflammation and to reduce risk of scarring. There is also a risk of cancer in the vaginal area which is very low if inflammation is well controlled. Regular checks of the area are recommended. Please call if you notice any new or changing spots within this area.  Treatment Plan: Restart clobetasol ointment twice daily x 1 week or until asymptomatic then use weekends only for maintenance. Stressed importance of maintenance treatment to keep inflammation calmed down and lower risk of scarring and cancer. If she is well-controlled at f/u can consider twice a month  maintenance.   She does also have a sample of opzelura at home she could use as needed for flares and triamcinolone ointment at home she could potentially use for maintenance in future.   Return in about 3 months (around 10/01/2022) for lichen sclerosus.  Anise Salvo, RMA, am acting as scribe for Darden Dates, MD .   Documentation: I have reviewed the above documentation for accuracy and completeness, and I agree with the above.  Darden Dates, MD

## 2022-07-02 NOTE — Patient Instructions (Signed)
Restart clobetasol ointment twice daily x 1 week then weekends only.  Due to recent changes in healthcare laws, you may see results of your pathology and/or laboratory studies on MyChart before the doctors have had a chance to review them. We understand that in some cases there may be results that are confusing or concerning to you. Please understand that not all results are received at the same time and often the doctors may need to interpret multiple results in order to provide you with the best plan of care or course of treatment. Therefore, we ask that you please give [place] 2 business days to thoroughly review all your results before contacting the office for clarification. Should we see a critical lab result, you will be contacted sooner.   If You Need Anything After Your Visit  If you have any questions or concerns for your doctor, please call our main line at [phone] and press option 4 to reach your doctor's medical assistant. If no one answers, please leave a voicemail as directed and we will return your call as soon as possible. Messages left after 4 pm will be answered the following business day.   You may also send [place] a message via MyChart. We typically respond to MyChart messages within 1-2 business days.  For prescription refills, please ask your pharmacy to contact our office. Our fax number is [phone].  If you have an urgent issue when the clinic is closed that cannot wait until the next business day, you can page your doctor at the number below.    Please note that while we do our best to be available for urgent issues outside of office hours, we are not available 24/7.   If you have an urgent issue and are unable to reach [place], you may choose to seek medical care at your doctor's office, retail clinic, urgent care center, or emergency room.  If you have a medical emergency, please immediately call 911 or go to the emergency department.  Pager Numbers  - Dr. Gwen Pounds:  [phone]  - Dr. Neale Burly: [phone]  - Dr. Roseanne Reno: [phone]  In the event of inclement weather, please call our main line at [phone] for an update on the status of any delays or closures.  Dermatology Medication Tips: Please keep the boxes that topical medications come in in order to help keep track of the instructions about where and how to use these. Pharmacies typically print the medication instructions only on the boxes and not directly on the medication tubes.   If your medication is too expensive, please contact our office at [phone] option 4 or send [place] a message through MyChart.   We are unable to tell what your co-pay for medications will be in advance as this is different depending on your insurance coverage. However, we may be able to find a substitute medication at lower cost or fill out paperwork to get insurance to cover a needed medication.   If a prior authorization is required to get your medication covered by your insurance company, please allow [place] 1-2 business days to complete this process.  Drug prices often vary depending on where the prescription is filled and some pharmacies may offer cheaper prices.  The website www.goodrx.com contains coupons for medications through different pharmacies. The prices here do not account for what the cost may be with help from insurance (it may be cheaper with your insurance), but the website can give you the price if you did not use any insurance.  -  You can print the associated coupon and take it with your prescription to the pharmacy.  - You may also stop by our office during regular business hours and pick up a GoodRx coupon card.  - If you need your prescription sent electronically to a different pharmacy, notify our office through [facility] or by phone at [phone] option 4.     Si Usted Necesita Algo Despus de Su Visita  Tambin puede enviarnos un mensaje a travs de Pharmacist, community. Por lo general  respondemos a los mensajes de MyChart en el transcurso de 1 a 2 das hbiles.  Para renovar recetas, por favor pida a su farmacia que se ponga en contacto con nuestra oficina. Harland Dingwall de fax es [place] [phone].  Si tiene un asunto urgente cuando la clnica est cerrada y que no puede esperar hasta el siguiente da hbil, puede llamar/localizar a su doctor(a) al nmero que aparece a continuacin.   Por favor, tenga en cuenta que aunque hacemos todo lo posible para estar disponibles para asuntos urgentes fuera del horario de [place], no estamos disponibles las 24 horas del da, los 7 das de la [place].   Si tiene un problema urgente y no puede comunicarse con nosotros, puede optar por buscar atencin mdica  en el consultorio de su doctor(a), en una clnica privada, en un centro de atencin urgente o en una sala de emergencias.  Si tiene Engineering geologist, por favor llame inmediatamente al 911 o vaya a la sala de emergencias.  Nmeros de bper  - Dr. Nehemiah Massed: [phone]  - Dra. Moye: [phone]  - Dra. Nicole Kindred: [phone]  En caso de inclemencias del [place], por favor llame a Johnsie Kindred principal al [phone] para una actualizacin sobre el [place] de cualquier retraso o cierre.  Consejos para la medicacin en dermatologa: Por favor, guarde las cajas en las que vienen los medicamentos de uso tpico para ayudarle a seguir las instrucciones sobre dnde y cmo usarlos. Las farmacias generalmente imprimen las instrucciones del medicamento slo en las cajas y no directamente en los tubos del [place].   Si su medicamento es muy caro, por favor, pngase en contacto con Zigmund Daniel llamando al [phone] y presione la opcin 4 o envenos un mensaje a travs de Pharmacist, community.   No podemos decirle cul ser su copago por los medicamentos por adelantado ya que esto es diferente dependiendo de la cobertura de su seguro. Sin embargo, es posible que podamos encontrar un  medicamento sustituto a Electrical engineer un formulario para que el seguro cubra el medicamento que se considera necesario.   Si se requiere una autorizacin previa para que su compaa de seguros [place] su medicamento, por favor permtanos de 1 a 2 das hbiles para completar este proceso.  Los precios de los medicamentos varan con frecuencia dependiendo del Environmental consultant de dnde se surte la receta y alguna farmacias pueden ofrecer precios ms baratos.  El sitio web www.goodrx.com tiene cupones para medicamentos de Airline pilot. Los precios aqu no tienen en cuenta lo que podra costar con la ayuda del seguro (puede ser ms barato con su seguro), pero el sitio web puede darle el precio si no utiliz Research scientist (physical sciences).  - Puede imprimir el cupn correspondiente y llevarlo con su receta a la farmacia.  - Tambin puede pasar por nuestra oficina durante el horario de atencin regular y Charity fundraiser una tarjeta de cupones de GoodRx.  - Si necesita que su receta se enve electrnicamente a una farmacia diferente, informe  a nuestra oficina a travs de MyChart de [place] o por telfono llamando al [phone] y presione la opcin 4.

## 2022-07-08 DIAGNOSIS — G4733 Obstructive sleep apnea (adult) (pediatric): Secondary | ICD-10-CM | POA: Diagnosis not present

## 2022-07-14 ENCOUNTER — Encounter: Payer: Self-pay | Admitting: Family Medicine

## 2022-07-22 ENCOUNTER — Other Ambulatory Visit: Payer: PPO

## 2022-07-28 ENCOUNTER — Ambulatory Visit (INDEPENDENT_AMBULATORY_CARE_PROVIDER_SITE_OTHER): Payer: PPO

## 2022-07-28 DIAGNOSIS — E538 Deficiency of other specified B group vitamins: Secondary | ICD-10-CM

## 2022-07-28 MED ORDER — CYANOCOBALAMIN 1000 MCG/ML IJ SOLN
1000.0000 ug | Freq: Once | INTRAMUSCULAR | Status: AC
  Administered 2022-07-28: 1000 ug via INTRAMUSCULAR

## 2022-07-28 NOTE — Progress Notes (Signed)
Pt presented for their vitamin B12 injection. Pt was identified through two identifiers. Pt tolerated shot well in their left  deltoid.  

## 2022-08-22 ENCOUNTER — Institutional Professional Consult (permissible substitution) (HOSPITAL_BASED_OUTPATIENT_CLINIC_OR_DEPARTMENT_OTHER): Payer: PPO | Admitting: Pulmonary Disease

## 2022-08-27 ENCOUNTER — Encounter: Payer: Self-pay | Admitting: Internal Medicine

## 2022-08-27 ENCOUNTER — Ambulatory Visit (INDEPENDENT_AMBULATORY_CARE_PROVIDER_SITE_OTHER): Payer: PPO | Admitting: Internal Medicine

## 2022-08-27 VITALS — BP 130/80 | HR 46 | Temp 97.7°F | Ht 65.0 in | Wt 210.6 lb

## 2022-08-27 DIAGNOSIS — G4733 Obstructive sleep apnea (adult) (pediatric): Secondary | ICD-10-CM

## 2022-08-27 DIAGNOSIS — J439 Emphysema, unspecified: Secondary | ICD-10-CM | POA: Diagnosis not present

## 2022-08-27 NOTE — Patient Instructions (Addendum)
Continue CPAP as prescribed excellent job A+  Avoid secondhand smoke Avoid SICK contacts Recommend  Masking  when appropriate Recommend Keep up-to-date with vaccinations  Follow-up lung cancer screening protocol  Recommend weight loss

## 2022-08-27 NOTE — Progress Notes (Signed)
Name: Linda Henson MRN: [id number] DOB: [birth year]     Pulmonary tests:  PFT 03/26/17 >> FEV1 2.58 (103%), FEV1% 80, TLC 4.21 (83%), DLCO 59%  Chest imaging:  LDCT chest 09/29/18 >> mild subpleural reticulation/ paraseptal emphysema in lung apices, mild subpleural reticulation/dependent ATX RLL, 1.9 mm LUL, 1.6 mm calcified granuloma LLL, mod HH, fatty liver  LDCT 09/2021 Centrilobular and paraseptal emphysema. Smoking related respiratory bronchiolitis. Calcified granuloma. 2.1 mm left upper lobe nodule. No suspicious pulmonary nodules. No pleural fluid. Airway is unremarkable  Serology:  Labs 04/12/19 >> RA 11.3, CCP 7, CRP 3  Sleep:  PSG 10/01/14 >> AHI 62, SpO2 low 65.2% Auto CPAP 07/05/19 to 08/03/19 >> used on 30 of 30 nights with average 7 hrs 52 min.  Average AHI 1.3 with median CPAP 11 and 95 th percentile CPAP 15 cm H2O.   CHIEF COMPLAINT:  ASSESSMENT  OF OSA   HISTORY OF PRESENT ILLNESS: Patient is seen today for previous problems and issues with sleep related to excessive daytime sleepiness,Patient  has been having sleep problems for many years Patient has been having excessive daytime sleepiness for a long time Patient has been having extreme fatigue and tiredness, lack of energy +  very Loud snoring every night  Ultimately Patient diagnosed with sleep apnea many years ago CPAP download reviewed with patient in detail the last 3 months Patient has 100% compliance for days and greater than 4 hours AHI is significantly reduced to 1.6 Auto CPAP 4-20 Uses full facemask Patient uses and benefits from therapy     Discussed sleep data and reviewed with patient.  Encouraged proper weight management.  Discussed driving precautions and its relationship with hypersomnolence.  Discussed operating dangerous equipment and its relationship with hypersomnolence.  Discussed sleep hygiene, and benefits of a fixed sleep waked time.  The importance of getting eight or more  hours of sleep discussed with patient.  Discussed limiting the use of the computer and television before bedtime.  Decrease naps during the day, so night time sleep will become enhanced.  Limit caffeine, and sleep deprivation.  HTN, stroke, and heart failure are potential risk factors.    EPWORTH SLEEP SCORE 11  Former smoker Diagnosis of emphysema Currently on albuterol as needed No maintenance therapy at this time  No exacerbation at this time No evidence of heart failure at this time No evidence or signs of infection at this time No respiratory distress No fevers, chills, nausea, vomiting, diarrhea No evidence of lower extremity edema No evidence hemoptysis   PAST MEDICAL HISTORY :   has a past medical history of Arthritis, Colon polyp, COPD (chronic obstructive pulmonary disease) (HCC), Diverticulitis, GERD (gastroesophageal reflux disease), Hepatitis C (2018), History of gastric ulcer, Hyperlipidemia, Hypertension, Iron deficiency anemia (2014), and Sleep apnea.  has a past surgical history that includes Abdominal hysterectomy (1994); Salpingoophorectomy (Right, 1994); Eye surgery; Colonoscopy with propofol (N/A, 02/05/2015); Cataract extraction w/PHACO (Right, 08/30/2019); and Colonoscopy (N/A, 08/19/2021). Prior to Admission medications   Medication Sig Start Date End Date Taking? Authorizing Provider  albuterol (VENTOLIN HFA) 108 (90 Base) MCG/ACT inhaler Inhale 2 puffs into the lungs every 4 (four) hours as needed for wheezing or shortness of breath. 04/29/21  Yes Rodriguez-Southworth, Nettie Elm, PA-C  clobetasol ointment (TEMOVATE) 0.05 % Apply 1 Application topically 2 (two) times daily. Apply to aa's BID PRN flares. 11/26/21  Yes Willeen Niece, MD  pantoprazole (PROTONIX) 40 MG tablet Take 1 tablet (40 mg total) by mouth daily. 04/09/22  Yes Glori Luis, MD  traZODone (DESYREL) 50 MG tablet TAKE 1/2 TO 1 TABLET(25 TO 50 MG) BY MOUTH AT BEDTIME AS NEEDED FOR SLEEP 06/23/22  Yes  Glori Luis, MD  triamcinolone ointment (KENALOG) 0.1 % Apply to affected areas twice a week 03/25/22  Yes Moye, [place], MD  valsartan (DIOVAN) 160 MG tablet TAKE 1 TABLET(160 MG) BY MOUTH DAILY 12/04/21  Yes Glori Luis, MD   No Known Allergies  FAMILY HISTORY:  family history includes Cancer (age of onset: 57) in her brother; Diabetes in her paternal grandmother; Heart disease (age of onset: 21) in her father; Hyperlipidemia in her father; Hypothyroidism in her sister. SOCIAL HISTORY:  reports that she quit smoking about 29 years ago. Her smoking use included cigarettes. She has never used smokeless tobacco. She reports current alcohol use of about 14.0 standard drinks of alcohol per week. She reports that she does not use drugs.   Review of Systems:  Gen:  Denies  fever, sweats, chills weight loss  HEENT: Denies blurred vision, double vision, ear pain, eye pain, hearing loss, nose bleeds, sore throat Cardiac:  No dizziness, chest pain or heaviness, chest tightness,edema, No JVD Resp:   No cough, -sputum production, -shortness of breath,-wheezing, -hemoptysis,  Gi: Denies swallowing difficulty, stomach pain, nausea or vomiting, diarrhea, constipation, bowel incontinence Gu:  Denies bladder incontinence, burning urine Ext:   Denies Joint pain, stiffness or swelling Skin: Denies  skin rash, easy bruising or bleeding or hives Endoc:  Denies polyuria, polydipsia , polyphagia or weight change Psych:   Denies depression, insomnia or hallucinations  Other:  All other systems negative   ALL OTHER ROS ARE NEGATIVE   BP 130/80 (BP Location: Right Arm, Patient Position: Sitting, Cuff Size: Large)   Pulse (!) 46 Comment: recheck up to 51  Temp 97.7 F (36.5 C) (Oral)   Ht 5\' 5"  (1.651 m)   Wt 210 lb 9.6 oz (95.5 kg)   SpO2 98%   BMI 35.05 kg/m     Physical Examination:   General Appearance: No distress  EYES PERRLA, EOM intact.   NECK Supple, No JVD Pulmonary: normal  breath sounds, No wheezing.  CardiovascularNormal S1,S2.  No m/r/g.   Abdomen: Benign, Soft, non-tender. Skin:   warm, no rashes, no ecchymosis  Extremities: normal, no cyanosis, clubbing. Neuro:without focal findings,  speech normal  PSYCHIATRIC: Mood, affect within normal limits.   ALL OTHER ROS ARE NEGATIVE    ASSESSMENT AND PLAN SYNOPSIS  67 year old pleasant white female seen today for assessment of OSA along with emphysema and former smoker  Regarding sleep apnea Patient with severe sleep apnea however well-controlled with CPAP 4-20 AHI reduced to 1.6 Patient has excellent compliance report reviewed in detail with patient Continue CPAP as prescribed  Regarding emphysema Patient is to avoid cigarette smoke Avoid secondhand smoke Continue albuterol as needed  Former smoker High risk for cancer Follow-up lung cancer screening protocol   Obesity -recommend significant weight loss -recommend changing diet  Deconditioned state -Recommend increased daily activity and exercise   MEDICATION ADJUSTMENTS/LABS AND TESTS ORDERED: Continue CPAP as prescribed excellent job A+  Avoid secondhand smoke Avoid SICK contacts Recommend  Masking  when appropriate Recommend Keep up-to-date with vaccinations  Follow-up lung cancer screening protocol Recommend weight loss   CURRENT MEDICATIONS REVIEWED AT LENGTH WITH PATIENT TODAY   Patient  satisfied with Plan of action and management. All questions answered  Follow up  3 months  Total Time Spent  45 mins   Lucie Leather, M.D.  [facility]  Medical Director [facility] Medical Director [facility] Cardio-Pulmonary Department

## 2022-08-27 NOTE — Addendum Note (Signed)
Addended by: Delrae Rend on: 08/27/2022 12:18 PM   Modules accepted: Orders

## 2022-08-27 NOTE — Addendum Note (Signed)
Addended by: Delrae Rend on: 08/27/2022 10:31 AM   Modules accepted: Orders

## 2022-08-29 ENCOUNTER — Ambulatory Visit: Payer: PPO | Admitting: Family Medicine

## 2022-09-01 ENCOUNTER — Ambulatory Visit (INDEPENDENT_AMBULATORY_CARE_PROVIDER_SITE_OTHER): Payer: PPO

## 2022-09-01 DIAGNOSIS — E538 Deficiency of other specified B group vitamins: Secondary | ICD-10-CM

## 2022-09-01 DIAGNOSIS — G4733 Obstructive sleep apnea (adult) (pediatric): Secondary | ICD-10-CM | POA: Diagnosis not present

## 2022-09-01 MED ORDER — CYANOCOBALAMIN 1000 MCG/ML IJ SOLN
1000.0000 ug | Freq: Once | INTRAMUSCULAR | Status: AC
  Administered 2022-09-01: 1000 ug via INTRAMUSCULAR

## 2022-09-01 NOTE — Progress Notes (Signed)
Pt presented for their vitamin B12 injection. Pt was identified through two identifiers. Pt tolerated shot well in their right deltoid.  

## 2022-09-12 DIAGNOSIS — G4733 Obstructive sleep apnea (adult) (pediatric): Secondary | ICD-10-CM | POA: Diagnosis not present

## 2022-09-22 ENCOUNTER — Ambulatory Visit: Payer: PPO | Admitting: Dermatology

## 2022-09-29 ENCOUNTER — Ambulatory Visit: Payer: PPO | Admitting: Family Medicine

## 2022-09-29 ENCOUNTER — Encounter: Payer: Self-pay | Admitting: Family Medicine

## 2022-09-29 VITALS — BP 118/74 | HR 49 | Temp 98.4°F | Ht 65.0 in | Wt 211.4 lb

## 2022-09-29 DIAGNOSIS — F32A Anxiety disorder, unspecified: Secondary | ICD-10-CM

## 2022-09-29 DIAGNOSIS — M791 Myalgia, unspecified site: Secondary | ICD-10-CM | POA: Diagnosis not present

## 2022-09-29 DIAGNOSIS — G4733 Obstructive sleep apnea (adult) (pediatric): Secondary | ICD-10-CM | POA: Diagnosis not present

## 2022-09-29 DIAGNOSIS — R001 Bradycardia, unspecified: Secondary | ICD-10-CM | POA: Insufficient documentation

## 2022-09-29 DIAGNOSIS — F419 Anxiety disorder, unspecified: Secondary | ICD-10-CM

## 2022-09-29 DIAGNOSIS — E538 Deficiency of other specified B group vitamins: Secondary | ICD-10-CM

## 2022-09-29 DIAGNOSIS — I10 Essential (primary) hypertension: Secondary | ICD-10-CM | POA: Diagnosis not present

## 2022-09-29 MED ORDER — PANTOPRAZOLE SODIUM 40 MG PO TBEC: 40.0000 mg | DELAYED_RELEASE_TABLET | Freq: Every day | ORAL | 1 refills | Status: DC

## 2022-09-29 MED ORDER — CYANOCOBALAMIN 1000 MCG/ML IJ SOLN
1000.0000 ug | Freq: Once | INTRAMUSCULAR | Status: AC
Start: 2022-09-29 — End: 2022-09-29
  Administered 2022-09-29: 1000 ug via INTRAMUSCULAR

## 2022-09-29 MED ORDER — TRAZODONE HCL 50 MG PO TABS: ORAL_TABLET | ORAL | 3 refills | Status: DC

## 2022-09-29 NOTE — Assessment & Plan Note (Signed)
Chronic issue.  Patient will schedule follow-up with her pulmonologist.  She will continue her CPAP.

## 2022-09-29 NOTE — Progress Notes (Signed)
Marikay Alar, MD Phone: [phone]  Linda Henson is a 67 y.o. female who presents today for follow-up.  Hypertension: Not checking blood pressures.  She is taking valsartan.  No chest pain, shortness of breath, or edema.  OSA: Patient notes she got a new CPAP recently.  She saw a new pulmonologist for this.  She does report some hypersomnia.  She does wake up well rested most of the time.  She notes her CPAP company advise she needed to see her pulmonologist for follow-up within 90 days of starting the CPAP.  Leg pain: This continues to be an issue.  She notes it hurts in the morning.  It aches.  It gets better as she walks more.  It got somewhat better when she came off the cholesterol medication.  Bradycardia: Patient notes on 2 occasions recently she has had heart rates into the 40s.  She reports during a Medicare exam it was 44 though it came up into the 50s.  Her heart rate was also 46 when she saw pulmonology.  She does report some fatigue though no palpitations.  She has been working on increasing her activity level.  Social History   Tobacco Use  Smoking Status Former   Current packs/day: 0.00   Types: Cigarettes   Start date: 12/15/1988   Quit date: 12/15/1992   Years since quitting: 29.8  Smokeless Tobacco Never  Tobacco Comments   Smoked a pack per week.    Current Outpatient Medications on File Prior to Visit  Medication Sig Dispense Refill   albuterol (VENTOLIN HFA) 108 (90 Base) MCG/ACT inhaler Inhale 2 puffs into the lungs every 4 (four) hours as needed for wheezing or shortness of breath. 18 g 0   clobetasol ointment (TEMOVATE) 0.05 % Apply 1 Application topically 2 (two) times daily. Apply to aa's BID PRN flares. 60 g 1   triamcinolone ointment (KENALOG) 0.1 % Apply to affected areas twice a week 80 g 2   valsartan (DIOVAN) 160 MG tablet TAKE 1 TABLET(160 MG) BY MOUTH DAILY 90 tablet 3   No current facility-administered medications on file prior to visit.      ROS see history of present illness  Objective  Physical Exam Vitals:   09/29/22 1342  BP: 118/74  Pulse: (!) 49  Temp: 98.4 F (36.9 C)  SpO2: 98%    BP Readings from Last 3 Encounters:  09/29/22 118/74  08/27/22 130/80  07/02/22 124/82   Wt Readings from Last 3 Encounters:  09/29/22 211 lb 6.4 oz (95.9 kg)  08/27/22 210 lb 9.6 oz (95.5 kg)  05/26/22 206 lb 9.6 oz (93.7 kg)    Physical Exam Constitutional:      General: She is not in acute distress.    Appearance: She is not diaphoretic.  Cardiovascular:     Rate and Rhythm: Regular rhythm. Bradycardia present.     Heart sounds: Normal heart sounds.  Pulmonary:     Effort: Pulmonary effort is normal.     Breath sounds: Normal breath sounds.  Musculoskeletal:     Right lower leg: No edema.     Left lower leg: No edema.     Comments: Muscles in the patient's legs are sore to palpation.  Skin:    General: Skin is warm and dry.  Neurological:     Mental Status: She is alert.    EKG: Sinus bradycardia, rate 54, no ischemic changes, no arrhythmias noted  Assessment/Plan: Please see individual problem list.  Bradycardia Assessment &  Plan: This seems to be a persistent issue.  EKG performed today.  Lab work as outlined.  Refer to cardiology.  Advised to seek medical attention for palpitations, excessive fatigue, chest pain, or shortness of breath.  Orders: -     EKG 12-Lead -     TSH -     Ambulatory referral to Cardiology  Anxiety and depression -     traZODone HCl; TAKE 1/2 TO 1 TABLET(25 TO 50 MG) BY MOUTH AT BEDTIME AS NEEDED FOR SLEEP  Dispense: 30 tablet; Refill: 3  Myalgia Assessment & Plan: Chronic issue.  Continues to occur despite being off of her statin.  Will check lab work as outlined.  Orders: -     CBC with Differential/Platelet -     Comprehensive metabolic panel -     CK -     VITAMIN D 25 Hydroxy (Vit-D Deficiency, Fractures)  Hypertension, unspecified type Assessment &  Plan: Chronic issue.  Patient will continue valsartan 160 mg daily.   OSA on CPAP Assessment & Plan: Chronic issue.  Patient will schedule follow-up with her pulmonologist.  She will continue her CPAP.   Other orders -     Pantoprazole Sodium; Take 1 tablet (40 mg total) by mouth daily.  Dispense: 90 tablet; Refill: 1    Return in about 6 months (around 04/01/2023).   Marikay Alar, MD [facility]

## 2022-09-29 NOTE — Assessment & Plan Note (Signed)
Chronic issue.  Patient will continue valsartan 160 mg daily.

## 2022-09-29 NOTE — Patient Instructions (Signed)
Nice to see you. Cardiology will contact you to schedule an evaluation for your bradycardia. We will get lab work today and contact you with the results. If you start to have palpitations or have excessive fatigue or develop chest pain or shortness of breath please seek medical attention immediately.

## 2022-09-29 NOTE — Assessment & Plan Note (Signed)
Chronic issue.  Continues to occur despite being off of her statin.  Will check lab work as outlined.

## 2022-09-29 NOTE — Addendum Note (Signed)
Addended by: Prince Solian A on: 09/29/2022 02:52 PM   Modules accepted: Orders

## 2022-09-29 NOTE — Progress Notes (Signed)
Patient was administered a B12 injection into her Left deltoid. Patient tolerated the injection well and did not show any signs of distress or voice any concerns.

## 2022-09-29 NOTE — Assessment & Plan Note (Addendum)
This seems to be a persistent issue.  EKG performed today.  Lab work as outlined.  Refer to cardiology.  Advised to seek medical attention for palpitations, excessive fatigue, chest pain, or shortness of breath.

## 2022-09-30 LAB — COMPREHENSIVE METABOLIC PANEL
ALT: 17 U/L (ref 0–35)
AST: 13 U/L (ref 0–37)
Albumin: 4.1 g/dL (ref 3.5–5.2)
Alkaline Phosphatase: 53 U/L (ref 39–117)
BUN: 18 mg/dL (ref 6–23)
CO2: 24 mEq/L (ref 19–32)
Calcium: 9.4 mg/dL (ref 8.4–10.5)
Chloride: 102 mEq/L (ref 96–112)
Creatinine, Ser: 0.91 mg/dL (ref 0.40–1.20)
GFR: 65.29 mL/min (ref >60.00)
Glucose, Bld: 114 mg/dL — ABNORMAL HIGH (ref 70–99)
Potassium: 4 mEq/L (ref 3.5–5.1)
Sodium: 136 mEq/L (ref 135–145)
Total Bilirubin: 0.6 mg/dL (ref 0.2–1.2)
Total Protein: 7 g/dL (ref 6.0–8.3)

## 2022-09-30 LAB — CBC WITH DIFFERENTIAL/PLATELET
Basophils Absolute: 0.1 10*3/uL (ref 0.0–0.1)
Basophils Relative: 0.8 % (ref 0.0–3.0)
Eosinophils Absolute: 0.4 10*3/uL (ref 0.0–0.7)
Eosinophils Relative: 4.3 % (ref 0.0–5.0)
HCT: 40.5 % (ref 36.0–46.0)
Hemoglobin: 14 g/dL (ref 12.0–15.0)
Lymphocytes Relative: 31.8 % (ref 12.0–46.0)
Lymphs Abs: 3.3 10*3/uL (ref 0.7–4.0)
MCHC: 34.6 g/dL (ref 30.0–36.0)
MCV: 97.6 fl (ref 78.0–100.0)
Monocytes Absolute: 0.6 10*3/uL (ref 0.1–1.0)
Monocytes Relative: 6.2 % (ref 3.0–12.0)
Neutro Abs: 5.8 10*3/uL (ref 1.4–7.7)
Neutrophils Relative %: 56.9 % (ref 43.0–77.0)
Platelets: 185 10*3/uL (ref 150.0–400.0)
RBC: 4.15 Mil/uL (ref 3.87–5.11)
RDW: 12.7 % (ref 11.5–15.5)
WBC: 10.2 10*3/uL (ref 4.0–10.5)

## 2022-09-30 LAB — VITAMIN D 25 HYDROXY (VIT D DEFICIENCY, FRACTURES): VITD: 25.08 ng/mL — ABNORMAL LOW (ref 30.00–100.00)

## 2022-09-30 LAB — TSH: TSH: 1.69 u[IU]/mL (ref 0.35–5.50)

## 2022-09-30 LAB — CK: Total CK: 67 U/L (ref 7–177)

## 2022-10-01 DIAGNOSIS — G4733 Obstructive sleep apnea (adult) (pediatric): Secondary | ICD-10-CM | POA: Diagnosis not present

## 2022-10-06 ENCOUNTER — Other Ambulatory Visit: Payer: Self-pay | Admitting: Family Medicine

## 2022-10-06 DIAGNOSIS — E559 Vitamin D deficiency, unspecified: Secondary | ICD-10-CM

## 2022-10-14 DIAGNOSIS — G4733 Obstructive sleep apnea (adult) (pediatric): Secondary | ICD-10-CM | POA: Diagnosis not present

## 2022-10-15 ENCOUNTER — Ambulatory Visit
Admission: RE | Admit: 2022-10-15 | Discharge: 2022-10-15 | Disposition: A | Discharge status: Home / Self Care | Payer: PPO | Source: Ambulatory Visit | Attending: Acute Care | Admitting: Acute Care

## 2022-10-15 DIAGNOSIS — Z122 Encounter for screening for malignant neoplasm of respiratory organs: Secondary | ICD-10-CM | POA: Insufficient documentation

## 2022-10-15 DIAGNOSIS — F17210 Nicotine dependence, cigarettes, uncomplicated: Secondary | ICD-10-CM | POA: Diagnosis not present

## 2022-10-15 DIAGNOSIS — Z87891 Personal history of nicotine dependence: Secondary | ICD-10-CM | POA: Diagnosis not present

## 2022-10-16 ENCOUNTER — Ambulatory Visit: Payer: PPO | Admitting: Internal Medicine

## 2022-10-16 ENCOUNTER — Encounter: Payer: Self-pay | Admitting: Internal Medicine

## 2022-10-16 VITALS — BP 120/80 | HR 50 | Temp 97.9°F | Ht 65.0 in | Wt 210.4 lb

## 2022-10-16 DIAGNOSIS — G4733 Obstructive sleep apnea (adult) (pediatric): Secondary | ICD-10-CM | POA: Diagnosis not present

## 2022-10-16 DIAGNOSIS — J449 Chronic obstructive pulmonary disease, unspecified: Secondary | ICD-10-CM

## 2022-10-16 NOTE — Progress Notes (Signed)
Name: Linda Henson MRN: [id number] DOB: 04-16-1955     Pulmonary tests:  PFT 03/26/17 >> FEV1 2.58 (103%), FEV1% 80, TLC 4.21 (83%), DLCO 59%  Chest imaging:  LDCT chest 09/29/18 >> mild subpleural reticulation/ paraseptal emphysema in lung apices, mild subpleural reticulation/dependent ATX RLL, 1.9 mm LUL, 1.6 mm calcified granuloma LLL, mod HH, fatty liver  LDCT 09/2021 Centrilobular and paraseptal emphysema. Smoking related respiratory bronchiolitis. Calcified granuloma. 2.1 mm left upper lobe nodule. No suspicious pulmonary nodules. No pleural fluid. Airway is unremarkable  Serology:  Labs 04/12/19 >> RA 11.3, CCP 7, CRP 3  Sleep:  PSG 10/01/14 >> AHI 62, SpO2 low 65.2% Auto CPAP 07/05/19 to 08/03/19 >> used on 30 of 30 nights with average 7 hrs 52 min.  Average AHI 1.3 with median CPAP 11 and 95 th percentile CPAP 15 cm H2O.   CHIEF COMPLAINT:  ASSESSMENT  OF OSA   HISTORY OF PRESENT ILLNESS:   Ultimately Patient diagnosed with sleep apnea many years ago CPAP download reviewed with patient in detail the last 3 months Patient has 100% compliance for days and greater than 4 hours AHI is significantly reduced to 1.6 Auto CPAP 4-20 Uses full facemask Patient uses and benefits from therapy      Former smoker Diagnosis of emphysema Currently on albuterol as needed No maintenance therapy at this time  No exacerbation at this time No evidence of heart failure at this time No evidence or signs of infection at this time No respiratory distress No fevers, chills, nausea, vomiting, diarrhea No evidence of lower extremity edema No evidence hemoptysis   PAST MEDICAL HISTORY :   has a past medical history of Arthritis, Colon polyp, COPD (chronic obstructive pulmonary disease) (HCC), Diverticulitis, GERD (gastroesophageal reflux disease), Hepatitis C (2018), History of gastric ulcer, Hyperlipidemia, Hypertension, Iron deficiency anemia (2014), and Sleep apnea.  has a  past surgical history that includes Abdominal hysterectomy (1994); Salpingoophorectomy (Right, 1994); Eye surgery; Colonoscopy with propofol (N/A, 02/05/2015); Cataract extraction w/PHACO (Right, 08/30/2019); and Colonoscopy (N/A, 08/19/2021). Prior to Admission medications   Medication Sig Start Date End Date Taking? Authorizing Provider  albuterol (VENTOLIN HFA) 108 (90 Base) MCG/ACT inhaler Inhale 2 puffs into the lungs every 4 (four) hours as needed for wheezing or shortness of breath. 04/29/21  Yes Rodriguez-Southworth, Nettie Elm, PA-C  clobetasol ointment (TEMOVATE) 0.05 % Apply 1 Application topically 2 (two) times daily. Apply to aa's BID PRN flares. 11/26/21  Yes Willeen Niece, MD  pantoprazole (PROTONIX) 40 MG tablet Take 1 tablet (40 mg total) by mouth daily. 04/09/22  Yes Glori Luis, MD  traZODone (DESYREL) 50 MG tablet TAKE 1/2 TO 1 TABLET(25 TO 50 MG) BY MOUTH AT BEDTIME AS NEEDED FOR SLEEP 06/23/22  Yes Glori Luis, MD  triamcinolone ointment (KENALOG) 0.1 % Apply to affected areas twice a week 03/25/22  Yes Moye, [place], MD  valsartan (DIOVAN) 160 MG tablet TAKE 1 TABLET(160 MG) BY MOUTH DAILY 12/04/21  Yes Glori Luis, MD   No Known Allergies  FAMILY HISTORY:  family history includes Cancer (age of onset: 31) in her brother; Diabetes in her paternal grandmother; Heart disease (age of onset: 31) in her father; Hyperlipidemia in her father; Hypothyroidism in her sister. SOCIAL HISTORY:  reports that she quit smoking about 29 years ago. Her smoking use included cigarettes. She started smoking about 33 years ago. She has never used smokeless tobacco. She reports current alcohol use of about 14.0 standard drinks of alcohol per  week. She reports that she does not use drugs.  BP 120/80 (BP Location: Left Arm, Cuff Size: Large)   Pulse (!) 50   Temp 97.9 F (36.6 C) (Temporal)   Ht 5\' 5"  (1.651 m)   Wt 210 lb 6.4 oz (95.4 kg)   SpO2 96%   BMI 35.01 kg/m     Review of  Systems: Gen:  Denies  fever, sweats, chills weight loss  HEENT: Denies blurred vision, double vision, ear pain, eye pain, hearing loss, nose bleeds, sore throat Cardiac:  No dizziness, chest pain or heaviness, chest tightness,edema, No JVD Resp:   No cough, -sputum production, -shortness of breath,-wheezing, -hemoptysis,  Other:  All other systems negative   Physical Examination:   General Appearance: No distress  EYES PERRLA, EOM intact.   NECK Supple, No JVD Pulmonary: normal breath sounds, No wheezing.  CardiovascularNormal S1,S2.  No m/r/g.   Abdomen: Benign, Soft, non-tender. Neurology UE/LE 5/5 strength, no focal deficits Ext pulses intact, cap refill intact ALL OTHER ROS ARE NEGATIVE    ASSESSMENT AND PLAN SYNOPSIS  67 year old pleasant white female seen today for assessment of OSA along with emphysema and former smoker  Regarding sleep apnea Patient with severe sleep apnea however well-controlled with CPAP 4-20 AHI reduced to 1.6 Patient has excellent compliance report reviewed in detail with patient Continue CPAP as prescribed   COPD Patient is to avoid cigarette smoke Continue albuterol as needed   Former smoker High risk for cancer Follow-up lung cancer screening protocol   Obesity -recommend significant weight loss -recommend changing diet  Deconditioned state -Recommend increased daily activity and exercise    MEDICATION ADJUSTMENTS/LABS AND TESTS ORDERED: Continue CPAP as prescribed excellent job A+  Avoid secondhand smoke Avoid SICK contacts Recommend  Masking  when appropriate Recommend Keep up-to-date with vaccinations  Follow-up lung cancer screening protocol Recommend weight loss   CURRENT MEDICATIONS REVIEWED AT LENGTH WITH PATIENT TODAY   Patient  satisfied with Plan of action and management. All questions answered  Follow up  3 months  Total Time Spent  45 mins   Wallis Bamberg Santiago Glad, M.D.  [facility]  Medical Director [facility] Medical Director [facility] Cardio-Pulmonary Department

## 2022-10-16 NOTE — Patient Instructions (Signed)
Continue CPAP as prescribed Excellent Job A+  Avoid secondhand smoke Avoid SICK contacts Recommend  Masking  when appropriate Recommend Keep up-to-date with vaccinations  Follow-up lung cancer screening protocol Recommend weight loss

## 2022-10-17 DIAGNOSIS — H2512 Age-related nuclear cataract, left eye: Secondary | ICD-10-CM | POA: Diagnosis not present

## 2022-10-17 DIAGNOSIS — H538 Other visual disturbances: Secondary | ICD-10-CM | POA: Diagnosis not present

## 2022-10-17 DIAGNOSIS — M3501 Sicca syndrome with keratoconjunctivitis: Secondary | ICD-10-CM | POA: Diagnosis not present

## 2022-10-17 DIAGNOSIS — H2640 Unspecified secondary cataract: Secondary | ICD-10-CM | POA: Diagnosis not present

## 2022-10-17 DIAGNOSIS — D3131 Benign neoplasm of right choroid: Secondary | ICD-10-CM | POA: Diagnosis not present

## 2022-11-01 DIAGNOSIS — G4733 Obstructive sleep apnea (adult) (pediatric): Secondary | ICD-10-CM | POA: Diagnosis not present

## 2022-11-07 ENCOUNTER — Encounter: Payer: Self-pay | Admitting: Family Medicine

## 2022-11-07 DIAGNOSIS — R918 Other nonspecific abnormal finding of lung field: Secondary | ICD-10-CM

## 2022-11-27 ENCOUNTER — Ambulatory Visit: Payer: PPO

## 2022-11-27 ENCOUNTER — Ambulatory Visit: Payer: PPO | Attending: Cardiology | Admitting: Cardiology

## 2022-11-27 ENCOUNTER — Encounter: Payer: Self-pay | Admitting: Cardiology

## 2022-11-27 VITALS — BP 134/82 | HR 53 | Ht 65.0 in | Wt 211.0 lb

## 2022-11-27 DIAGNOSIS — R011 Cardiac murmur, unspecified: Secondary | ICD-10-CM

## 2022-11-27 DIAGNOSIS — R001 Bradycardia, unspecified: Secondary | ICD-10-CM

## 2022-11-27 DIAGNOSIS — I10 Essential (primary) hypertension: Secondary | ICD-10-CM | POA: Diagnosis not present

## 2022-11-27 DIAGNOSIS — Z6835 Body mass index (BMI) 35.0-35.9, adult: Secondary | ICD-10-CM

## 2022-11-27 NOTE — Patient Instructions (Signed)
Medication Instructions:   Your physician recommends that you continue on your current medications as directed. Please refer to the Current Medication list given to you today.  *If you need a refill on your cardiac medications before your next appointment, please call your pharmacy*   Lab Work:  None Ordered  If you have labs (blood work) drawn today and your tests are completely normal, you will receive your results only by: MyChart Message (if you have MyChart) OR A paper copy in the mail If you have any lab test that is abnormal or we need to change your treatment, we will call you to review the results.   Testing/Procedures:  Your physician has requested that you have an echocardiogram. Echocardiography is a painless test that uses sound waves to create images of your heart. It provides your doctor with information about the size and shape of your heart and how well your heart's chambers and valves are working. This procedure takes approximately one hour. There are no restrictions for this procedure. Please do NOT wear cologne, perfume, aftershave, or lotions (deodorant is allowed). Please arrive 15 minutes prior to your appointment time.  Your physician has recommended that you wear a Zio monitor.   This monitor is a medical device that records the heart's electrical activity. Doctors most often use these monitors to diagnose arrhythmias. Arrhythmias are problems with the speed or rhythm of the heartbeat. The monitor is a small device applied to your chest. You can wear one while you do your normal daily activities. While wearing this monitor if you have any symptoms to push the button and record what you felt. Once you have worn this monitor for the period of time provider prescribed (Usually 14 days), you will return the monitor device in the postage paid box. Once it is returned they will download the data collected and provide [place] with a report which the provider will then review and  we will call you with those results. Important tips:  Avoid showering during the first 24 hours of wearing the monitor. Avoid excessive sweating to help maximize wear time. Do not submerge the device, no hot tubs, and no swimming pools. Keep any lotions or oils away from the patch. After 24 hours you may shower with the patch on. Take brief showers with your back facing the shower head.  Do not remove patch once it has been placed because that will interrupt data and decrease adhesive wear time. Push the button when you have any symptoms and write down what you were feeling. Once you have completed wearing your monitor, remove and place into box which has postage paid and place in your outgoing mailbox.  If for some reason you have misplaced your box then call our office and we can provide another box and/or mail it off for you.     Follow-Up: At [facility], you and your health needs are our priority.  As part of our continuing mission to provide you with exceptional heart care, we have created designated Provider Care Teams.  These Care Teams include your primary Cardiologist (physician) and Advanced Practice Providers (APPs -  Physician Assistants and Nurse Practitioners) who all work together to provide you with the care you need, when you need it.  We recommend signing up for the patient portal called "MyChart".  Sign up information is provided on this After Visit Summary.  MyChart is used to connect with patients for Virtual Visits (Telemedicine).  Patients are able to view  lab/test results, encounter notes, upcoming appointments, etc.  Non-urgent messages can be sent to your provider as well.   To learn more about what you can do with MyChart, go to [email or web address].    Your next appointment:    After Cardiac Testing  Provider:   You may see Debbe Odea, MD\ or one of the following Advanced Practice Providers on your designated Care Team:   Nicolasa Ducking,  NP Eula Listen, PA-C Cadence Fransico Michael, PA-C Charlsie Quest, NP

## 2022-11-27 NOTE — Progress Notes (Signed)
Cardiology Office Note:    Date:  11/27/2022   ID:  Linda Henson, DOB [birth year], MRN [id number]  PCP:  Glori Luis, MD   [facility] Providers Cardiologist:  Debbe Odea, MD     Referring MD: Glori Luis, MD   Chief Complaint  Patient presents with   New Patient (Initial Visit)    Referred for cardiac evaluation of Bradycardia previous seen by cardiology over 10 years ago.  Echo performed in 2022.  Referral notes in EPIC.  Had an episode 3 months ago of tachycardia that lasted 1 hour.   Linda Henson is a 67 y.o. female who is being seen today for the evaluation of bradycardia at the request of Glori Luis, MD.   History of Present Illness:    Linda Henson is a 67 y.o. female with a hx of hypertension, former smoker x 7 years, OSA on CPAP, presenting due to bradycardia.  States having history of low heart rates, may reduce to 40 bpm sometimes.  Denies presyncope or syncope.  Had 1 episode of palpitations about a month ago lasting a couple of minutes, has not had any episodes since.  Denies chest pain or shortness of breath.  Endorses EtOH use, drinking 2 bourbons daily.  Compliant with CPAP mask.   Echo 10/22 EF 60 to 65%  Past Medical History:  Diagnosis Date   Arthritis    spine - mild   Colon polyp    Repeat colonoscopy 2018   COPD (chronic obstructive pulmonary disease) (HCC)    Diverticulitis    GERD (gastroesophageal reflux disease)    Resolved with wt loss   Hepatitis C 2018   treated with Harvoni   History of gastric ulcer    Hyperlipidemia    Borderline    Hypertension    Iron deficiency anemia 2014   Sleep apnea    CPAP    Past Surgical History:  Procedure Laterality Date   ABDOMINAL HYSTERECTOMY  1994   CATARACT EXTRACTION W/PHACO Right 08/30/2019   Procedure: CATARACT EXTRACTION PHACO AND INTRAOCULAR LENS PLACEMENT (IOC) RIGHT;  Surgeon: Galen Manila, MD;  Location: [facility];  Service:  Ophthalmology;  Laterality: Right;  2.16 0:20.2   COLONOSCOPY N/A 08/19/2021   Procedure: COLONOSCOPY;  Surgeon: Jaynie Collins, DO;  Location: [facility];  Service: Gastroenterology;  Laterality: N/A;   COLONOSCOPY WITH PROPOFOL N/A 02/05/2015   Procedure: COLONOSCOPY WITH PROPOFOL;  Surgeon: Elnita Maxwell, MD;  Location: [facility] END[facility]COPY;  Service: Endoscopy;  Laterality: N/A;   EYE SURGERY     SALPINGOOPHORECTOMY Right 1994    Current Medications: Current Meds  Medication Sig   Calcium Carb-Cholecalciferol (CALCIUM 500 + D PO) Take 1 tablet by mouth daily.   Cholecalciferol (VITAMIN D3) 50 MCG (2000 UT) CAPS Take 1 capsule by mouth daily.   clobetasol ointment (TEMOVATE) 0.05 % Apply 1 Application topically 2 (two) times daily. Apply to aa's BID PRN flares.   pantoprazole (PROTONIX) 40 MG tablet Take 1 tablet (40 mg total) by mouth daily.   traZODone (DESYREL) 50 MG tablet TAKE 1/2 TO 1 TABLET(25 TO 50 MG) BY MOUTH AT BEDTIME AS NEEDED FOR SLEEP   valsartan (DIOVAN) 160 MG tablet TAKE 1 TABLET(160 MG) BY MOUTH DAILY     Allergies:   Patient has no known allergies.   Social History   Socioeconomic History   Marital status: Widowed    Spouse name: Not on file   Number of  children: 1   Years of education: 14   Highest education level: Some college, no degree  Occupational History   Occupation: Dedicated Museum/gallery conservator: [employer]    Comment: [employer]  Tobacco Use   Smoking status: Former    Current packs/day: 0.00    Types: Cigarettes    Start date: 12/15/1988    Quit date: 12/15/1992    Years since quitting: 29.9   Smokeless tobacco: Never   Tobacco comments:    Smoked a pack per week.  Vaping Use   Vaping status: Never Used  Substance and Sexual Activity   Alcohol use: Yes    Alcohol/week: 14.0 standard drinks of alcohol    Types: 14 Standard drinks or equivalent per week   Drug use: No   Sexual activity: Not Currently    Birth  control/protection: Surgical  Other Topics Concern   Not on file  Social History Narrative   Chyanne grew up in [place], [place]. She is widowed for 5 years. She lives at home with her 82 year old mother. 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 She enjoys playing golf.    Social Determinants of Health   Financial Resource Strain: Low Risk  (09/25/2022)   Overall Financial Resource Strain (CARDIA)    Difficulty of Paying Living Expenses: Not hard at all  Food Insecurity: No Food Insecurity (09/25/2022)   Hunger Vital Sign    Worried About Running Out of Food in the Last Year: Never true    Ran Out of Food in the Last Year: Never true  Transportation Needs: No Transportation Needs (09/25/2022)   PRAPARE - Administrator, Civil Service (Medical): No    Lack of Transportation (Non-Medical): No  Physical Activity: Sufficiently Active (09/25/2022)   Exercise Vital Sign    Days of Exercise per Week: 7 days    Minutes of Exercise per Session: 30 min  Stress: No Stress Concern Present (09/25/2022)   [employer] of Occupational Health - Occupational Stress Questionnaire    Feeling of Stress : Not at all  Social Connections: Moderately Isolated (09/25/2022)   Social Connection and Isolation Panel [NHANES]    Frequency of Communication with Friends and Family: More than three times a week    Frequency of Social Gatherings with Friends and Family: Once a week    Attends Religious Services: 1 to 4 times per year    Active Member of [employer] or Organizations: No    Attends Banker Meetings: Not on file    Marital Status: Widowed     Family History: The patient's family history includes Cancer (age of onset: 7) in her brother; Diabetes in her paternal grandmother; Heart disease (age of onset: 37) in her father; Hyperlipidemia in her father; Hypothyroidism in her sister. There is no history of Breast cancer.  ROS:   Please see  the history of present illness.     All other systems reviewed and are negative.  EKGs/Labs/Other Studies Reviewed:    The following studies were reviewed today:  EKG Interpretation Date/Time:  Thursday November 27 2022 08:59:46 EDT Ventricular Rate:  53 PR Interval:  144 QRS Duration:  88 QT Interval:  430 QTC Calculation: 403 R Axis:   6  Text Interpretation: Sinus bradycardia Inferior infarct , age undetermined Confirmed by Debbe Odea ([id number]) on 11/27/2022 9:07:37 AM    Recent Labs: 09/29/2022: ALT 17; BUN 18; Creatinine, Ser  0.91; Hemoglobin 14.0; Platelets 185.0; Potassium 4.0; Sodium 136; TSH 1.69  Recent Lipid Panel    Component Value Date/Time   CHOL 126 02/17/2022 1013   TRIG 123.0 02/17/2022 1013   HDL 58.90 02/17/2022 1013   CHOLHDL 2 02/17/2022 1013   VLDL 24.6 02/17/2022 1013   LDLCALC 42 02/17/2022 1013   LDLDIRECT 55.0 12/05/2020 1548     Risk Assessment/Calculations:             Physical Exam:    VS:  BP 134/82 (BP Location: Right Arm, Patient Position: Sitting, Cuff Size: Large)   Pulse (!) 53   Ht 5\' 5"  (1.651 m)   Wt 211 lb (95.7 kg)   SpO2 96%   BMI 35.11 kg/m     Wt Readings from Last 3 Encounters:  11/27/22 211 lb (95.7 kg)  10/16/22 210 lb 6.4 oz (95.4 kg)  10/15/22 210 lb (95.3 kg)     GEN:  Well nourished, well developed in no acute distress HEENT: Normal NECK: No JVD; No carotid bruits CARDIAC: RRR, 1/6 systolic murmur loudest at right sternal border RESPIRATORY:  Clear to auscultation without rales, wheezing or rhonchi  ABDOMEN: Soft, non-tender, distended MUSCULOSKELETAL:  No edema; No deformity  SKIN: Warm and dry NEUROLOGIC:  Alert and oriented x 3 PSYCHIATRIC:  Normal affect   ASSESSMENT:    1. Bradycardia   2. Systolic murmur   3. Primary hypertension   4. BMI 35.0-35.9,adult    PLAN:    In order of problems listed above:  Bradycardia, asymptomatic.  EKG today showing sinus bradycardia, with heart rate  53.  OSA could be contributing.  Placed cardiac monitor to rule out any high degree AV block. Systolic murmur, obtain echocardiogram to evaluate any significant valve pathology. Hypertension, BP controlled.  Continue valsartan 160 mg daily. Obesity, low-calorie diet, reduce alcohol consumption, increase activity advised.  After echo and cardiac monitor.      Medication Adjustments/Labs and Tests Ordered: Current medicines are reviewed at length with the patient today.  Concerns regarding medicines are outlined above.  Orders Placed This Encounter  Procedures   LONG TERM MONITOR (3-14 DAYS)   EKG 12-Lead   ECHOCARDIOGRAM COMPLETE   No orders of the defined types were placed in this encounter.   Patient Instructions  Medication Instructions:   Your physician recommends that you continue on your current medications as directed. Please refer to the Current Medication list given to you today.  *If you need a refill on your cardiac medications before your next appointment, please call your pharmacy*   Lab Work:  None Ordered  If you have labs (blood work) drawn today and your tests are completely normal, you will receive your results only by: MyChart Message (if you have MyChart) OR A paper copy in the mail If you have any lab test that is abnormal or we need to change your treatment, we will call you to review the results.   Testing/Procedures:  Your physician has requested that you have an echocardiogram. Echocardiography is a painless test that uses sound waves to create images of your heart. It provides your doctor with information about the size and shape of your heart and how well your heart's chambers and valves are working. This procedure takes approximately one hour. There are no restrictions for this procedure. Please do NOT wear cologne, perfume, aftershave, or lotions (deodorant is allowed). Please arrive 15 minutes prior to your appointment time.  Your physician has  recommended that you  wear a Zio monitor.   This monitor is a medical device that records the heart's electrical activity. Doctors most often use these monitors to diagnose arrhythmias. Arrhythmias are problems with the speed or rhythm of the heartbeat. The monitor is a small device applied to your chest. You can wear one while you do your normal daily activities. While wearing this monitor if you have any symptoms to push the button and record what you felt. Once you have worn this monitor for the period of time provider prescribed (Usually 14 days), you will return the monitor device in the postage paid box. Once it is returned they will download the data collected and provide [place] with a report which the provider will then review and we will call you with those results. Important tips:  Avoid showering during the first 24 hours of wearing the monitor. Avoid excessive sweating to help maximize wear time. Do not submerge the device, no hot tubs, and no swimming pools. Keep any lotions or oils away from the patch. After 24 hours you may shower with the patch on. Take brief showers with your back facing the shower head.  Do not remove patch once it has been placed because that will interrupt data and decrease adhesive wear time. Push the button when you have any symptoms and write down what you were feeling. Once you have completed wearing your monitor, remove and place into box which has postage paid and place in your outgoing mailbox.  If for some reason you have misplaced your box then call our office and we can provide another box and/or mail it off for you.     Follow-Up: At [facility], you and your health needs are our priority.  As part of our continuing mission to provide you with exceptional heart care, we have created designated Provider Care Teams.  These Care Teams include your primary Cardiologist (physician) and Advanced Practice Providers (APPs -  Physician Assistants and Nurse  Practitioners) who all work together to provide you with the care you need, when you need it.  We recommend signing up for the patient portal called "MyChart".  Sign up information is provided on this After Visit Summary.  MyChart is used to connect with patients for Virtual Visits (Telemedicine).  Patients are able to view lab/test results, encounter notes, upcoming appointments, etc.  Non-urgent messages can be sent to your provider as well.   To learn more about what you can do with MyChart, go to [email or web address].    Your next appointment:    After Cardiac Testing  Provider:   You may see Debbe Odea, MD\ or one of the following Advanced Practice Providers on your designated Care Team:   Nicolasa Ducking, NP Eula Listen, PA-C Cadence Fransico Michael, PA-C Charlsie Quest, NP   Signed, Debbe Odea, MD  11/27/2022 10:16 AM    [facility]

## 2022-11-30 DIAGNOSIS — R001 Bradycardia, unspecified: Secondary | ICD-10-CM

## 2022-12-02 DIAGNOSIS — G4733 Obstructive sleep apnea (adult) (pediatric): Secondary | ICD-10-CM | POA: Diagnosis not present

## 2022-12-17 ENCOUNTER — Ambulatory Visit: Payer: PPO | Attending: Cardiology

## 2022-12-17 DIAGNOSIS — R011 Cardiac murmur, unspecified: Secondary | ICD-10-CM | POA: Diagnosis not present

## 2022-12-17 LAB — ECHOCARDIOGRAM COMPLETE
Area-P 1/2: 3.31 cm2
S' Lateral: 3 cm

## 2022-12-22 DIAGNOSIS — R001 Bradycardia, unspecified: Secondary | ICD-10-CM | POA: Diagnosis not present

## 2023-01-01 ENCOUNTER — Other Ambulatory Visit: Payer: Self-pay | Admitting: Family Medicine

## 2023-01-01 DIAGNOSIS — G4733 Obstructive sleep apnea (adult) (pediatric): Secondary | ICD-10-CM | POA: Diagnosis not present

## 2023-01-01 DIAGNOSIS — I10 Essential (primary) hypertension: Secondary | ICD-10-CM

## 2023-01-02 ENCOUNTER — Telehealth: Payer: Self-pay | Admitting: Acute Care

## 2023-01-02 NOTE — Telephone Encounter (Signed)
Patient returned call. Patient states she already has a CT chest WO scheduled for 02/18/2023 and would like to keep this scan. Advised pt we could keep her on our follow up list since it has not been ordered by Kandice Robinsons NP. Will forward to Sarah as Lorain Childes.

## 2023-01-02 NOTE — Telephone Encounter (Signed)
Called and left VM for pt regarding 09/2022 LDCT. Patient's scan was resulted as a 12 month follow up scan (Lung Rads 2). However, because of hte growth of a nodule from 3.56mm to 20.60mm in 1 year Kandice Robinsons NP would like to do a short term follow up of 6 month scan, due in late 03/2023. Will await call back to review results.

## 2023-01-09 ENCOUNTER — Ambulatory Visit: Payer: PPO | Attending: Cardiology | Admitting: Cardiology

## 2023-01-09 ENCOUNTER — Other Ambulatory Visit (INDEPENDENT_AMBULATORY_CARE_PROVIDER_SITE_OTHER): Payer: PPO

## 2023-01-09 ENCOUNTER — Encounter: Payer: Self-pay | Admitting: Cardiology

## 2023-01-09 VITALS — BP 138/82 | HR 68 | Ht 65.0 in | Wt 211.8 lb

## 2023-01-09 DIAGNOSIS — I10 Essential (primary) hypertension: Secondary | ICD-10-CM | POA: Diagnosis not present

## 2023-01-09 DIAGNOSIS — I071 Rheumatic tricuspid insufficiency: Secondary | ICD-10-CM

## 2023-01-09 DIAGNOSIS — E559 Vitamin D deficiency, unspecified: Secondary | ICD-10-CM | POA: Diagnosis not present

## 2023-01-09 DIAGNOSIS — E785 Hyperlipidemia, unspecified: Secondary | ICD-10-CM

## 2023-01-09 DIAGNOSIS — R001 Bradycardia, unspecified: Secondary | ICD-10-CM

## 2023-01-09 DIAGNOSIS — Z6835 Body mass index (BMI) 35.0-35.9, adult: Secondary | ICD-10-CM | POA: Diagnosis not present

## 2023-01-09 LAB — LDL CHOLESTEROL, DIRECT: Direct LDL: 134 mg/dL

## 2023-01-09 LAB — HEPATIC FUNCTION PANEL
ALT: 18 U/L (ref 0–35)
AST: 14 U/L (ref 0–37)
Albumin: 4.3 g/dL (ref 3.5–5.2)
Alkaline Phosphatase: 56 U/L (ref 39–117)
Bilirubin, Direct: 0.1 mg/dL (ref 0.0–0.3)
Total Bilirubin: 0.9 mg/dL (ref 0.2–1.2)
Total Protein: 7.2 g/dL (ref 6.0–8.3)

## 2023-01-09 LAB — VITAMIN D 25 HYDROXY (VIT D DEFICIENCY, FRACTURES): VITD: 24.04 ng/mL — ABNORMAL LOW (ref 30.00–100.00)

## 2023-01-09 NOTE — Patient Instructions (Signed)

## 2023-01-09 NOTE — Progress Notes (Signed)
Cardiology Office Note:    Date:  01/09/2023   ID:  Linda Henson, DOB [birth year], MRN [id number]  PCP:  Glori Luis, MD    [facility] Providers Cardiologist:  Debbe Odea, MD     Referring MD: Glori Luis, MD   Chief Complaint  Patient presents with   Follow-up    Discuss cardiac testing results.  Patient denies new or acute cardiac problems/concerns today.      History of Present Illness:    Linda Henson is a 67 y.o. female with a hx of hypertension, former smoker x 7 years, OSA on CPAP, presenting for follow-up.  Previously seen due to l bradycardia with heart rates of 40 bpm occasionally.  Cardiac monitor was placed to evaluate any significant conduction abnormalities.  She is otherwise asymptomatic, denies dizziness, presyncope or syncope.  Denies chest pain, endorses shortness of breath which she attributes to obesity.  Working on cutting back on carbs, also working on reducing alcohol intake.  Drinks about 2 bourbons daily.  Prior notes Echo 10/22 EF 60 to 65%  Past Medical History:  Diagnosis Date   Arthritis    spine - mild   Colon polyp    Repeat colonoscopy 2018   COPD (chronic obstructive pulmonary disease) (HCC)    Diverticulitis    GERD (gastroesophageal reflux disease)    Resolved with wt loss   Hepatitis C 2018   treated with Harvoni   History of gastric ulcer    Hyperlipidemia    Borderline    Hypertension    Iron deficiency anemia 2014   Sleep apnea    CPAP    Past Surgical History:  Procedure Laterality Date   ABDOMINAL HYSTERECTOMY  1994   CATARACT EXTRACTION W/PHACO Right 08/30/2019   Procedure: CATARACT EXTRACTION PHACO AND INTRAOCULAR LENS PLACEMENT (IOC) RIGHT;  Surgeon: Galen Manila, MD;  Location: [facility];  Service: Ophthalmology;  Laterality: Right;  2.16 0:20.2   COLONOSCOPY N/A 08/19/2021   Procedure: COLONOSCOPY;  Surgeon: Jaynie Collins, DO;  Location: [facility];   Service: Gastroenterology;  Laterality: N/A;   COLONOSCOPY WITH PROPOFOL N/A 02/05/2015   Procedure: COLONOSCOPY WITH PROPOFOL;  Surgeon: Elnita Maxwell, MD;  Location[facility];  Service: Endoscopy;  Laterality: N/A;   EYE SURGERY     SALPINGOOPHORECTOMY Right 1994    Current Medications: Current Meds  Medication Sig   Calcium Carb-Cholecalciferol (CALCIUM 500 + D PO) Take 1 tablet by mouth daily.   Cholecalciferol (VITAMIN D3) 50 MCG (2000 UT) CAPS Take 1 capsule by mouth daily.   clobetasol ointment (TEMOVATE) 0.05 % Apply 1 Application topically 2 (two) times daily. Apply to aa's BID PRN flares.   pantoprazole (PROTONIX) 40 MG tablet Take 1 tablet (40 mg total) by mouth daily.   traZODone (DESYREL) 50 MG tablet TAKE 1/2 TO 1 TABLET(25 TO 50 MG) BY MOUTH AT BEDTIME AS NEEDED FOR SLEEP   valsartan (DIOVAN) 160 MG tablet TAKE 1 TABLET(160 MG) BY MOUTH DAILY     Allergies:   Patient has no known allergies.   Social History   Socioeconomic History   Marital status: Widowed    Spouse name: Not on file   Number of children: 1   Years of education: 14   Highest education level: Some college, no degree  Occupational History   Occupation: Dedicated Museum/gallery conservator: [employer]    Comment: [employer]  Tobacco Use   Smoking status: Former  Current packs/day: 0.00    Types: Cigarettes    Start date: 12/15/1988    Quit date: 12/15/1992    Years since quitting: 30.0   Smokeless tobacco: Never   Tobacco comments:    Smoked a pack per week.  Vaping Use   Vaping status: Never Used  Substance and Sexual Activity   Alcohol use: Yes    Alcohol/week: 14.0 standard drinks of alcohol    Types: 14 Standard drinks or equivalent per week   Drug use: No   Sexual activity: Not Currently    Birth control/protection: Surgical  Other Topics Concern   Not on file  Social History Narrative   Linda Henson grew up in [place], [place]. She is widowed for 5 years. She lives at home  with her 3 year old mother. 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 She enjoys playing golf.    Social Determinants of Health   Financial Resource Strain: Low Risk  (09/25/2022)   Overall Financial Resource Strain (CARDIA)    Difficulty of Paying Living Expenses: Not hard at all  Food Insecurity: No Food Insecurity (09/25/2022)   Hunger Vital Sign    Worried About Running Out of Food in the Last Year: Never true    Ran Out of Food in the Last Year: Never true  Transportation Needs: No Transportation Needs (09/25/2022)   PRAPARE - Administrator, Civil Service (Medical): No    Lack of Transportation (Non-Medical): No  Physical Activity: Sufficiently Active (09/25/2022)   Exercise Vital Sign    Days of Exercise per Week: 7 days    Minutes of Exercise per Session: 30 min  Stress: No Stress Concern Present (09/25/2022)   [employer] of Occupational Health - Occupational Stress Questionnaire    Feeling of Stress : Not at all  Social Connections: Moderately Isolated (09/25/2022)   Social Connection and Isolation Panel [NHANES]    Frequency of Communication with Friends and Family: More than three times a week    Frequency of Social Gatherings with Friends and Family: Once a week    Attends Religious Services: 1 to 4 times per year    Active Member of [employer] or Organizations: No    Attends Banker Meetings: Not on file    Marital Status: Widowed     Family History: The patient's family history includes Cancer (age of onset: 18) in her brother; Diabetes in her paternal grandmother; Heart disease (age of onset: 11) in her father; Hyperlipidemia in her father; Hypothyroidism in her sister. There is no history of Breast cancer.  ROS:   Please see the history of present illness.     All other systems reviewed and are negative.  EKGs/Labs/Other Studies Reviewed:    The following studies were reviewed today:        Recent Labs: 09/29/2022: ALT 17; BUN 18; Creatinine, Ser 0.91; Hemoglobin 14.0; Platelets 185.0; Potassium 4.0; Sodium 136; TSH 1.69  Recent Lipid Panel    Component Value Date/Time   CHOL 126 02/17/2022 1013   TRIG 123.0 02/17/2022 1013   HDL 58.90 02/17/2022 1013   CHOLHDL 2 02/17/2022 1013   VLDL 24.6 02/17/2022 1013   LDLCALC 42 02/17/2022 1013   LDLDIRECT 55.0 12/05/2020 1548     Risk Assessment/Calculations:             Physical Exam:    VS:  BP 138/82 (BP Location: Left Arm, Patient Position: Sitting, Cuff  Size: Normal)   Pulse 68   Ht 5\' 5"  (1.651 m)   Wt 211 lb 12.8 oz (96.1 kg)   SpO2 96%   BMI 35.25 kg/m     Wt Readings from Last 3 Encounters:  01/09/23 211 lb 12.8 oz (96.1 kg)  11/27/22 211 lb (95.7 kg)  10/16/22 210 lb 6.4 oz (95.4 kg)     GEN:  Well nourished, well developed in no acute distress HEENT: Normal NECK: No JVD; No carotid bruits CARDIAC: RRR, 1/6 systolic murmur loudest at right sternal border RESPIRATORY:  Clear to auscultation without rales, wheezing or rhonchi  ABDOMEN: Soft, non-tender, distended MUSCULOSKELETAL:  No edema; No deformity  SKIN: Warm and dry NEUROLOGIC:  Alert and oriented x 3 PSYCHIATRIC:  Normal affect   ASSESSMENT:    1. Bradycardia   2. Mild tricuspid regurgitation   3. Primary hypertension   4. BMI 35.0-35.9,adult     PLAN:    In order of problems listed above:  History of bradycardia, asymptomatic.  Cardiac monitor with no high degree AV block or sinus pauses.  Average heart rate 63.  Heart rate 68 today.  Continue to monitor. Systolic murmur, echo with normal systolic function EF 60 to 65%, mild tricuspid regurgitation, no significant structural abnormalities. Hypertension, BP controlled.  Continue valsartan 160 mg daily. Obesity, low-calorie diet, reduced alcohol consumption advised.  Follow-up yearly.      Medication Adjustments/Labs and Tests Ordered: Current medicines are reviewed at length  with the patient today.  Concerns regarding medicines are outlined above.  No orders of the defined types were placed in this encounter.  No orders of the defined types were placed in this encounter.   Patient Instructions  Medication Instructions:   Your physician recommends that you continue on your current medications as directed. Please refer to the Current Medication list given to you today.  *If you need a refill on your cardiac medications before your next appointment, please call your pharmacy*   Lab Work:  None Ordered  If you have labs (blood work) drawn today and your tests are completely normal, you will receive your results only by: MyChart Message (if you have MyChart) OR A paper copy in the mail If you have any lab test that is abnormal or we need to change your treatment, we will call you to review the results.   Testing/Procedures:  None Ordered   Follow-Up: At [facility], you and your health needs are our priority.  As part of our continuing mission to provide you with exceptional heart care, we have created designated Provider Care Teams.  These Care Teams include your primary Cardiologist (physician) and Advanced Practice Providers (APPs -  Physician Assistants and Nurse Practitioners) who all work together to provide you with the care you need, when you need it.  We recommend signing up for the patient portal called "MyChart".  Sign up information is provided on this After Visit Summary.  MyChart is used to connect with patients for Virtual Visits (Telemedicine).  Patients are able to view lab/test results, encounter notes, upcoming appointments, etc.  Non-urgent messages can be sent to your provider as well.   To learn more about what you can do with MyChart, go to [email or web address].    Your next appointment:   12 month(s)  Provider:   You may see Debbe Odea, MD or one of the following Advanced Practice Providers on your designated  Care Team:   Nicolasa Ducking, NP Eula Listen, PA-C  Cadence Fransico Michael, PA-C Charlsie Quest, NP   Signed, Debbe Odea, MD  01/09/2023 10:17 AM    [facility]

## 2023-01-12 DIAGNOSIS — R293 Abnormal posture: Secondary | ICD-10-CM | POA: Diagnosis not present

## 2023-01-12 DIAGNOSIS — M9902 Segmental and somatic dysfunction of thoracic region: Secondary | ICD-10-CM | POA: Diagnosis not present

## 2023-01-12 DIAGNOSIS — M9903 Segmental and somatic dysfunction of lumbar region: Secondary | ICD-10-CM | POA: Diagnosis not present

## 2023-01-12 DIAGNOSIS — M62830 Muscle spasm of back: Secondary | ICD-10-CM | POA: Diagnosis not present

## 2023-01-13 ENCOUNTER — Ambulatory Visit: Payer: PPO | Admitting: Dermatology

## 2023-01-13 DIAGNOSIS — L900 Lichen sclerosus et atrophicus: Secondary | ICD-10-CM | POA: Diagnosis not present

## 2023-01-13 MED ORDER — CLOBETASOL PROPIONATE 0.05 % EX OINT: 1.0000 | TOPICAL_OINTMENT | Freq: Two times a day (BID) | CUTANEOUS | 1 refills | Status: AC

## 2023-01-13 NOTE — Patient Instructions (Signed)

## 2023-01-13 NOTE — Progress Notes (Unsigned)
   Follow-Up Visit   Subjective  Linda Henson is a 67 y.o. female who presents for the following: Lichen Sclerosus et Atrophicus of the vaginal area. She is using clobetasol ointment once daily x 1 week since currently flared. Before this flare, patient was using clobetasol ointment about once a month.  Overall, she has been controlled with minimal symptoms with current regimen.  The following portions of the chart were reviewed this encounter and updated as appropriate: medications, allergies, medical history  Review of Systems:  No other skin or systemic complaints except as noted in HPI or Assessment and Plan.  Objective  Well appearing patient in no apparent distress; mood and affect are within normal limits.  A focused examination was performed of the following areas: Face, groin  Relevant physical exam findings are noted in the Assessment and Plan.    Assessment & Plan   Lichen sclerosus et atrophicus  Related Medications triamcinolone ointment (KENALOG) 0.1 % Apply to affected areas twice a week  clobetasol ointment (TEMOVATE) 0.05 % Apply 1 Application topically 2 (two) times daily. Apply to aa's BID PRN flares.  LICHEN SCLEROSUS ET ATROPHICUS Exam: mottled hypo/hyperpigmentation at labia minora. Bright erythema at L upper labia minora  Chronic and persistent condition with duration or expected duration over one year. Condition is bothersome/symptomatic for patient. Currently flared.  Lichen sclerosus is a chronic inflammatory condition of unknown cause that frequently involves the vaginal area and less commonly extragenital skin, and is NOT sexually transmitted. It frequently causes symptoms of pain and burning.  It requires regular monitoring and treatment with topical steroids to minimize inflammation and to reduce risk of scarring. There is also a risk of cancer in the vaginal area which is very low if inflammation is well controlled. Regular checks of the area  are recommended. Please call if you notice any new or changing spots within this area.  Treatment Plan: Increase Clobetasol ointment twice daily with flares until symptoms resolved. May gradually taper down to 2-3 times a week to keep symptoms tolerable. Avoid skin folds.   Topical steroids (such as triamcinolone, fluocinolone, fluocinonide, mometasone, clobetasol, halobetasol, betamethasone, hydrocortisone) can cause thinning and lightening of the skin if they are used for too long in the same area. Your physician has selected the right strength medicine for your problem and area affected on the body. Please use your medication only as directed by your physician to prevent side effects.     Return in about 6 months (around 07/14/2023) for LS&A.  ICherlyn Labella, CMA, am acting as scribe for Willeen Niece, MD .   Documentation: I have reviewed the above documentation for accuracy and completeness, and I agree with the above.  Willeen Niece, MD

## 2023-01-14 DIAGNOSIS — G4733 Obstructive sleep apnea (adult) (pediatric): Secondary | ICD-10-CM | POA: Diagnosis not present

## 2023-01-15 ENCOUNTER — Telehealth: Payer: Self-pay

## 2023-01-15 ENCOUNTER — Other Ambulatory Visit: Payer: Self-pay | Admitting: Family Medicine

## 2023-01-15 DIAGNOSIS — E559 Vitamin D deficiency, unspecified: Secondary | ICD-10-CM

## 2023-01-15 DIAGNOSIS — E785 Hyperlipidemia, unspecified: Secondary | ICD-10-CM

## 2023-01-15 DIAGNOSIS — R293 Abnormal posture: Secondary | ICD-10-CM | POA: Diagnosis not present

## 2023-01-15 DIAGNOSIS — M9903 Segmental and somatic dysfunction of lumbar region: Secondary | ICD-10-CM | POA: Diagnosis not present

## 2023-01-15 DIAGNOSIS — M9902 Segmental and somatic dysfunction of thoracic region: Secondary | ICD-10-CM | POA: Diagnosis not present

## 2023-01-15 DIAGNOSIS — M62830 Muscle spasm of back: Secondary | ICD-10-CM | POA: Diagnosis not present

## 2023-01-15 MED ORDER — PRAVASTATIN SODIUM 20 MG PO TABS
20.0000 mg | ORAL_TABLET | Freq: Every day | ORAL | 1 refills | Status: DC
Start: 2023-01-15 — End: 2023-06-03

## 2023-01-15 NOTE — Telephone Encounter (Signed)
-----   Message from Marikay Alar sent at 01/15/2023 11:46 AM EDT ----- Noted.  We can recheck vitamin D in 6 weeks.  I have sent pravastatin for her to start on for her cholesterol.  I have placed orders for her cholesterol be rechecked in 6 weeks as well.  Please make sure she gets scheduled for this.  Thanks.

## 2023-01-15 NOTE — Telephone Encounter (Signed)
Left detail vm for pt. Also schedule lab appt for pt

## 2023-01-19 DIAGNOSIS — M9903 Segmental and somatic dysfunction of lumbar region: Secondary | ICD-10-CM | POA: Diagnosis not present

## 2023-01-19 DIAGNOSIS — M9902 Segmental and somatic dysfunction of thoracic region: Secondary | ICD-10-CM | POA: Diagnosis not present

## 2023-01-19 DIAGNOSIS — R293 Abnormal posture: Secondary | ICD-10-CM | POA: Diagnosis not present

## 2023-01-19 DIAGNOSIS — M62830 Muscle spasm of back: Secondary | ICD-10-CM | POA: Diagnosis not present

## 2023-02-01 DIAGNOSIS — G4733 Obstructive sleep apnea (adult) (pediatric): Secondary | ICD-10-CM | POA: Diagnosis not present

## 2023-02-18 ENCOUNTER — Ambulatory Visit
Admission: RE | Admit: 2023-02-18 | Discharge: 2023-02-18 | Disposition: A | Discharge status: Home / Self Care | Payer: PPO | Source: Ambulatory Visit | Attending: Internal Medicine | Admitting: Internal Medicine

## 2023-02-18 DIAGNOSIS — K449 Diaphragmatic hernia without obstruction or gangrene: Secondary | ICD-10-CM | POA: Diagnosis not present

## 2023-02-18 DIAGNOSIS — R918 Other nonspecific abnormal finding of lung field: Secondary | ICD-10-CM | POA: Insufficient documentation

## 2023-02-26 ENCOUNTER — Other Ambulatory Visit (INDEPENDENT_AMBULATORY_CARE_PROVIDER_SITE_OTHER): Payer: PPO

## 2023-02-26 DIAGNOSIS — E559 Vitamin D deficiency, unspecified: Secondary | ICD-10-CM

## 2023-02-26 DIAGNOSIS — E785 Hyperlipidemia, unspecified: Secondary | ICD-10-CM

## 2023-02-26 LAB — HEPATIC FUNCTION PANEL
ALT: 22 U/L (ref 0–35)
AST: 17 U/L (ref 0–37)
Albumin: 4.4 g/dL (ref 3.5–5.2)
Alkaline Phosphatase: 59 U/L (ref 39–117)
Bilirubin, Direct: 0.1 mg/dL (ref 0.0–0.3)
Total Bilirubin: 0.7 mg/dL (ref 0.2–1.2)
Total Protein: 7.3 g/dL (ref 6.0–8.3)

## 2023-02-26 LAB — LDL CHOLESTEROL, DIRECT: Direct LDL: 110 mg/dL

## 2023-02-26 LAB — VITAMIN D 25 HYDROXY (VIT D DEFICIENCY, FRACTURES): VITD: 25.37 ng/mL — ABNORMAL LOW (ref 30.00–100.00)

## 2023-03-03 DIAGNOSIS — G4733 Obstructive sleep apnea (adult) (pediatric): Secondary | ICD-10-CM | POA: Diagnosis not present

## 2023-03-05 NOTE — Telephone Encounter (Signed)
Sarah, please advise if you would like to order a LDCT follow up scan. Thanks.    IMPRESSION: 1. Previous ground-glass opacity in the inferior right upper lobe has resolved. There are 2 new ground-glass opacities in the right upper lobe measuring 6 mm and 9 mm. These are likely infectious/inflammatory. Non-contrast chest CT at 3-6 months is recommended. If the nodules are stable at time of repeat CT, then future CT at 18-24 months (from today's scan) is considered optional for low-risk patients, but is recommended for high-risk patients. This recommendation follows the consensus statement: Guidelines for Management of Incidental Pulmonary Nodules Detected on CT Images: From the Fleischner Society 2017; Radiology 2017; 284:228-243. 2. Peripheral subpleural reticular and ground-glass opacities bilaterally compatible with fibrosis.     Electronically Signed   By: Minerva Fester M.D.   On: 03/01/2023 20:30

## 2023-03-18 DIAGNOSIS — G4733 Obstructive sleep apnea (adult) (pediatric): Secondary | ICD-10-CM | POA: Diagnosis not present

## 2023-03-25 ENCOUNTER — Telehealth: Payer: Self-pay | Admitting: Acute Care

## 2023-03-25 NOTE — Telephone Encounter (Signed)
 Dr. Isaiah, You ordered this patient's Ct Chest, it was done 02/18/2023. It was read 03/01/2023. The patient has waxing and waning lung nodules. The previously seen nodule had cleared, and now  there are 2 new ground-glass opacities in the right upper lobe measuring 6 mm and 9 mm. These are likely infectious/inflammatory. Recommendation is for a 3-6 month follow up CT Chest.  We follow the patient for lung cancer screening. We can order the 3 month follow up scan for March 2025. .  I wanted to make you aware of the findings in case you wanted to treat this finding. Thanks so much.

## 2023-03-25 NOTE — Telephone Encounter (Signed)
 Let's do a 3 month follow up on this patient. Will be due beginning of March 2025. Thanks so much

## 2023-03-26 ENCOUNTER — Other Ambulatory Visit: Payer: Self-pay

## 2023-03-26 DIAGNOSIS — R911 Solitary pulmonary nodule: Secondary | ICD-10-CM

## 2023-03-26 DIAGNOSIS — Z87891 Personal history of nicotine dependence: Secondary | ICD-10-CM

## 2023-03-26 DIAGNOSIS — Z122 Encounter for screening for malignant neoplasm of respiratory organs: Secondary | ICD-10-CM

## 2023-03-26 NOTE — Telephone Encounter (Signed)
Order placed for 3 month f/u.

## 2023-03-26 NOTE — Telephone Encounter (Signed)
 3 Month F/U scan order placed for 05/2023

## 2023-03-27 ENCOUNTER — Other Ambulatory Visit: Payer: Self-pay

## 2023-03-27 NOTE — Telephone Encounter (Signed)
 Called and left VM for pt to review the results and recommendations.

## 2023-03-27 NOTE — Telephone Encounter (Signed)
 Spoke with patient and reviewed results. 3 month follow up scan scheduled for 06/01/2023 at [facility] per patient request. No questions.

## 2023-04-03 ENCOUNTER — Encounter: Payer: Self-pay | Admitting: Family Medicine

## 2023-04-03 ENCOUNTER — Ambulatory Visit (INDEPENDENT_AMBULATORY_CARE_PROVIDER_SITE_OTHER): Payer: PPO | Admitting: Family Medicine

## 2023-04-03 VITALS — BP 130/74 | HR 55 | Temp 97.8°F | Ht 65.0 in | Wt 210.4 lb

## 2023-04-03 DIAGNOSIS — M791 Myalgia, unspecified site: Secondary | ICD-10-CM | POA: Diagnosis not present

## 2023-04-03 DIAGNOSIS — E559 Vitamin D deficiency, unspecified: Secondary | ICD-10-CM | POA: Diagnosis not present

## 2023-04-03 DIAGNOSIS — M7918 Myalgia, other site: Secondary | ICD-10-CM | POA: Diagnosis not present

## 2023-04-03 DIAGNOSIS — J439 Emphysema, unspecified: Secondary | ICD-10-CM | POA: Diagnosis not present

## 2023-04-03 DIAGNOSIS — E785 Hyperlipidemia, unspecified: Secondary | ICD-10-CM

## 2023-04-03 DIAGNOSIS — G4733 Obstructive sleep apnea (adult) (pediatric): Secondary | ICD-10-CM | POA: Diagnosis not present

## 2023-04-03 NOTE — Progress Notes (Signed)
Marikay Alar, MD Phone: [phone]  Linda Henson is a 68 y.o. female who presents today for f/u.  Hyperlipidemia: Patient recently started on the pravastatin 20 mg about 2 weeks ago.  Notes initially she had some myalgias though does not have any now.  COPD: Patient notes no cough or chest congestion.  Shortness of breath is stable.  She does follow with pulmonology.  She has been having CT scans to follow-up on lung nodules and groundglass opacities.  She wonders if she needs to be treated for infection given what was seen on her recent CT scan.  Gluteal pain: Patient notes some gluteal cleft pain on the left side.  Has been present for a couple of weeks.  No blood in her stool.  Does report a history of hemorrhoids.  Social History   Tobacco Use  Smoking Status Former   Current packs/day: 0.00   Types: Cigarettes   Start date: 12/15/1988   Quit date: 12/15/1992   Years since quitting: 30.3  Smokeless Tobacco Never  Tobacco Comments   Smoked a pack per week.    Current Outpatient Medications on File Prior to Visit  Medication Sig Dispense Refill   Calcium Carb-Cholecalciferol (CALCIUM 500 + D PO) Take 1 tablet by mouth daily.     Cholecalciferol (VITAMIN D3) 50 MCG (2000 UT) CAPS Take 1 capsule by mouth daily.     clobetasol ointment (TEMOVATE) 0.05 % Apply 1 Application topically 2 (two) times daily. Apply to aa's BID PRN flares. 60 g 1   pantoprazole (PROTONIX) 40 MG tablet Take 1 tablet (40 mg total) by mouth daily. 90 tablet 1   pravastatin (PRAVACHOL) 20 MG tablet Take 1 tablet (20 mg total) by mouth daily. 90 tablet 1   traZODone (DESYREL) 50 MG tablet TAKE 1/2 TO 1 TABLET(25 TO 50 MG) BY MOUTH AT BEDTIME AS NEEDED FOR SLEEP 30 tablet 3   triamcinolone ointment (KENALOG) 0.1 % Apply to affected areas twice a week 80 g 2   valsartan (DIOVAN) 160 MG tablet TAKE 1 TABLET(160 MG) BY MOUTH DAILY 90 tablet 3   No current facility-administered medications on file prior  to visit.     ROS see history of present illness  Objective  Physical Exam Vitals:   04/03/23 0810  BP: 130/74  Pulse: (!) 55  Temp: 97.8 F (36.6 C)  SpO2: 95%    BP Readings from Last 3 Encounters:  04/03/23 130/74  01/09/23 138/82  11/27/22 134/82   Wt Readings from Last 3 Encounters:  04/03/23 210 lb 6.4 oz (95.4 kg)  01/09/23 211 lb 12.8 oz (96.1 kg)  11/27/22 211 lb (95.7 kg)    Physical Exam Constitutional:      General: She is not in acute distress.    Appearance: She is not diaphoretic.  Cardiovascular:     Rate and Rhythm: Regular rhythm. Bradycardia present.     Heart sounds: Normal heart sounds.  Pulmonary:     Effort: Pulmonary effort is normal.     Breath sounds: Normal breath sounds.  Genitourinary:    Comments: CMA served as Biomedical engineer, patient does have evidence of noninflamed hemorrhoids Skin:    General: Skin is warm and dry.       Neurological:     Mental Status: She is alert.      Assessment/Plan: Please see individual problem list.  Hyperlipidemia, unspecified hyperlipidemia type Assessment & Plan: Chronic issue.  Patient will continue pravastatin 20 mg daily.  Discussed the potential  for trying Repatha if she has recurrent myalgias.  Check labs as planned in February.  Orders: -     Hepatic function panel; Future -     LDL cholesterol, direct; Future -     Basic metabolic panel; Future  Myalgia Assessment & Plan: Chronic issue.  Improved at this time.  Monitor for recurrence.   Vitamin D deficiency -     VITAMIN D 25 Hydroxy (Vit-D Deficiency, Fractures); Future  Pulmonary emphysema, unspecified emphysema type (HCC) Assessment & Plan: Chronic issue.  Shortness of breath is generally stable.  Discussed given lack of symptoms she does not need treatment for infection at this time.  Discussed that her pulmonologist appears to be aware of the CT imaging findings based on a phone note.  She should continue to follow-up with  pulmonology.  She should have her follow-up CT imaging as planned.   Gluteal pain Assessment & Plan: Suspect strain of the muscles in the area of her discomfort.  Advised sitting on a pillow or a doughnut pillow.  She will let us know if not improving.     Return in about 6 months (around 10/01/2023) for transfer of care.   Marikay Alar, MD [facility]

## 2023-04-03 NOTE — Assessment & Plan Note (Signed)
Chronic issue.  Shortness of breath is generally stable.  Discussed given lack of symptoms she does not need treatment for infection at this time.  Discussed that her pulmonologist appears to be aware of the CT imaging findings based on a phone note.  She should continue to follow-up with pulmonology.  She should have her follow-up CT imaging as planned.

## 2023-04-03 NOTE — Assessment & Plan Note (Signed)
Chronic issue.  Improved at this time.  Monitor for recurrence.

## 2023-04-03 NOTE — Assessment & Plan Note (Signed)
Suspect strain of the muscles in the area of her discomfort.  Advised sitting on a pillow or a doughnut pillow.  She will let us know if not improving.

## 2023-04-03 NOTE — Patient Instructions (Signed)
Nice to see you. Please try to sit on very cushioned materials when seated.  You can also buy a donut pillow that may be beneficial. If you develop signs of lung infection such as cough, congestion, or fevers please let us or pulmonology know. If you start to have muscle aches with the pravastatin please let us know.

## 2023-04-03 NOTE — Assessment & Plan Note (Signed)
Chronic issue.  Patient will continue pravastatin 20 mg daily.  Discussed the potential for trying Repatha if she has recurrent myalgias.  Check labs as planned in February.

## 2023-04-08 ENCOUNTER — Ambulatory Visit: Payer: PPO | Admitting: *Deleted

## 2023-04-08 ENCOUNTER — Other Ambulatory Visit: Payer: Self-pay | Admitting: Family Medicine

## 2023-04-08 ENCOUNTER — Telehealth: Payer: Self-pay | Admitting: Family Medicine

## 2023-04-08 VITALS — Ht 65.0 in | Wt 200.0 lb

## 2023-04-08 DIAGNOSIS — Z1231 Encounter for screening mammogram for malignant neoplasm of breast: Secondary | ICD-10-CM

## 2023-04-08 DIAGNOSIS — Z0000 Encounter for general adult medical examination without abnormal findings: Secondary | ICD-10-CM

## 2023-04-08 NOTE — Patient Instructions (Addendum)
Linda Henson , Thank you for taking time to come for your Medicare Wellness Visit. I appreciate your ongoing commitment to your health goals. Please review the following plan we discussed and let me know if I can assist you in the future.   Referrals/Orders/Follow-Ups/Clinician Recommendations: Message was sent to your provider to make sure that the correct Mammogram is ordered for you this year.  This is a list of the screening recommended for you and due dates:  Health Maintenance  Topic Date Due   Pneumonia Vaccine (1 of 2 - PCV) 04/03/1961   COVID-19 Vaccine (4 - 2024-25 season) 11/16/2022   Flu Shot  06/15/2023*   Mammogram  03/24/2024   Medicare Annual Wellness Visit  04/07/2024   Colon Cancer Screening  08/20/2026   DTaP/Tdap/Td vaccine (2 - Td or Tdap) 01/30/2028   DEXA scan (bone density measurement)  Completed   Hepatitis C Screening  Completed   Zoster (Shingles) Vaccine  Completed   HPV Vaccine  Aged Out  *Topic was postponed. The date shown is not the original due date.    Advanced directives: (Declined) Advance directive discussed with you today. Even though you declined this today, please call our office should you change your mind, and we can give you the proper paperwork for you to fill out.  Next Medicare Annual Wellness Visit scheduled for next year: Yes 04/11/24 @ 8:10

## 2023-04-08 NOTE — Progress Notes (Signed)
Subjective:   Linda Henson is a 68 y.o. female who presents for an Initial Medicare Annual Wellness Visit.  Visit Complete: Virtual I connected with  Linda Henson on 04/08/23 by a audio enabled telemedicine application and verified that I am speaking with the correct person using two identifiers.This patient declined Interactive audio and Acupuncturist. Therefore the visit was completed with audio only.   Patient Location: Home  Provider Location: Home Office  I discussed the limitations of evaluation and management by telemedicine. The patient expressed understanding and agreed to proceed.  Vital Signs: Because this visit was a virtual/telehealth visit, some criteria may be missing or patient reported. Any vitals not documented were not able to be obtained and vitals that have been documented are patient reported.  Patient Medicare AWV questionnaire was completed by the patient on 04/08/23; I have confirmed that all information answered by patient is correct and no changes since this date.  Cardiac Risk Factors include: advanced age (>45men, >63 women);hypertension;dyslipidemia;obesity (BMI >30kg/m2)     Objective:    Today's Vitals   04/08/23 0857  Weight: 200 lb (90.7 kg)  Height: 5\' 5"  (1.651 m)   Body mass index is 33.28 kg/m.     04/08/2023    9:07 AM 08/19/2021    8:17 AM 08/30/2019    9:57 AM  Advanced Directives  Does Patient Have a Medical Advance Directive? No No No  Would patient like information on creating a medical advance directive? No - Patient declined  No - Patient declined    Current Medications (verified) Outpatient Encounter Medications as of 04/08/2023  Medication Sig   Calcium Carb-Cholecalciferol (CALCIUM 500 + D PO) Take 1 tablet by mouth daily.   Cholecalciferol (VITAMIN D3) 50 MCG (2000 UT) CAPS Take 1 capsule by mouth daily.   clobetasol ointment (TEMOVATE) 0.05 % Apply 1 Application topically 2 (two) times daily. Apply to  aa's BID PRN flares.   pantoprazole (PROTONIX) 40 MG tablet Take 1 tablet (40 mg total) by mouth daily.   pravastatin (PRAVACHOL) 20 MG tablet Take 1 tablet (20 mg total) by mouth daily.   traZODone (DESYREL) 50 MG tablet TAKE 1/2 TO 1 TABLET(25 TO 50 MG) BY MOUTH AT BEDTIME AS NEEDED FOR SLEEP   triamcinolone ointment (KENALOG) 0.1 % Apply to affected areas twice a week   valsartan (DIOVAN) 160 MG tablet TAKE 1 TABLET(160 MG) BY MOUTH DAILY   No facility-administered encounter medications on file as of 04/08/2023.    Allergies (verified) Patient has no known allergies.   History: Past Medical History:  Diagnosis Date   Arthritis    spine - mild   Colon polyp    Repeat colonoscopy 2018   COPD (chronic obstructive pulmonary disease) (HCC)    Diverticulitis    GERD (gastroesophageal reflux disease)    Resolved with wt loss   Hepatitis C 2018   treated with Harvoni   History of gastric ulcer    Hyperlipidemia    Borderline    Hypertension    Iron deficiency anemia 2014   Sleep apnea    CPAP   Past Surgical History:  Procedure Laterality Date   ABDOMINAL HYSTERECTOMY  1994   CATARACT EXTRACTION W/PHACO Right 08/30/2019   Procedure: CATARACT EXTRACTION PHACO AND INTRAOCULAR LENS PLACEMENT (IOC) RIGHT;  Surgeon: Galen Manila, MD;  [facility]: [facility];  Service: Ophthalmology;  Laterality: Right;  2.16 0:20.2   COLONOSCOPY N/A 08/19/2021   Procedure: COLONOSCOPY;  Surgeon: Elfredia Nevins  Casimiro Needle, DO;  Location: [facility];  Service: Gastroenterology;  Laterality: N/A;   COLONOSCOPY WITH PROPOFOL N/A 02/05/2015   Procedure: COLONOSCOPY WITH PROPOFOL;  Surgeon: Elnita Maxwell, MD;  Location: [facility];  Service: Endoscopy;  Laterality: N/A;   EYE SURGERY     SALPINGOOPHORECTOMY Right 1994   Family History  Problem Relation Age of Onset   Heart disease Father 76       CAD - died of MI   Hyperlipidemia Father    Cancer Brother 28       stomach and  esophageal cancer   Hypothyroidism Sister    Diabetes Paternal Grandmother    Breast cancer Neg Hx    Social History   Socioeconomic History   Marital status: Widowed    Spouse name: Not on file   Number of children: 1   Years of education: 14   Highest education level: Some college, no degree  Occupational History   Occupation: Dedicated Museum/gallery conservator: [employer]    Comment: [employer]  Tobacco Use   Smoking status: Former    Current packs/day: 0.00    Types: Cigarettes    Start date: 12/15/1988    Quit date: 12/15/1992    Years since quitting: 30.3   Smokeless tobacco: Never   Tobacco comments:    Smoked a pack per week.  Vaping Use   Vaping status: Never Used  Substance and Sexual Activity   Alcohol use: Yes    Alcohol/week: 14.0 standard drinks of alcohol    Types: 14 Standard drinks or equivalent per week   Drug use: No   Sexual activity: Not Currently    Birth control/protection: Surgical  Other Topics Concern   Not on file  Social History Narrative   Linda Henson grew up in [place], [place]. She is widowed for 5 years. She lives at home with her 9 year old mother. 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 She enjoys playing golf.    Social Drivers of Corporate investment banker Strain: Low Risk  (04/08/2023)   Overall Financial Resource Strain (CARDIA)    Difficulty of Paying Living Expenses: Not very hard  Food Insecurity: No Food Insecurity (04/08/2023)   Hunger Vital Sign    Worried About Running Out of Food in the Last Year: Never true    Ran Out of Food in the Last Year: Never true  Transportation Needs: No Transportation Needs (04/08/2023)   PRAPARE - Administrator, Civil Service (Medical): No    Lack of Transportation (Non-Medical): No  Physical Activity: Insufficiently Active (04/08/2023)   Exercise Vital Sign    Days of Exercise per Week: 2 days    Minutes of Exercise per Session: 30 min   Stress: Stress Concern Present (04/08/2023)   [employer] of Occupational Health - Occupational Stress Questionnaire    Feeling of Stress : To some extent  Social Connections: Moderately Isolated (04/08/2023)   Social Connection and Isolation Panel [NHANES]    Frequency of Communication with Friends and Family: More than three times a week    Frequency of Social Gatherings with Friends and Family: Once a week    Attends Religious Services: 1 to 4 times per year    Active Member of [employer] or Organizations: No    Attends Banker Meetings: Not on file    Marital Status: Widowed    Tobacco Counseling Counseling given: Not Answered  Tobacco comments: Smoked a pack per week.   Clinical Intake:  Pre-visit preparation completed: Yes  Pain : No/denies pain     BMI - recorded: 33.28 Nutritional Status: BMI > 30  Obese Nutritional Risks: None Diabetes: No  How often do you need to have someone help you when you read instructions, pamphlets, or other written materials from your doctor or pharmacy?: 1 - Never  Interpreter Needed?: No  Information entered by :: R. Pixie Burgener LPN   Activities of Daily Living    04/08/2023    7:22 AM  In your present state of health, do you have any difficulty performing the following activities:  Hearing? 0  Vision? 0  Difficulty concentrating or making decisions? 0  Walking or climbing stairs? 0  Dressing or bathing? 0  Doing errands, shopping? 0  Preparing Food and eating ? N  Using the Toilet? N  In the past six months, have you accidently leaked urine? Y  Do you have problems with loss of bowel control? N  Managing your Medications? N  Managing your Finances? N  Housekeeping or managing your Housekeeping? N    Patient Care Team: Glori Luis, MD as PCP - General (Family Medicine) Debbe Odea, MD as PCP - Cardiology (Cardiology) Erin Fulling, MD as Consulting Physician (Pulmonary Disease)  Indicate any recent  Medical Services you may have received from other than [facility] providers in the past year (date may be approximate).     Assessment:   This is a routine wellness examination for Kaili.  Hearing/Vision screen Hearing Screening - Comments:: No issues Vision Screening - Comments:: readers   Goals Addressed             This Visit's Progress    Patient Stated       Wants to exercise regularly and eat healthier       Depression Screen    04/08/2023    9:03 AM 04/03/2023    8:13 AM 05/26/2022   11:42 AM 02/17/2022   10:04 AM 02/17/2022    9:28 AM 03/20/2021    7:39 AM 03/06/2021    8:13 AM  PHQ 2/9 Scores  PHQ - 2 Score 0 0 0 1 5 0 0  PHQ- 9 Score 1 1 2 8        Fall Risk    04/08/2023    7:22 AM 04/03/2023    8:12 AM 05/26/2022   11:42 AM 02/17/2022    9:28 AM 03/20/2021    7:40 AM  Fall Risk   Falls in the past year? 0 0 0 0 0  Number falls in past yr: 0 0 0 0 0  Injury with Fall? 0 0 0 0 0  Risk for fall due to : No Fall Risks No Fall Risks No Fall Risks No Fall Risks   Follow up Falls prevention discussed;Falls evaluation completed Falls prevention discussed;Falls evaluation completed Falls evaluation completed Falls evaluation completed Falls evaluation completed    MEDICARE RISK AT HOME: Medicare Risk at Home Any stairs in or around the home?: Yes If so, are there any without handrails?: No Home free of loose throw rugs in walkways, pet beds, electrical cords, etc?: (Patient-Rptd) No Adequate lighting in your home to reduce risk of falls?: (Patient-Rptd) Yes Life alert?: (Patient-Rptd) No Use of a cane, walker or w/c?: (Patient-Rptd) No Grab bars in the bathroom?: (Patient-Rptd) No Shower chair or bench in shower?: (Patient-Rptd) No Elevated toilet seat or a handicapped toilet?: (Patient-Rptd) No  Cognitive Function:        04/08/2023    9:07 AM  6CIT Screen  What Year? 0 points  What month? 0 points  What time? 0 points  Count back from 20 0 points  Months  in reverse 0 points  Repeat phrase 0 points  Total Score 0 points    Immunizations Immunization History  Administered Date(s) Administered   Influenza Inj Mdck Quad With Preservative 01/03/2018   Influenza Split 12/23/2013, 12/27/2019   Influenza,inj,Quad PF,6+ Mos 01/14/2013, 12/19/2016   Influenza-Unspecified 01/13/2016, 12/05/2016, 12/29/2017, 12/16/2018, 12/27/2020, 12/18/2021   Moderna Sars-Covid-2 Vaccination 05/26/2019, 06/23/2019   PFIZER(Purple Top)SARS-COV-2 Vaccination 01/27/2022   Pneumococcal-Unspecified 01/27/2022   Tdap 01/29/2018   Zoster Recombinant(Shingrix) 07/17/2016, 12/05/2016    TDAP status: Up to date  Flu Vaccine status: Up to date  Pneumococcal vaccine status: Due, Education has been provided regarding the importance of this vaccine. Advised may receive this vaccine at local pharmacy or Health Dept. Aware to provide a copy of the vaccination record if obtained from local pharmacy or Health Dept. Verbalized acceptance and understanding.  Covid-19 vaccine status: Information provided on how to obtain vaccines.   Qualifies for Shingles Vaccine? Yes   Zostavax completed No   Shingrix Completed?: Yes  Screening Tests Health Maintenance  Topic Date Due   Medicare Annual Wellness (AWV)  Never done   Pneumonia Vaccine 42+ Years old (1 of 2 - PCV) 04/03/1961   INFLUENZA VACCINE  10/16/2022   COVID-19 Vaccine (4 - 2024-25 season) 11/16/2022   MAMMOGRAM  03/24/2024   Colonoscopy  08/20/2026   DTaP/Tdap/Td (2 - Td or Tdap) 01/30/2028   DEXA SCAN  Completed   Hepatitis C Screening  Completed   Zoster Vaccines- Shingrix  Completed   HPV VACCINES  Aged Out    Health Maintenance  Health Maintenance Due  Topic Date Due   Medicare Annual Wellness (AWV)  Never done   Pneumonia Vaccine 13+ Years old (1 of 2 - PCV) 04/03/1961   INFLUENZA VACCINE  10/16/2022   COVID-19 Vaccine (4 - 2024-25 season) 11/16/2022    Colorectal cancer screening: Type of  screening: Colonoscopy. Completed 08/2021. Repeat every 5 years  Mammogram status Completed 03/24/2022 Had additional test  Bone Density status: Completed 04/2022. Results reflect: Bone density results: OSTEOPENIA. Repeat every 2 years.  Lung Cancer Screening: (Low Dose CT Chest recommended if Age 13-80 years, 20 pack-year currently smoking OR have quit w/in 15years.) does qualify. Does this through a study    Additional Screening:  Hepatitis C Screening: does qualify; Completed 01/2016  Vision Screening: Recommended annual ophthalmology exams for early detection of glaucoma and other disorders of the eye. Is the patient up to date with their annual eye exam?  Yes  Who is the provider or what is the name of the office in which the patient attends annual eye exams? [facility] If pt is not established with a provider, would they like to be referred to a provider to establish care? No .   Dental Screening: Recommended annual dental exams for proper oral hygiene  Community Resource Referral / Chronic Care Management: CRR required this visit?  No   CCM required this visit?  No     Plan:     I have personally reviewed and noted the following in the patient's chart:   Medical and social history Use of alcohol, tobacco or illicit drugs  Current medications and supplements including opioid prescriptions. Patient is not currently taking opioid prescriptions. Functional  ability and status Nutritional status Physical activity Advanced directives List of other physicians Hospitalizations, surgeries, and ER visits in previous 12 months Vitals Screenings to include cognitive, depression, and falls Referrals and appointments  In addition, I have reviewed and discussed with patient certain preventive protocols, quality metrics, and best practice recommendations. A written personalized care plan for preventive services as well as general preventive health recommendations were provided to  patient.     Sydell Axon, LPN   06/23/8117   After Visit Summary: (MyChart) Due to this being a telephonic visit, the after visit summary with patients personalized plan was offered to patient via MyChart   Nurse Notes: see routing comments

## 2023-04-08 NOTE — Telephone Encounter (Signed)
Please contact the patient and let her know I ordered a screening mammogram.  Her prior diagnostic mammogram revealed a benign cause and they recommended yearly screening mammogram.  I am not sure her insurance would pay for another diagnostic mammogram based on the findings from last year.

## 2023-04-08 NOTE — Telephone Encounter (Signed)
-----   Message from Nurse Cora Daniels sent at 04/08/2023 12:37 PM EST ----- AWV was performed on patient. Patient is due a mammogram and was uncertain if she should have a screening or a diagnostic since she had an abnormal one last year. Patient stated the final results were okay but she prefers to have another diagnostic. Can this be ordered and  patient contacted.

## 2023-04-09 NOTE — Telephone Encounter (Signed)
Pt notified. Pt has scheduled her mammogram already

## 2023-04-15 ENCOUNTER — Ambulatory Visit
Admission: RE | Admit: 2023-04-15 | Discharge: 2023-04-15 | Disposition: A | Discharge status: Home / Self Care | Payer: PPO | Source: Ambulatory Visit | Attending: Family Medicine | Admitting: Family Medicine

## 2023-04-15 DIAGNOSIS — Z1231 Encounter for screening mammogram for malignant neoplasm of breast: Secondary | ICD-10-CM | POA: Insufficient documentation

## 2023-04-16 DIAGNOSIS — G4733 Obstructive sleep apnea (adult) (pediatric): Secondary | ICD-10-CM | POA: Diagnosis not present

## 2023-05-01 ENCOUNTER — Other Ambulatory Visit (INDEPENDENT_AMBULATORY_CARE_PROVIDER_SITE_OTHER): Payer: PPO

## 2023-05-01 ENCOUNTER — Other Ambulatory Visit: Payer: Self-pay | Admitting: Family Medicine

## 2023-05-01 DIAGNOSIS — E785 Hyperlipidemia, unspecified: Secondary | ICD-10-CM

## 2023-05-01 DIAGNOSIS — E559 Vitamin D deficiency, unspecified: Secondary | ICD-10-CM | POA: Diagnosis not present

## 2023-05-01 LAB — BASIC METABOLIC PANEL
BUN: 16 mg/dL (ref 6–23)
CO2: 27 meq/L (ref 19–32)
Calcium: 9.6 mg/dL (ref 8.4–10.5)
Chloride: 101 meq/L (ref 96–112)
Creatinine, Ser: 0.86 mg/dL (ref 0.40–1.20)
GFR: 69.58 mL/min (ref >60.00)
Glucose, Bld: 164 mg/dL — ABNORMAL HIGH (ref 70–99)
Potassium: 4.2 meq/L (ref 3.5–5.1)
Sodium: 136 meq/L (ref 135–145)

## 2023-05-01 LAB — HEPATIC FUNCTION PANEL
ALT: 23 U/L (ref 0–35)
AST: 17 U/L (ref 0–37)
Albumin: 4.7 g/dL (ref 3.5–5.2)
Alkaline Phosphatase: 54 U/L (ref 39–117)
Bilirubin, Direct: 0.1 mg/dL (ref 0.0–0.3)
Total Bilirubin: 0.7 mg/dL (ref 0.2–1.2)
Total Protein: 7.7 g/dL (ref 6.0–8.3)

## 2023-05-01 LAB — VITAMIN D 25 HYDROXY (VIT D DEFICIENCY, FRACTURES): VITD: 42.59 ng/mL (ref 30.00–100.00)

## 2023-05-01 LAB — LDL CHOLESTEROL, DIRECT: Direct LDL: 135 mg/dL

## 2023-05-04 ENCOUNTER — Ambulatory Visit (INDEPENDENT_AMBULATORY_CARE_PROVIDER_SITE_OTHER): Payer: PPO

## 2023-05-04 ENCOUNTER — Encounter: Payer: Self-pay | Admitting: Family Medicine

## 2023-05-04 DIAGNOSIS — G4733 Obstructive sleep apnea (adult) (pediatric): Secondary | ICD-10-CM | POA: Diagnosis not present

## 2023-05-04 DIAGNOSIS — R7309 Other abnormal glucose: Secondary | ICD-10-CM | POA: Diagnosis not present

## 2023-05-04 LAB — POCT GLYCOSYLATED HEMOGLOBIN (HGB A1C)
HbA1c POC (<> result, manual entry): 6.2 % (ref 4.0–5.6)
HbA1c, POC (controlled diabetic range): 6.2 % (ref 0.0–7.0)
HbA1c, POC (prediabetic range): 6.2 % (ref 5.7–6.4)
Hemoglobin A1C: 6.2 % — AB (ref 4.0–5.6)

## 2023-05-04 NOTE — Progress Notes (Signed)
 Pt presented for A1C. Pt was identified through two identifiers. A1c has been documented.    A1c: 6.2%   Pt was informed provider will have someone contact for further advice.

## 2023-06-01 ENCOUNTER — Ambulatory Visit
Admission: RE | Admit: 2023-06-01 | Discharge: 2023-06-01 | Disposition: A | Discharge status: Home / Self Care | Payer: PPO | Source: Ambulatory Visit | Attending: Acute Care | Admitting: Acute Care

## 2023-06-01 DIAGNOSIS — Z87891 Personal history of nicotine dependence: Secondary | ICD-10-CM | POA: Insufficient documentation

## 2023-06-01 DIAGNOSIS — Z122 Encounter for screening for malignant neoplasm of respiratory organs: Secondary | ICD-10-CM | POA: Insufficient documentation

## 2023-06-01 DIAGNOSIS — J8410 Pulmonary fibrosis, unspecified: Secondary | ICD-10-CM | POA: Diagnosis not present

## 2023-06-01 DIAGNOSIS — J432 Centrilobular emphysema: Secondary | ICD-10-CM | POA: Diagnosis not present

## 2023-06-01 DIAGNOSIS — G4733 Obstructive sleep apnea (adult) (pediatric): Secondary | ICD-10-CM | POA: Diagnosis not present

## 2023-06-01 DIAGNOSIS — R911 Solitary pulmonary nodule: Secondary | ICD-10-CM | POA: Diagnosis not present

## 2023-06-01 DIAGNOSIS — J8489 Other specified interstitial pulmonary diseases: Secondary | ICD-10-CM | POA: Diagnosis not present

## 2023-06-03 ENCOUNTER — Ambulatory Visit (INDEPENDENT_AMBULATORY_CARE_PROVIDER_SITE_OTHER): Admitting: Family Medicine

## 2023-06-03 ENCOUNTER — Other Ambulatory Visit: Payer: Self-pay | Admitting: Family Medicine

## 2023-06-03 ENCOUNTER — Encounter: Payer: Self-pay | Admitting: Family Medicine

## 2023-06-03 VITALS — BP 112/70 | HR 51 | Temp 98.4°F | Resp 20 | Ht 65.0 in | Wt 195.0 lb

## 2023-06-03 DIAGNOSIS — F39 Unspecified mood [affective] disorder: Secondary | ICD-10-CM | POA: Diagnosis not present

## 2023-06-03 DIAGNOSIS — G479 Sleep disorder, unspecified: Secondary | ICD-10-CM

## 2023-06-03 DIAGNOSIS — E538 Deficiency of other specified B group vitamins: Secondary | ICD-10-CM | POA: Diagnosis not present

## 2023-06-03 DIAGNOSIS — I10 Essential (primary) hypertension: Secondary | ICD-10-CM

## 2023-06-03 DIAGNOSIS — J432 Centrilobular emphysema: Secondary | ICD-10-CM

## 2023-06-03 DIAGNOSIS — E559 Vitamin D deficiency, unspecified: Secondary | ICD-10-CM | POA: Diagnosis not present

## 2023-06-03 DIAGNOSIS — G4733 Obstructive sleep apnea (adult) (pediatric): Secondary | ICD-10-CM

## 2023-06-03 DIAGNOSIS — K219 Gastro-esophageal reflux disease without esophagitis: Secondary | ICD-10-CM | POA: Diagnosis not present

## 2023-06-03 DIAGNOSIS — E785 Hyperlipidemia, unspecified: Secondary | ICD-10-CM

## 2023-06-03 DIAGNOSIS — R7309 Other abnormal glucose: Secondary | ICD-10-CM

## 2023-06-03 MED ORDER — BREZTRI AEROSPHERE 160-9-4.8 MCG/ACT IN AERO: 2.0000 | INHALATION_SPRAY | Freq: Two times a day (BID) | RESPIRATORY_TRACT | 11 refills | Status: DC

## 2023-06-03 MED ORDER — VALSARTAN 160 MG PO TABS: 160.0000 mg | ORAL_TABLET | Freq: Every day | ORAL | 0 refills | Status: DC

## 2023-06-03 MED ORDER — BREZTRI AEROSPHERE 160-9-4.8 MCG/ACT IN AERO: 2.0000 | INHALATION_SPRAY | Freq: Two times a day (BID) | RESPIRATORY_TRACT | Status: DC

## 2023-06-03 MED ORDER — TRAZODONE HCL 50 MG PO TABS: ORAL_TABLET | ORAL | 0 refills | Status: DC

## 2023-06-03 MED ORDER — PRAVASTATIN SODIUM 20 MG PO TABS: 20.0000 mg | ORAL_TABLET | Freq: Every day | ORAL | 0 refills | Status: DC

## 2023-06-03 MED ORDER — PANTOPRAZOLE SODIUM 40 MG PO TBEC: 40.0000 mg | DELAYED_RELEASE_TABLET | Freq: Every day | ORAL | 0 refills | Status: DC

## 2023-06-03 NOTE — Progress Notes (Signed)
 SUBJECTIVE:   Chief Complaint  Patient presents with   Cough    X 1 month   HPI Presents for acute visit  Discussed the use of AI scribe software for clinical note transcription with the patient, who gave verbal consent to proceed.  History of Present Illness Linda Henson is a 68 year old female with COPD who presents with a nagging cough.  She has been experiencing a persistent dry cough for about a month, which is particularly bothersome at night when lying down. There is no associated fever, shortness of breath, or productive cough. The cough began after moving furniture, possibly due to dust exposure, and causes discomfort in her chest. She also notes a sensation of a film in her mouth upon waking.  She has a history of COPD and has participated in annual lung scans for several years. A recent scan showed ground glass opacities, which had previously resolved but reappeared in different areas. She is not currently using any inhalers and quit smoking approximately twenty years ago.  She has recently lost about twenty pounds, started exercising, and quit drinking alcohol in January, having previously consumed about a bottle of bourbon per week.  Her current medications include Protonix 40 mg, pravastatin 40 mg (two 20 mg tablets), trazodone 50 mg, and valsartan 160 mg. She takes trazodone infrequently, about once a month. She also uses clobetasol for a skin condition managed by a dermatologist.      PERTINENT PMH / PSH: As above  OBJECTIVE:  BP 112/70   Pulse (!) 51   Temp 98.4 F (36.9 C)   Resp 20   Ht 5\' 5"  (1.651 m)   Wt 195 lb (88.5 kg)   SpO2 98%   BMI 32.45 kg/m    Physical Exam Vitals reviewed.  Constitutional:      General: She is not in acute distress.    Appearance: Normal appearance. She is normal weight. She is not ill-appearing, toxic-appearing or diaphoretic.  Eyes:     General:        Right eye: No discharge.        Left eye: No discharge.      Conjunctiva/sclera: Conjunctivae normal.  Cardiovascular:     Rate and Rhythm: Normal rate and regular rhythm.     Heart sounds: Normal heart sounds.  Pulmonary:     Effort: Pulmonary effort is normal.     Breath sounds: Wheezing present.  Abdominal:     General: Bowel sounds are normal.  Musculoskeletal:        General: Normal range of motion.  Skin:    General: Skin is warm and dry.  Neurological:     General: No focal deficit present.     Mental Status: She is alert and oriented to person, place, and time. Mental status is at baseline.  Psychiatric:        Mood and Affect: Mood normal.        Behavior: Behavior normal.        Thought Content: Thought content normal.        Judgment: Judgment normal.           06/03/2023    2:46 PM 04/08/2023    9:03 AM 04/03/2023    8:13 AM 05/26/2022   11:42 AM 02/17/2022   10:04 AM  Depression screen PHQ 2/9  Decreased Interest 0 0 0 0 1  Down, Depressed, Hopeless 0 0 0 0 0  PHQ - 2 Score 0 0  0 0 1  Altered sleeping 3 0 0 1 2  Tired, decreased energy 2 0 0 1 2  Change in appetite 0 1 1 0 2  Feeling bad or failure about yourself  0 0 0 0 0  Trouble concentrating 0 0 0 0 1  Moving slowly or fidgety/restless 0 0 0 0 0  Suicidal thoughts 0 0 0 0 0  PHQ-9 Score 5 1 1 2 8   Difficult doing work/chores Not difficult at all Not difficult at all Not difficult at all Not difficult at all Somewhat difficult      06/03/2023    2:47 PM 04/03/2023    8:13 AM 05/26/2022   11:42 AM 02/17/2022   10:05 AM  GAD 7 : Generalized Anxiety Score  Nervous, Anxious, on Edge 0 1 0 2  Control/stop worrying 0 1 0 2  Worry too much - different things 0 1 0 2  Trouble relaxing 0 1 0 2  Restless 0 0 0 2  Easily annoyed or irritable 0 1 0 2  Afraid - awful might happen 3 2 0 2  Total GAD 7 Score 3 7 0 14  Anxiety Difficulty Not difficult at all Not difficult at all Not difficult at all Somewhat difficult    ASSESSMENT/PLAN:  Centrilobular emphysema  (HCC) Assessment & Plan: Chronic cough likely due to COPD and possible post-nasal drip. Dry cough causing chest discomfort. - Provide sample of Breztri for COPD to alleviate cough. - Prescribe Flonase and Esalen nasal sprays for potential post-nasal drip. - Encourage increased fluid intake. - Recent CT chest results pending.  Follows with Pulmonology  Orders: -     Breztri Aerosphere; Inhale 2 puffs into the lungs in the morning and at bedtime. -     Breztri Aerosphere; Inhale 2 puffs into the lungs in the morning and at bedtime.  Dispense: 10.7 g; Refill: 11  Mood disorder (HCC) -     traZODone HCl; TAKE 1/2 TO 1 TABLET(25 TO 50 MG) BY MOUTH AT BEDTIME AS NEEDED FOR SLEEP  Dispense: 30 tablet; Refill: 0  Hyperlipidemia, unspecified hyperlipidemia type Assessment & Plan: Refill Pravastatin  -Check fasting lipids  Orders: -     Pravastatin Sodium; Take 1 tablet (20 mg total) by mouth daily.  Dispense: 90 tablet; Refill: 0 -     Lipid panel; Future  Primary hypertension Assessment & Plan: Well controlled -Refill Valsartan -Check Cmet  Orders: -     Valsartan; Take 1 tablet (160 mg total) by mouth daily.  Dispense: 90 tablet; Refill: 0 -     Comprehensive metabolic panel; Future  OSA on CPAP -     CBC with Differential/Platelet; Future  Abnormal glucose -     Hemoglobin A1c; Future  Vitamin D deficiency Assessment & Plan: Check Vitamin D level  Orders: -     VITAMIN D 25 Hydroxy (Vit-D Deficiency, Fractures); Future  Vitamin B 12 deficiency Assessment & Plan: Check Vitamin B 12 level  Orders: -     Vitamin B12; Future  Gastroesophageal reflux disease, unspecified whether esophagitis present Assessment & Plan: Refill Protonix  Orders: -     Pantoprazole Sodium; Take 1 tablet (40 mg total) by mouth daily.  Dispense: 90 tablet; Refill: 0  Sleeping difficulty Assessment & Plan: - Refill trazodone for sleep.      PDMP reviewed  Return if symptoms worsen  or fail to improve, for PCP.  Dana Allan, MD

## 2023-06-03 NOTE — Patient Instructions (Addendum)
 It was a pleasure meeting you today. Thank you for allowing me to take part in your health care.  Our goals for today as we discussed include:  Start Breztri 2 puffs two times a day for cough.  Sample given Follow up with Pulmonology  Schedule fasting lab appointment.  Fast for 10 hours  Refills sent for requested medication    This is a list of the screening recommended for you and due dates:  Health Maintenance  Topic Date Due   COVID-19 Vaccine (7 - 2024-25 season) 11/16/2022   Medicare Annual Wellness Visit  04/07/2024   Mammogram  04/14/2025   Colon Cancer Screening  08/20/2026   DTaP/Tdap/Td vaccine (2 - Td or Tdap) 01/30/2028   Pneumonia Vaccine  Completed   Flu Shot  Completed   DEXA scan (bone density measurement)  Completed   Hepatitis C Screening  Completed   Zoster (Shingles) Vaccine  Completed   HPV Vaccine  Aged Out      If you have any questions or concerns, please do not hesitate to call the office at [phone].  I look forward to our next visit and until then take care and stay safe.  Regards,   Dana Allan, MD   [facility]

## 2023-06-03 NOTE — Assessment & Plan Note (Addendum)
 Chronic cough likely due to COPD and possible post-nasal drip. Dry cough causing chest discomfort. - Provide sample of Breztri for COPD to alleviate cough. - Prescribe Flonase and Esalen nasal sprays for potential post-nasal drip. - Encourage increased fluid intake. - Recent CT chest results pending.  Follows with Pulmonology

## 2023-06-04 ENCOUNTER — Other Ambulatory Visit (HOSPITAL_COMMUNITY): Payer: Self-pay

## 2023-06-04 NOTE — Telephone Encounter (Signed)
 Good afternoon, I did a test bill for [facility] and it went through with no problem her copay came back $47.00

## 2023-06-10 ENCOUNTER — Encounter: Payer: Self-pay | Admitting: Family Medicine

## 2023-06-10 DIAGNOSIS — E559 Vitamin D deficiency, unspecified: Secondary | ICD-10-CM | POA: Insufficient documentation

## 2023-06-10 DIAGNOSIS — R7309 Other abnormal glucose: Secondary | ICD-10-CM | POA: Insufficient documentation

## 2023-06-10 DIAGNOSIS — E538 Deficiency of other specified B group vitamins: Secondary | ICD-10-CM | POA: Insufficient documentation

## 2023-06-10 NOTE — Assessment & Plan Note (Addendum)
 Refill Pravastatin  -Check fasting lipids

## 2023-06-10 NOTE — Assessment & Plan Note (Signed)
 Check Vitamin B 12 level

## 2023-06-10 NOTE — Assessment & Plan Note (Signed)
 Well controlled -Refill Valsartan -Check Cmet

## 2023-06-10 NOTE — Assessment & Plan Note (Signed)
 Refill trazodone for sleep.

## 2023-06-10 NOTE — Assessment & Plan Note (Signed)
 Refill Protonix

## 2023-06-10 NOTE — Assessment & Plan Note (Signed)
 Check Vitamin D level

## 2023-06-15 ENCOUNTER — Other Ambulatory Visit (INDEPENDENT_AMBULATORY_CARE_PROVIDER_SITE_OTHER): Payer: PPO

## 2023-06-15 DIAGNOSIS — G4733 Obstructive sleep apnea (adult) (pediatric): Secondary | ICD-10-CM

## 2023-06-15 DIAGNOSIS — E559 Vitamin D deficiency, unspecified: Secondary | ICD-10-CM

## 2023-06-15 DIAGNOSIS — I10 Essential (primary) hypertension: Secondary | ICD-10-CM

## 2023-06-15 DIAGNOSIS — E538 Deficiency of other specified B group vitamins: Secondary | ICD-10-CM | POA: Diagnosis not present

## 2023-06-15 DIAGNOSIS — E785 Hyperlipidemia, unspecified: Secondary | ICD-10-CM

## 2023-06-15 DIAGNOSIS — R7309 Other abnormal glucose: Secondary | ICD-10-CM | POA: Diagnosis not present

## 2023-06-15 LAB — COMPREHENSIVE METABOLIC PANEL WITH GFR
ALT: 19 U/L (ref 0–35)
AST: 14 U/L (ref 0–37)
Albumin: 4.2 g/dL (ref 3.5–5.2)
Alkaline Phosphatase: 54 U/L (ref 39–117)
BUN: 16 mg/dL (ref 6–23)
CO2: 24 meq/L (ref 19–32)
Calcium: 8.9 mg/dL (ref 8.4–10.5)
Chloride: 107 meq/L (ref 96–112)
Creatinine, Ser: 0.84 mg/dL (ref 0.40–1.20)
GFR: 71.52 mL/min (ref >60.00)
Glucose, Bld: 123 mg/dL — ABNORMAL HIGH (ref 70–99)
Potassium: 4.2 meq/L (ref 3.5–5.1)
Sodium: 140 meq/L (ref 135–145)
Total Bilirubin: 0.4 mg/dL (ref 0.2–1.2)
Total Protein: 6.9 g/dL (ref 6.0–8.3)

## 2023-06-15 LAB — CBC WITH DIFFERENTIAL/PLATELET
Basophils Absolute: 0 10*3/uL (ref 0.0–0.1)
Basophils Relative: 0.4 % (ref 0.0–3.0)
Eosinophils Absolute: 0.6 10*3/uL (ref 0.0–0.7)
Eosinophils Relative: 6.1 % — ABNORMAL HIGH (ref 0.0–5.0)
HCT: 38.2 % (ref 36.0–46.0)
Hemoglobin: 13.3 g/dL (ref 12.0–15.0)
Lymphocytes Relative: 25 % (ref 12.0–46.0)
Lymphs Abs: 2.4 10*3/uL (ref 0.7–4.0)
MCHC: 34.9 g/dL (ref 30.0–36.0)
MCV: 97.7 fl (ref 78.0–100.0)
Monocytes Absolute: 0.5 10*3/uL (ref 0.1–1.0)
Monocytes Relative: 5.6 % (ref 3.0–12.0)
Neutro Abs: 6 10*3/uL (ref 1.4–7.7)
Neutrophils Relative %: 62.9 % (ref 43.0–77.0)
Platelets: 182 10*3/uL (ref 150.0–400.0)
RBC: 3.91 Mil/uL (ref 3.87–5.11)
RDW: 13.2 % (ref 11.5–15.5)
WBC: 9.6 10*3/uL (ref 4.0–10.5)

## 2023-06-15 LAB — VITAMIN D 25 HYDROXY (VIT D DEFICIENCY, FRACTURES): VITD: 31.66 ng/mL (ref 30.00–100.00)

## 2023-06-15 LAB — LIPID PANEL
Cholesterol: 157 mg/dL (ref 0–200)
HDL: 44 mg/dL (ref >39.00)
LDL Cholesterol: 78 mg/dL (ref 0–99)
NonHDL: 113.36
Total CHOL/HDL Ratio: 4
Triglycerides: 176 mg/dL — ABNORMAL HIGH (ref 0.0–149.0)
VLDL: 35.2 mg/dL (ref 0.0–40.0)

## 2023-06-15 LAB — HEMOGLOBIN A1C: Hgb A1c MFr Bld: 5.9 % (ref 4.6–6.5)

## 2023-06-15 LAB — VITAMIN B12: Vitamin B-12: 334 pg/mL (ref 211–911)

## 2023-06-25 ENCOUNTER — Telehealth: Payer: Self-pay | Admitting: *Deleted

## 2023-06-25 DIAGNOSIS — F17210 Nicotine dependence, cigarettes, uncomplicated: Secondary | ICD-10-CM

## 2023-06-25 DIAGNOSIS — Z122 Encounter for screening for malignant neoplasm of respiratory organs: Secondary | ICD-10-CM

## 2023-06-25 DIAGNOSIS — Z87891 Personal history of nicotine dependence: Secondary | ICD-10-CM

## 2023-06-25 NOTE — Telephone Encounter (Signed)
 Patient called in requesting results of lung screening CT done 06/01/23. I called [facility] radiology and requested the reading of this scan. Kandice Robinsons, NP also called and requested the reading of the scan.  I called and spoke with patient and advised that 2 calls have been made to get her scan read. I advised patient that as soon as we have the results we will let her know. Pt verbalized understanding.

## 2023-06-26 NOTE — Telephone Encounter (Signed)
 Called and spoke to patient regarding LDCT. Informed her of the LR2 results with the recommendations to have a 12 month follow up scan, order placed. Also informed her of the ILD and PAH noted. Discussed this with patient in detail. Patient sees Dr. Belia Heman at [facility] in [place] for COPD and OSA. Patient was seen in 10/2022 and advised to follow back up in 10/2023. Will send results to Dr. Belia Heman. Patient aware Dr. Clovis Fredrickson office will follow up with patient if he wants her seen any sooner than 10/2023. Will also send to PCP as FYI.    IMPRESSION: 1. Lung-RADS 2, benign appearance or behavior. Continue annual screening with low-dose chest CT without contrast in 12 months. 2. Pulmonary artery enlargement suggests pulmonary arterial hypertension. 3. Interstitial lung disease, favoring nonspecific interstitial pneumonitis. 4. Aortic atherosclerosis (ICD10-I70.0) and emphysema (ICD10-J43.9).     Electronically Signed   By: Jeronimo Greaves M.D.   On: 06/25/2023 13:52

## 2023-07-01 ENCOUNTER — Other Ambulatory Visit: Payer: Self-pay

## 2023-07-01 ENCOUNTER — Encounter: Payer: Self-pay | Admitting: Emergency Medicine

## 2023-07-01 ENCOUNTER — Ambulatory Visit: Admission: EM | Admit: 2023-07-01 | Discharge: 2023-07-01 | Disposition: A | Discharge status: Home / Self Care

## 2023-07-01 DIAGNOSIS — N3001 Acute cystitis with hematuria: Secondary | ICD-10-CM | POA: Insufficient documentation

## 2023-07-01 LAB — POCT URINALYSIS DIP (MANUAL ENTRY)
Bilirubin, UA: NEGATIVE
Glucose, UA: NEGATIVE mg/dL
Ketones, POC UA: NEGATIVE mg/dL
Nitrite, UA: NEGATIVE
Protein Ur, POC: NEGATIVE mg/dL
Spec Grav, UA: 1.02
Urobilinogen, UA: 1 U/dL
pH, UA: 6.5

## 2023-07-01 MED ORDER — NITROFURANTOIN MONOHYD MACRO 100 MG PO CAPS: 100.0000 mg | ORAL_CAPSULE | Freq: Two times a day (BID) | ORAL | 0 refills | Status: DC

## 2023-07-01 NOTE — ED Provider Notes (Signed)
 [facility]  Note:  This document was prepared using Conservation officer, historic buildings and may include unintentional dictation errors.  MRN: [id number] DOB: [birth year]  Subjective:   Linda Henson is a 68 y.o. female presenting for dysuria, lower abdominal pressure x 2 to 3 days with 2 weeks of increased urinary output.  Patient concern for UTI.  Denies any fever, body aches, fatigue, weakness, shortness of breath, chest pain.  No diarrhea, vomiting.  Past history of UTIs most recent urinary tract infection was 3 years ago.  No current facility-administered medications for this encounter.  Current Outpatient Medications:    nitrofurantoin, macrocrystal-monohydrate, (MACROBID) 100 MG capsule, Take 1 capsule (100 mg total) by mouth 2 (two) times daily., Disp: 10 capsule, Rfl: 0   budeson-glycopyrrolate-formoterol (BREZTRI AEROSPHERE) 160-9-4.8 MCG/ACT AERO, Inhale 2 puffs into the lungs in the morning and at bedtime., Disp: , Rfl:    budeson-glycopyrrolate-formoterol (BREZTRI AEROSPHERE) 160-9-4.8 MCG/ACT AERO, Inhale 2 puffs into the lungs in the morning and at bedtime., Disp: 10.7 g, Rfl: 11   Calcium Carb-Cholecalciferol (CALCIUM 500 + D PO), Take 1 tablet by mouth daily., Disp: , Rfl:    Cholecalciferol (VITAMIN D3) 50 MCG (2000 UT) CAPS, Take 1 capsule by mouth daily., Disp: , Rfl:    clobetasol ointment (TEMOVATE) 0.05 %, Apply 1 Application topically 2 (two) times daily. Apply to aa's BID PRN flares., Disp: 60 g, Rfl: 1   pantoprazole (PROTONIX) 40 MG tablet, Take 1 tablet (40 mg total) by mouth daily., Disp: 90 tablet, Rfl: 0   pravastatin (PRAVACHOL) 20 MG tablet, Take 1 tablet (20 mg total) by mouth daily., Disp: 90 tablet, Rfl: 0   traZODone (DESYREL) 50 MG tablet, TAKE 1/2 TO 1 TABLET(25 TO 50 MG) BY MOUTH AT BEDTIME AS NEEDED FOR SLEEP, Disp: 30 tablet, Rfl: 0   valsartan (DIOVAN) 160 MG tablet, Take 1 tablet (160 mg total) by mouth daily., Disp: 90 tablet, Rfl: 0    No Known Allergies  Past Medical History:  Diagnosis Date   Arthritis    spine - mild   Colon polyp    Repeat colonoscopy 2018   COPD (chronic obstructive pulmonary disease) (HCC)    Diverticulitis    GERD (gastroesophageal reflux disease)    Resolved with wt loss   Hepatitis C 2018   treated with Harvoni   History of gastric ulcer    Hyperlipidemia    Borderline    Hypertension    Iron deficiency anemia 2014   Sleep apnea    CPAP     Past Surgical History:  Procedure Laterality Date   ABDOMINAL HYSTERECTOMY  1994   CATARACT EXTRACTION W/PHACO Right 08/30/2019   Procedure: CATARACT EXTRACTION PHACO AND INTRAOCULAR LENS PLACEMENT (IOC) RIGHT;  Surgeon: Galen Manila, MD;  Location: [facility];  Service: Ophthalmology;  Laterality: Right;  2.16 0:20.2   COLONOSCOPY N/A 08/19/2021   Procedure: COLONOSCOPY;  Surgeon: Jaynie Collins, DO;  Location: [facility];  Service: Gastroenterology;  Laterality: N/A;   COLONOSCOPY WITH PROPOFOL N/A 02/05/2015   Procedure: COLONOSCOPY WITH PROPOFOL;  Surgeon: Elnita Maxwell, MD;  Location: [facility] END[facility];  Service: Endoscopy;  Laterality: N/A;   EYE SURGERY     SALPINGOOPHORECTOMY Right 1994    Family History  Problem Relation Age of Onset   Heart disease Father 1       CAD - died of MI   Hyperlipidemia Father    Cancer Brother 31  stomach and esophageal cancer   Hypothyroidism Sister    Diabetes Paternal Grandmother    Breast cancer Neg Hx     Social History   Tobacco Use   Smoking status: Former    Current packs/day: 0.00    Types: Cigarettes    Start date: 12/15/1988    Quit date: 12/15/1992    Years since quitting: 30.5   Smokeless tobacco: Never   Tobacco comments:    Smoked a pack per week.  Vaping Use   Vaping status: Never Used  Substance Use Topics   Alcohol use: Yes    Alcohol/week: 14.0 standard drinks of alcohol    Types: 14 Standard drinks or equivalent per week   Drug  use: No    ROS Refer to HPI for ROS details.  Objective:   Vitals: BP (!) 178/98 (BP Location: Left Arm)   Pulse (!) 48   Temp 97.8 F (36.6 C) (Temporal)   Resp 18   SpO2 96%   Physical Exam Vitals and nursing note reviewed.  Constitutional:      General: She is not in acute distress.    Appearance: She is well-developed. She is not ill-appearing or toxic-appearing.  HENT:     Head: Normocephalic and atraumatic.  Cardiovascular:     Rate and Rhythm: Normal rate.  Pulmonary:     Effort: Pulmonary effort is normal. No respiratory distress.  Abdominal:     General: There is no distension.     Tenderness: There is no abdominal tenderness. There is no right CVA tenderness, left CVA tenderness, guarding or rebound.  Musculoskeletal:        General: Normal range of motion.  Skin:    General: Skin is warm and dry.  Neurological:     General: No focal deficit present.     Mental Status: She is alert and oriented to person, place, and time.  Psychiatric:        Mood and Affect: Mood normal.        Behavior: Behavior normal.     Procedures  Results for orders placed or performed during the hospital encounter of 07/01/23 (from the past 24 hours)  POCT urinalysis dipstick     Status: Abnormal   Collection Time: 07/01/23 12:43 PM  Result Value Ref Range   Color, UA yellow    Clarity, UA clear    Glucose, UA negative mg/dL   Bilirubin, UA negative    Ketones, POC UA negative mg/dL   Spec Grav, UA 8.119    Blood, UA trace-intact (A)    pH, UA 6.5    Protein Ur, POC negative mg/dL   Urobilinogen, UA 1.0 E.U./dL   Nitrite, UA Negative    Leukocytes, UA Trace (A)     No results found.   Assessment and Plan :     Discharge Instructions      1. Acute cystitis with hematuria (Primary) - POCT urinalysis dipstick performed in UC shows trace leukocytes, trace blood, no nitrite, these findings are possibly indicative of urinary tract infection, will treat empirically  until culture results return. - Urine Culture collected and sent to lab results should be available in 2 to 3 days. - nitrofurantoin, macrocrystal-monohydrate, (MACROBID) 100 MG capsule; Take 1 capsule (100 mg total) by mouth 2 (two) times daily.  Dispense: 10 capsule; Refill: 0        Tonny Bollman, [place] B, NP 07/01/23 1302

## 2023-07-01 NOTE — ED Triage Notes (Signed)
 Patient presents to [facility] for evaluation of URI x one week or more, which has caused some increase in urination, which happens when she coughs a lot.  However, her other symptoms are now resolving, but she has noticed the increased urination has remained, she has had some left back pain, and starting yesterday she began to have lower abdominal pain and increased frequency.  Positive home test for UTI.

## 2023-07-01 NOTE — Discharge Instructions (Addendum)
 1. Acute cystitis with hematuria (Primary) - POCT urinalysis dipstick performed in UC shows trace leukocytes, trace blood, no nitrite, these findings are possibly indicative of urinary tract infection, will treat empirically until culture results return. - Urine Culture collected and sent to lab results should be available in 2 to 3 days. - nitrofurantoin, macrocrystal-monohydrate, (MACROBID) 100 MG capsule; Take 1 capsule (100 mg total) by mouth 2 (two) times daily.  Dispense: 10 capsule; Refill: 0

## 2023-07-02 ENCOUNTER — Ambulatory Visit: Admitting: Nurse Practitioner

## 2023-07-02 DIAGNOSIS — G4733 Obstructive sleep apnea (adult) (pediatric): Secondary | ICD-10-CM | POA: Diagnosis not present

## 2023-07-02 LAB — URINE CULTURE: Special Requests: NORMAL

## 2023-07-06 NOTE — Telephone Encounter (Signed)
 I spoke with the patient and I have scheduled her an appt for 5/24 at 9:45.  Nothing further needed.

## 2023-07-16 DIAGNOSIS — G4733 Obstructive sleep apnea (adult) (pediatric): Secondary | ICD-10-CM | POA: Diagnosis not present

## 2023-07-17 ENCOUNTER — Telehealth: Payer: Self-pay

## 2023-07-17 NOTE — Telephone Encounter (Signed)
 Spoke to pt

## 2023-07-17 NOTE — Telephone Encounter (Signed)
 Copied from CRM [id number]. Topic: General - Call Back - No Documentation >> Jul 17, 2023 12:20 PM [employer] wrote: Reason for CRM: Patient returning call she received today 07/17/23 from [place] at 12 pm. In the voicemail it stated to call back but no further information.    Updated called and spoke to pt and she stated that she is using [facility] and disregard the [facility] request.

## 2023-07-17 NOTE — Telephone Encounter (Signed)
 Called pt to verify which pharmacy she uses to get her Pravastatin  filled.

## 2023-07-21 ENCOUNTER — Ambulatory Visit: Payer: PPO | Admitting: Dermatology

## 2023-08-01 DIAGNOSIS — G4733 Obstructive sleep apnea (adult) (pediatric): Secondary | ICD-10-CM | POA: Diagnosis not present

## 2023-08-05 ENCOUNTER — Ambulatory Visit: Admitting: Dermatology

## 2023-08-05 DIAGNOSIS — L900 Lichen sclerosus et atrophicus: Secondary | ICD-10-CM

## 2023-08-05 DIAGNOSIS — Z79899 Other long term (current) drug therapy: Secondary | ICD-10-CM

## 2023-08-05 NOTE — Progress Notes (Signed)
   Follow-Up Visit   Subjective  Linda Henson is a 68 y.o. female who presents for the following: Lichen Sclerosus et Atrophicus f/u, patient started clobetasol  ointment, last used about 2 weeks ago and has been only time she has flared in 6 months.    The following portions of the chart were reviewed this encounter and updated as appropriate: medications, allergies, medical history  Review of Systems:  No other skin or systemic complaints except as noted in HPI or Assessment and Plan.  Objective  Well appearing patient in no apparent distress; mood and affect are within normal limits.  A focused examination was performed of the following areas: Face, groin  Relevant exam findings are noted in the Assessment and Plan.    Assessment & Plan   LICHEN SCLEROSUS ET ATROPHICUS Exam: erythema with hypo/hyperpigmentation with complete agglutination .at labia minora  Chronic and persistent condition with duration or expected duration over one year. Condition is symptomatic/ bothersome to patient. Not currently at goal.   Lichen sclerosus is a chronic inflammatory condition of unknown cause that frequently involves the vaginal area and less commonly extragenital skin, and is NOT sexually transmitted. It frequently causes symptoms of pain and burning.  It requires regular monitoring and treatment with topical steroids to minimize inflammation and to reduce risk of scarring. There is also a risk of cancer in the vaginal area which is very low if inflammation is well controlled. Regular checks of the area are recommended. Please call if you notice any new or changing spots within this area.  Treatment Plan: Continue clobetasol  ointment once daily for another two weeks, if still symptomatic can do twice a day, once symptoms all resolved, then gradually decrease to three nights a week for a couple of months. And then continue twice weekly for maintenance.  Topical steroids (such as  triamcinolone , fluocinolone, fluocinonide, mometasone, clobetasol , halobetasol, betamethasone , hydrocortisone) can cause thinning and lightening of the skin if they are used for too long in the same area. Your physician has selected the right strength medicine for your problem and area affected on the body. Please use your medication only as directed by your physician to prevent side effects.   Long term medication management.  Patient is using long term (months to years) prescription medication  to control their dermatologic condition.  These medications require periodic monitoring to evaluate for efficacy and side effects and may require periodic laboratory monitoring.    Return in about 6 months (around 02/05/2024) for TBSE, f/u.  I, Jacquelynn V. Grier Leber, CMA, am acting as scribe for Artemio Larry, MD .   Documentation: I have reviewed the above documentation for accuracy and completeness, and I agree with the above.  Artemio Larry, MD

## 2023-08-05 NOTE — Patient Instructions (Addendum)
 Continue clobetasol  ointment once daily for another two weeks, if really flared can do twice a day, then gradually three nights a week for a couple of months. Avoid applying to face, groin, and axilla. Use as directed. Long-term use can cause thinning of the skin.  Topical steroids (such as triamcinolone , fluocinolone, fluocinonide, mometasone, clobetasol , halobetasol, betamethasone , hydrocortisone) can cause thinning and lightening of the skin if they are used for too long in the same area. Your physician has selected the right strength medicine for your problem and area affected on the body. Please use your medication only as directed by your physician to prevent side effects.     Due to recent changes in healthcare laws, you may see results of your pathology and/or laboratory studies on MyChart before the doctors have had a chance to review them. We understand that in some cases there may be results that are confusing or concerning to you. Please understand that not all results are received at the same time and often the doctors may need to interpret multiple results in order to provide you with the best plan of care or course of treatment. Therefore, we ask that you please give us  2 business days to thoroughly review all your results before contacting the office for clarification. Should we see a critical lab result, you will be contacted sooner.   If You Need Anything After Your Visit  If you have any questions or concerns for your doctor, please call our main line at [phone] and press option 4 to reach your doctor's medical assistant. If no one answers, please leave a voicemail as directed and we will return your call as soon as possible. Messages left after 4 pm will be answered the following business day.   You may also send us  a message via MyChart. We typically respond to MyChart messages within 1-2 business days.  For prescription refills, please ask your pharmacy to contact our  office. Our fax number is [phone].  If you have an urgent issue when the clinic is closed that cannot wait until the next business day, you can page your doctor at the number below.    Please note that while we do our best to be available for urgent issues outside of office hours, we are not available 24/7.   If you have an urgent issue and are unable to reach us , you may choose to seek medical care at your doctor's office, retail clinic, urgent care center, or emergency room.  If you have a medical emergency, please immediately call 911 or go to the emergency department.  Pager Numbers  - Dr. Bary Likes: [phone]  - Dr. Annette Barters: [phone]  - Dr. Felipe Horton: [phone]   In the event of inclement weather, please call our main line at [phone] for an update on the status of any delays or closures.  Dermatology Medication Tips: Please keep the boxes that topical medications come in in order to help keep track of the instructions about where and how to use these. Pharmacies typically print the medication instructions only on the boxes and not directly on the medication tubes.   If your medication is too expensive, please contact our office at [phone] option 4 or send us  a message through MyChart.   We are unable to tell what your co-pay for medications will be in advance as this is different depending on your insurance coverage. However, we may be able to find a substitute medication at lower cost or fill out paperwork to  get insurance to cover a needed medication.   If a prior authorization is required to get your medication covered by your insurance company, please allow us  1-2 business days to complete this process.  Drug prices often vary depending on where the prescription is filled and some pharmacies may offer cheaper prices.  The website www.goodrx.com contains coupons for medications through different pharmacies. The prices here do not account for what the cost  may be with help from insurance (it may be cheaper with your insurance), but the website can give you the price if you did not use any insurance.  - You can print the associated coupon and take it with your prescription to the pharmacy.  - You may also stop by our office during regular business hours and pick up a GoodRx coupon card.  - If you need your prescription sent electronically to a different pharmacy, notify our office through [facility] or by phone at [phone] option 4.     Si Usted Necesita Algo Despus de Su Visita  Tambin puede enviarnos un mensaje a travs de Clinical cytogeneticist. Por lo general respondemos a los mensajes de MyChart en el transcurso de 1 a 2 das hbiles.  Para renovar recetas, por favor pida a su farmacia que se ponga en contacto con nuestra oficina. Franz Jacks de fax es [place] [phone].  Si tiene un asunto urgente cuando la clnica est cerrada y que no puede esperar hasta el siguiente da hbil, puede llamar/localizar a su doctor(a) al nmero que aparece a continuacin.   Por favor, tenga en cuenta que aunque hacemos todo lo posible para estar disponibles para asuntos urgentes fuera del horario de [place], no estamos disponibles las 24 horas del da, los 7 [address]  [address] de la [place].   Si tiene un problema urgente y no puede comunicarse con nosotros, puede optar por buscar atencin mdica  en el consultorio de su doctor(a), en una clnica privada, en un centro de atencin urgente o en una sala de emergencias.  Si tiene Engineer, drilling, por favor llame inmediatamente al 911 o vaya a la sala de emergencias.  Nmeros de bper  - Dr. Bary Likes: [phone]  - Dra. Annette Barters: [phone]  - Dr. Felipe Horton: [phone]   En caso de inclemencias del tiempo, por favor llame a Lajuan Pila principal al [phone] para una actualizacin sobre el [place] de cualquier retraso o cierre.  Consejos para la medicacin en dermatologa: Por favor, guarde las cajas en  las que vienen los medicamentos de uso tpico para ayudarle a seguir las instrucciones sobre dnde y cmo usarlos. Las farmacias generalmente imprimen las instrucciones del medicamento slo en las cajas y no directamente en los tubos del [place].   Si su medicamento es muy caro, por favor, pngase en contacto con Bettyjane Brunet llamando al [phone] y presione la opcin 4 o envenos un mensaje a travs de Clinical cytogeneticist.   No podemos decirle cul ser su copago por los medicamentos por adelantado ya que esto es diferente dependiendo de la cobertura de su seguro. Sin embargo, es posible que podamos encontrar un medicamento sustituto a Audiological scientist un formulario para que el seguro cubra el medicamento que se considera necesario.   Si se requiere una autorizacin previa para que su compaa de seguros [place] su medicamento, por favor permtanos de 1 a 2 das hbiles para completar este proceso.  Los precios de los medicamentos varan con frecuencia dependiendo del Environmental consultant de dnde se surte la receta y [place]  pueden ofrecer precios ms baratos.  El sitio web www.goodrx.com tiene cupones para medicamentos de Health and safety inspector. Los precios aqu no tienen en cuenta lo que podra costar con la ayuda del seguro (puede ser ms barato con su seguro), pero el sitio web puede darle el precio si no utiliz Tourist information centre manager.  - Puede imprimir el cupn correspondiente y llevarlo con su receta a la farmacia.  - Tambin puede pasar por nuestra oficina durante el horario de atencin regular y Education officer, museum una tarjeta de cupones de GoodRx.  - Si necesita que su receta se enve electrnicamente a una farmacia diferente, informe a nuestra oficina a travs de MyChart de [place] o por telfono llamando al [phone] y presione la opcin 4.

## 2023-08-11 ENCOUNTER — Ambulatory Visit: Admitting: Internal Medicine

## 2023-08-11 ENCOUNTER — Encounter: Payer: Self-pay | Admitting: Internal Medicine

## 2023-08-11 VITALS — BP 110/60 | HR 51 | Temp 98.5°F | Ht 65.0 in | Wt 198.4 lb

## 2023-08-11 DIAGNOSIS — J439 Emphysema, unspecified: Secondary | ICD-10-CM

## 2023-08-11 DIAGNOSIS — Z87891 Personal history of nicotine dependence: Secondary | ICD-10-CM | POA: Diagnosis not present

## 2023-08-11 DIAGNOSIS — G4733 Obstructive sleep apnea (adult) (pediatric): Secondary | ICD-10-CM | POA: Diagnosis not present

## 2023-08-11 DIAGNOSIS — E669 Obesity, unspecified: Secondary | ICD-10-CM | POA: Diagnosis not present

## 2023-08-11 DIAGNOSIS — Z6833 Body mass index (BMI) 33.0-33.9, adult: Secondary | ICD-10-CM

## 2023-08-11 DIAGNOSIS — J449 Chronic obstructive pulmonary disease, unspecified: Secondary | ICD-10-CM

## 2023-08-11 DIAGNOSIS — R5381 Other malaise: Secondary | ICD-10-CM | POA: Diagnosis not present

## 2023-08-11 NOTE — Progress Notes (Signed)
 Name: Linda Henson MRN: [id number] DOB: 08-23-1955     Pulmonary tests:  PFT 03/26/17 >> FEV1 2.58 (103%), FEV1% 80, TLC 4.21 (83%), DLCO 59%  Chest imaging:  LDCT chest 09/29/18 >> mild subpleural reticulation/ paraseptal emphysema in lung apices, mild subpleural reticulation/dependent ATX RLL, 1.9 mm LUL, 1.6 mm calcified granuloma LLL, mod HH, fatty liver  LDCT 09/2021 Centrilobular and paraseptal emphysema. Smoking related respiratory bronchiolitis. Calcified granuloma. 2.1 mm left upper lobe nodule. No suspicious pulmonary nodules. No pleural fluid. Airway is unremarkable  LDCT May 2025 INDependently reviewed by me today Bilateral interstitial peripheral disease likely related to smoking Some evidence of emphysema Dilated pulmonary artery however no significant symptoms Serology:  Labs 04/12/19 >> RA 11.3, CCP 7, CRP 3  Sleep:  PSG 10/01/14 >> AHI 62, SpO2 low 65.2% Auto CPAP 07/05/19 to 08/03/19 >> used on 30 of 30 nights with average 7 hrs 52 min.  Average AHI 1.3 with median CPAP 11 and 95 th percentile CPAP 15 cm H2O.   CHIEF COMPLAINT:  Assessment of OSA Assessment of tobacco abuse Follow-up lung cancer screening program  HISTORY OF PRESENT ILLNESS: Assessment of OSA Patient uses and benefits from therapy Using CPAP nightly and with naps Pressure setting is comfortable and is sleeping well.  Patient has 100% compliance for days and greater than 4 hours AHI is significantly reduced to 1.6 Auto CPAP 4-20 Uses full facemask Patient uses and benefits from therapy   Former smoker Diagnosis of emphysema No maintenance therapy at this time Albuterol  as needed No exacerbation at this time No evidence of heart failure at this time No evidence or signs of infection at this time No respiratory distress No fevers, chills, nausea, vomiting, diarrhea No evidence of lower extremity edema No evidence hemoptysis  Follow-up lung cancer screening program CT chest  reviewed in detail with patient  PAST MEDICAL HISTORY :   has a past medical history of Arthritis, Colon polyp, COPD (chronic obstructive pulmonary disease) (HCC), Diverticulitis, GERD (gastroesophageal reflux disease), Hepatitis C (2018), History of gastric ulcer, Hyperlipidemia, Hypertension, Iron deficiency anemia (2014), and Sleep apnea.  has a past surgical history that includes Abdominal hysterectomy (1994); Salpingoophorectomy (Right, 1994); Eye surgery; Colonoscopy with propofol  (N/A, 02/05/2015); Cataract extraction w/PHACO (Right, 08/30/2019); and Colonoscopy (N/A, 08/19/2021). Prior to Admission medications   Medication Sig Start Date End Date Taking? Authorizing Provider  albuterol  (VENTOLIN  HFA) 108 (90 Base) MCG/ACT inhaler Inhale 2 puffs into the lungs every 4 (four) hours as needed for wheezing or shortness of breath. 04/29/21  Yes Rodriguez-Southworth, Lamond Pilot, PA-C  clobetasol  ointment (TEMOVATE ) 0.05 % Apply 1 Application topically 2 (two) times daily. Apply to aa's BID PRN flares. 11/26/21  Yes Artemio Larry, MD  pantoprazole  (PROTONIX ) 40 MG tablet Take 1 tablet (40 mg total) by mouth daily. 04/09/22  Yes Kent Pear, MD  traZODone  (DESYREL ) 50 MG tablet TAKE 1/2 TO 1 TABLET(25 TO 50 MG) BY MOUTH AT BEDTIME AS NEEDED FOR SLEEP 06/23/22  Yes Kent Pear, MD  triamcinolone  ointment (KENALOG ) 0.1 % Apply to affected areas twice a week 03/25/22  Yes Moye, [place] , MD  valsartan  (DIOVAN ) 160 MG tablet TAKE 1 TABLET(160 MG) BY MOUTH DAILY 12/04/21  Yes Kent Pear, MD   No Known Allergies  FAMILY HISTORY:  family history includes Cancer (age of onset: 80) in her brother; Diabetes in her paternal grandmother; Heart disease (age of onset: 107) in her father; Hyperlipidemia in her father; Hypothyroidism in her sister. SOCIAL  HISTORY:  reports that she quit smoking about 30 years ago. Her smoking use included cigarettes. She started smoking about 34 years ago. She has never  used smokeless tobacco. She reports current alcohol use of about 14.0 standard drinks of alcohol per week. She reports that she does not use drugs.  BP 110/60 (BP Location: Right Arm, Patient Position: Sitting, Cuff Size: Large)   Pulse (!) 51   Temp 98.5 F (36.9 C) (Oral)   Ht 5\' 5"  (1.651 m)   Wt 198 lb 6.4 oz (90 kg)   SpO2 96%   BMI 33.02 kg/m     Review of Systems: Gen:  Denies  fever, sweats, chills weight loss  HEENT: Denies blurred vision, double vision, ear pain, eye pain, hearing loss, nose bleeds, sore throat Cardiac:  No dizziness, chest pain or heaviness, chest tightness,edema, No JVD Resp:   No cough, -sputum production, -shortness of breath,-wheezing, -hemoptysis,  Other:  All other systems negative   Physical Examination:   General Appearance: No distress  EYES PERRLA, EOM intact.   NECK Supple, No JVD Pulmonary: normal breath sounds, No wheezing.  CardiovascularNormal S1,S2.  No m/r/g.   Abdomen: Benign, Soft, non-tender. Neurology UE/LE 5/5 strength, no focal deficits Ext pulses intact, cap refill intact ALL OTHER ROS ARE NEGATIVE       ASSESSMENT AND PLAN SYNOPSIS  68 year old pleasant white female seen today for assessment of OSA along with emphysema and former smoker, follow-up lung cancer screening program   Assessment of OSA Previous AHI 62 Continue CPAP as prescribed  Excellent compliance report Reviewed compliance report in detail with patient Patient definitely benefits the use of CPAP therapy as prescribed Using CPAP nightly and with naps Pressure setting is comfortable and is sleeping well. CPAP prescription 4-20 AHI reduced to 1.2  No evidence of acute heart failure at this time No respiratory distress No fevers, chills, nausea, vomiting, diarrhea No evidence hemoptysis  Patient Instructions Continue to use CPAP every night, minimum of 4-6 hours a night.  Change equipment every 30 days or as directed by DME.  Wash your  tubing with warm soap and water daily, hang to dry. Wash humidifier portion weekly. Use bottled, distilled water and change daily   Be aware of reduced alertness and do not drive or operate heavy machinery if experiencing this or drowsiness.  Exercise encouraged, as tolerated. Encouraged proper weight management.  Important to get eight or more hours of sleep  Limiting the use of the computer and television before bedtime.  Decrease naps during the day, so night time sleep will become enhanced.  Limit caffeine, and sleep deprivation.  HTN, stroke, uncontrolled diabetes and heart failure are potential risk factors.  Risk of untreated sleep apnea including cardiac arrhthymias, stroke, DM, pulm HTN.    Assessment of COPD No indication for antibiotics or prednisone  at this time No maintenance therapy at this time Continue albuterol  as needed Avoid Allergens and Irritants Avoid secondhand smoke Avoid SICK contacts Recommend  Masking  when appropriate Recommend Keep up-to-date with vaccinations Continue to use Breztri  as needed Patient had viral bronchitis and chest congestion 2 months ago now resolved   Former smoker High risk for cancer Follow-up lung cancer screening protocol Some ILD peripheral lung fields-stable over last few scans No significant lung cancer lung nodules noted Benign at this time   Obesity -recommend significant weight loss -recommend changing diet  Deconditioned state -Recommend increased daily activity and exercise   MEDICATION ADJUSTMENTS/LABS AND TESTS ORDERED: Continue  CPAP as prescribed excellent job A+ Avoid secondhand smoke Avoid SICK contacts Recommend  Masking  when appropriate Recommend Keep up-to-date with vaccinations Follow-up lung cancer screening protocol Recommend weight loss Can use Breztri  as needed   CURRENT MEDICATIONS REVIEWED AT LENGTH WITH PATIENT TODAY   Patient  satisfied with Plan of action and management. All  questions answered  Follow up  1 year  Total Time Spent  48 minutes   Lady Pier, M.D.  [facility]  Medical Director [facility] Medical Director [facility] Cardio-Pulmonary Department

## 2023-08-11 NOTE — Patient Instructions (Signed)
 Excellent Job A+ GOLD STAR!!  Continue CPAP as prescribed  Patient Instructions Continue to use CPAP every night, minimum of 4-6 hours a night.  Change equipment every 30 days or as directed by DME.  Wash your tubing with warm soap and water daily, hang to dry. Wash humidifier portion weekly. Use bottled, distilled water and change daily   Be aware of reduced alertness and do not drive or operate heavy machinery if experiencing this or drowsiness.  Exercise encouraged, as tolerated. Encouraged proper weight management.  Important to get eight or more hours of sleep  Limiting the use of the computer and television before bedtime.  Decrease naps during the day, so night time sleep will become enhanced.  Limit caffeine, and sleep deprivation.    Avoid Allergens and Irritants Avoid secondhand smoke Avoid SICK contacts Recommend  Masking  when appropriate Recommend Keep up-to-date with vaccinations  Continue inhalers as needed No indication for prednisone  or antibiotics at this time  Follow-up lung cancer screening program CT chest findings reviewed in detail with you today

## 2023-08-24 ENCOUNTER — Other Ambulatory Visit: Payer: Self-pay | Admitting: Family Medicine

## 2023-08-24 DIAGNOSIS — E785 Hyperlipidemia, unspecified: Secondary | ICD-10-CM

## 2023-08-24 NOTE — Telephone Encounter (Unsigned)
 Copied from CRM [id number]. Topic: Clinical - Medication Refill >> Aug 24, 2023  3:43 PM Odilia Bennett D wrote: Medication: pravastatin  (PRAVACHOL ) 20 MG tablet (patient was increased to 40mg  by MD Lovetta Rucks in jan 2025 and now she is out of the medication)  Has the patient contacted their pharmacy? No (Agent: If no, request that the patient contact the pharmacy for the refill. If patient does not wish to contact the pharmacy document the reason why and proceed with request.) (Agent: If yes, when and what did the pharmacy advise?)  This is the patient's preferred pharmacy:  [id number] [place], [place] - [address] [place] [address] Phone: [phone] Fax: [phone]  Is this the correct pharmacy for this prescription? Yes If no, delete pharmacy and type the correct one.   Has the prescription been filled recently? No  Is the patient out of the medication? Yes  Has the patient been seen for an appointment in the last year OR does the patient have an upcoming appointment? Yes  Can we respond through MyChart? Yes  Agent: Please be advised that Rx refills may take up to 3 business days. We ask that you follow-up with your pharmacy.

## 2023-08-27 MED ORDER — PRAVASTATIN SODIUM 20 MG PO TABS: 20.0000 mg | ORAL_TABLET | Freq: Every day | ORAL | 0 refills | Status: DC

## 2023-08-28 ENCOUNTER — Ambulatory Visit (INDEPENDENT_AMBULATORY_CARE_PROVIDER_SITE_OTHER)

## 2023-08-28 ENCOUNTER — Other Ambulatory Visit: Payer: Self-pay

## 2023-08-28 VITALS — BP 130/84 | HR 56 | Temp 98.6°F | Ht 65.0 in | Wt 195.4 lb

## 2023-08-28 DIAGNOSIS — E785 Hyperlipidemia, unspecified: Secondary | ICD-10-CM

## 2023-08-28 DIAGNOSIS — R062 Wheezing: Secondary | ICD-10-CM

## 2023-08-28 DIAGNOSIS — J309 Allergic rhinitis, unspecified: Secondary | ICD-10-CM | POA: Diagnosis not present

## 2023-08-28 DIAGNOSIS — K219 Gastro-esophageal reflux disease without esophagitis: Secondary | ICD-10-CM | POA: Diagnosis not present

## 2023-08-28 DIAGNOSIS — J432 Centrilobular emphysema: Secondary | ICD-10-CM

## 2023-08-28 DIAGNOSIS — R051 Acute cough: Secondary | ICD-10-CM | POA: Diagnosis not present

## 2023-08-28 DIAGNOSIS — I10 Essential (primary) hypertension: Secondary | ICD-10-CM

## 2023-08-28 HISTORY — DX: Wheezing: R06.2

## 2023-08-28 MED ORDER — ALBUTEROL SULFATE HFA 108 (90 BASE) MCG/ACT IN AERS: 2.0000 | INHALATION_SPRAY | Freq: Four times a day (QID) | RESPIRATORY_TRACT | 0 refills | Status: DC | PRN

## 2023-08-28 MED ORDER — GUAIFENESIN-CODEINE 100-10 MG/5ML PO SOLN: 10.0000 mL | Freq: Three times a day (TID) | ORAL | 0 refills | Status: DC | PRN

## 2023-08-28 MED ORDER — PREDNISONE 20 MG PO TABS: 20.0000 mg | ORAL_TABLET | Freq: Every day | ORAL | 0 refills | Status: DC

## 2023-08-28 MED ORDER — PRAVASTATIN SODIUM 40 MG PO TABS: 40.0000 mg | ORAL_TABLET | Freq: Every day | ORAL | 0 refills | Status: DC

## 2023-08-28 MED ORDER — FLUTICASONE PROPIONATE 50 MCG/ACT NA SUSP: 2.0000 | Freq: Every day | NASAL | 1 refills | Status: DC

## 2023-08-28 MED ORDER — FEXOFENADINE HCL 180 MG PO TABS: 180.0000 mg | ORAL_TABLET | Freq: Every day | ORAL | 1 refills | Status: AC | PRN

## 2023-08-28 MED ORDER — PANTOPRAZOLE SODIUM 40 MG PO TBEC
40.0000 mg | DELAYED_RELEASE_TABLET | Freq: Every day | ORAL | 0 refills | Status: DC
Start: 2023-08-28 — End: 2023-10-28

## 2023-08-28 MED ORDER — VALSARTAN 160 MG PO TABS: 160.0000 mg | ORAL_TABLET | Freq: Every day | ORAL | 0 refills | Status: DC

## 2023-08-28 NOTE — Progress Notes (Signed)
 Acute Office Visit  Subjective:    Patient ID: Linda Henson, female    DOB: 01-01-1956, 68 y.o.   MRN: [id number]  Chief Complaint  Patient presents with   Cough   Patient is in today for following acute concerns:  -- Cough for one week, initially was dry in nature. Now she is having yellow phlegm production. Tmax at home 99. No chest pain but ribs pain with cough. She has been using nasal saline nasal rinses. She has also been using Breztri  twice a day for the last week. She had been to [employer] prior to her symptoms started. Symptoms initially started at sore throat, body ache which is now resolved. Patient reports she gets occasional stress incontinence and with this cough she is noticing more incontinence. No back pain, dysuria.  -- Low dose CT from 06/01/23: ILD, interstitial pneumonitis, pulmonary hypertension and emphysema. Has a h/o smoking, does not smoke and is not exposed to second hand smoking. No facial pressure, ear pain.  -- Pulmonary appointment on 08/11/23: Documentation from this appointment states use Breztri  as needed.  -- She takes Protonix  40 mg once a day for GERD. Has tried Benzonatate in the past for cough and did not help relieving cough.  Needs refill. Has been on Valsartan  for hypertension for 2 years. Needs refill.    -- Hyperlipidemia: She is on Pravastatin , is supposed to be taking Pravastatin  40 mg daily. She did not receive refill on this so, has been taking Pravastatin  20 mg two tabs.    As per HPI    Objective:    BP 130/84   Pulse (!) 56   Temp 98.6 F (37 C) (Oral)   Ht 5' 5 (1.651 m)   Wt 195 lb 6.4 oz (88.6 kg)   SpO2 97%   BMI 32.52 kg/m    Physical Exam Constitutional:      Appearance: Normal appearance.  HENT:     Head: Normocephalic and atraumatic.     Right Ear: Tympanic membrane is not erythematous, retracted or bulging.     Left Ear: Tympanic membrane is not erythematous, retracted or bulging.     Nose:     Right  Sinus: No maxillary sinus tenderness or frontal sinus tenderness.     Left Sinus: No maxillary sinus tenderness or frontal sinus tenderness.     Mouth/Throat:     Mouth: Mucous membranes are moist.  Neck:     Thyroid : No thyroid  mass or thyroid  tenderness.   Cardiovascular:     Rate and Rhythm: Normal rate and regular rhythm.  Pulmonary:     Breath sounds: Wheezing (bilateral wheezing, inspiration triggers cough.) present. No rales.  Abdominal:     General: Bowel sounds are normal.     Palpations: Abdomen is soft.   Musculoskeletal:     Cervical back: Neck supple. No rigidity.     Right lower leg: No edema.     Left lower leg: No edema.   Skin:    General: Skin is warm.   Neurological:     Mental Status: She is alert and oriented to person, place, and time.   Psychiatric:        Mood and Affect: Mood normal.        Behavior: Behavior normal.    No results found for any visits on 08/28/23.     Assessment & Plan:  Allergic rhinitis, unspecified seasonality, unspecified trigger Assessment & Plan: - Nasal Flonase , 2 puffs daily  and Allegra 180 mg once a day as needed for allergy symptoms.  Orders: -     Fluticasone  Propionate; Place 2 sprays into both nostrils daily.  Dispense: 16 g; Refill: 1  Acute cough Assessment & Plan: Differential diagnosis includes viral URI with cough, bronchitis, GERD, COPD exacerbation, postnasal drip.  Given prodromal symptoms prior to cough onset likely viral URI with cough.  - Recommend guaifenesin -codeine  100-10 mg/ml, 10 mL 3 times a day as needed.  Patient was counseled on potential side effects related to this medication.   - Prednisone  20 mg, take every morning with food for 5 days. - Continue Breztri  2 puffs twice daily and as needed albuterol  2 puffs every 6 hourly as needed for wheezing. - If symptoms persist despite above measure or worsens recommend patient seek immediate medical care.  Also counseled patient on nature of viral URI  and duration of cough ranging anywhere from 1 to 6 weeks.  Will consider getting chest x-ray if symptoms persist.    Orders: -     guaiFENesin -Codeine ; Take 10 mLs by mouth 3 (three) times daily as needed for cough.  Dispense: 120 mL; Refill: 0 -     predniSONE ; Take 1 tablet (20 mg total) by mouth daily with breakfast.  Dispense: 5 tablet; Refill: 0  Wheezing Assessment & Plan: Management and plan for acute cough documented on today's visit.  Orders: -     Albuterol  Sulfate HFA; Inhale 2 puffs into the lungs every 6 (six) hours as needed for wheezing or shortness of breath.  Dispense: 8 g; Refill: 0 -     guaiFENesin -Codeine ; Take 10 mLs by mouth 3 (three) times daily as needed for cough.  Dispense: 120 mL; Refill: 0 -     predniSONE ; Take 1 tablet (20 mg total) by mouth daily with breakfast.  Dispense: 5 tablet; Refill: 0  Centrilobular emphysema (HCC) Assessment & Plan: Plan per acute cough.  If cough persist recommend getting chest x-ray and having patient follow-up with pulmonology as well.   Gastroesophageal reflux disease, unspecified whether esophagitis present Assessment & Plan: Stable on Protonix  40 mg once a day.  Needs refill.  Consider checking B12 and magnesium during next visit.  Orders: -     Pantoprazole  Sodium; Take 1 tablet (40 mg total) by mouth daily.  Dispense: 90 tablet; Refill: 0  Primary hypertension Assessment & Plan: Blood pressure controlled on valsartan  160 mg daily.  Continue.  Patient needs refill.  Refill sent  Orders: -     Valsartan ; Take 1 tablet (160 mg total) by mouth daily.  Dispense: 90 tablet; Refill: 0  Other orders -     Fexofenadine HCl; Take 1 tablet (180 mg total) by mouth daily as needed for allergies or rhinitis.  Dispense: 90 tablet; Refill: 1   I spent 35 minutes on the day of this face-to-face encounter reviewing the patient's medical and surgical history, medications, ongoing concerns, and reviewing the assessment and plan  with the patient.   Return if symptoms worsen or fail to improve. Keep TOC for 10/2023.  Jacklin Mascot, MD

## 2023-08-28 NOTE — Assessment & Plan Note (Signed)
 Plan per acute cough.  If cough persist recommend getting chest x-ray and having patient follow-up with pulmonology as well.

## 2023-08-28 NOTE — Assessment & Plan Note (Signed)
-   Nasal Flonase , 2 puffs daily and Allegra 180 mg once a day as needed for allergy symptoms.

## 2023-08-28 NOTE — Assessment & Plan Note (Signed)
 Blood pressure controlled on valsartan  160 mg daily.  Continue.  Patient needs refill.  Refill sent

## 2023-08-28 NOTE — Assessment & Plan Note (Signed)
 Management and plan for acute cough documented on today's visit.

## 2023-08-28 NOTE — Assessment & Plan Note (Addendum)
 Differential diagnosis includes viral URI with cough, bronchitis, GERD, COPD exacerbation, postnasal drip.  Given prodromal symptoms prior to cough onset likely viral URI with cough.  - Recommend guaifenesin -codeine  100-10 mg/ml, 10 mL 3 times a day as needed.  Patient was counseled on potential side effects related to this medication.   - Prednisone  20 mg, take every morning with food for 5 days. - Continue Breztri  2 puffs twice daily and as needed albuterol  2 puffs every 6 hourly as needed for wheezing. - If symptoms persist despite above measure or worsens recommend patient seek immediate medical care.  Also counseled patient on nature of viral URI and duration of cough ranging anywhere from 1 to 6 weeks.  Will consider getting chest x-ray if symptoms persist.

## 2023-08-28 NOTE — Assessment & Plan Note (Signed)
 Stable on Protonix  40 mg once a day.  Needs refill.  Consider checking B12 and magnesium during next visit.

## 2023-09-22 ENCOUNTER — Other Ambulatory Visit: Payer: Self-pay

## 2023-09-22 DIAGNOSIS — J309 Allergic rhinitis, unspecified: Secondary | ICD-10-CM

## 2023-10-01 ENCOUNTER — Encounter: Payer: PPO | Admitting: Family Medicine

## 2023-10-16 DIAGNOSIS — G4733 Obstructive sleep apnea (adult) (pediatric): Secondary | ICD-10-CM | POA: Diagnosis not present

## 2023-10-23 DIAGNOSIS — H524 Presbyopia: Secondary | ICD-10-CM | POA: Diagnosis not present

## 2023-10-23 DIAGNOSIS — H52203 Unspecified astigmatism, bilateral: Secondary | ICD-10-CM | POA: Diagnosis not present

## 2023-10-28 ENCOUNTER — Ambulatory Visit

## 2023-10-28 ENCOUNTER — Ambulatory Visit: Payer: Self-pay

## 2023-10-28 ENCOUNTER — Ambulatory Visit (INDEPENDENT_AMBULATORY_CARE_PROVIDER_SITE_OTHER)

## 2023-10-28 VITALS — BP 120/70 | HR 52 | Temp 98.3°F | Ht 65.0 in | Wt 200.4 lb

## 2023-10-28 DIAGNOSIS — G4733 Obstructive sleep apnea (adult) (pediatric): Secondary | ICD-10-CM | POA: Diagnosis not present

## 2023-10-28 DIAGNOSIS — R140 Abdominal distension (gaseous): Secondary | ICD-10-CM | POA: Diagnosis not present

## 2023-10-28 DIAGNOSIS — F39 Unspecified mood [affective] disorder: Secondary | ICD-10-CM | POA: Diagnosis not present

## 2023-10-28 DIAGNOSIS — I10 Essential (primary) hypertension: Secondary | ICD-10-CM

## 2023-10-28 DIAGNOSIS — E785 Hyperlipidemia, unspecified: Secondary | ICD-10-CM

## 2023-10-28 DIAGNOSIS — R197 Diarrhea, unspecified: Secondary | ICD-10-CM | POA: Diagnosis not present

## 2023-10-28 DIAGNOSIS — R7309 Other abnormal glucose: Secondary | ICD-10-CM

## 2023-10-28 DIAGNOSIS — K219 Gastro-esophageal reflux disease without esophagitis: Secondary | ICD-10-CM | POA: Diagnosis not present

## 2023-10-28 DIAGNOSIS — K644 Residual hemorrhoidal skin tags: Secondary | ICD-10-CM

## 2023-10-28 DIAGNOSIS — E559 Vitamin D deficiency, unspecified: Secondary | ICD-10-CM

## 2023-10-28 DIAGNOSIS — F1090 Alcohol use, unspecified, uncomplicated: Secondary | ICD-10-CM

## 2023-10-28 DIAGNOSIS — E538 Deficiency of other specified B group vitamins: Secondary | ICD-10-CM

## 2023-10-28 DIAGNOSIS — Z79899 Other long term (current) drug therapy: Secondary | ICD-10-CM

## 2023-10-28 DIAGNOSIS — R001 Bradycardia, unspecified: Secondary | ICD-10-CM

## 2023-10-28 DIAGNOSIS — G4709 Other insomnia: Secondary | ICD-10-CM | POA: Diagnosis not present

## 2023-10-28 DIAGNOSIS — K648 Other hemorrhoids: Secondary | ICD-10-CM

## 2023-10-28 LAB — VITAMIN B12: Vitamin B-12: 253 pg/mL (ref 211–911)

## 2023-10-28 LAB — CBC WITH DIFFERENTIAL/PLATELET
Basophils Absolute: 0 K/uL (ref 0.0–0.1)
Basophils Relative: 0.5 % (ref 0.0–3.0)
Eosinophils Absolute: 0.3 K/uL (ref 0.0–0.7)
Eosinophils Relative: 3.7 % (ref 0.0–5.0)
HCT: 41.1 % (ref 36.0–46.0)
Hemoglobin: 14.3 g/dL (ref 12.0–15.0)
Lymphocytes Relative: 28 % (ref 12.0–46.0)
Lymphs Abs: 2.6 K/uL (ref 0.7–4.0)
MCHC: 34.7 g/dL (ref 30.0–36.0)
MCV: 97.7 fl (ref 78.0–100.0)
Monocytes Absolute: 0.5 K/uL (ref 0.1–1.0)
Monocytes Relative: 5.3 % (ref 3.0–12.0)
Neutro Abs: 5.8 K/uL (ref 1.4–7.7)
Neutrophils Relative %: 62.5 % (ref 43.0–77.0)
Platelets: 185 K/uL (ref 150.0–400.0)
RBC: 4.21 Mil/uL (ref 3.87–5.11)
RDW: 13 % (ref 11.5–15.5)
WBC: 9.3 K/uL (ref 4.0–10.5)

## 2023-10-28 LAB — COMPREHENSIVE METABOLIC PANEL WITH GFR
ALT: 14 U/L (ref 0–35)
AST: 11 U/L (ref 0–37)
Albumin: 4.4 g/dL (ref 3.5–5.2)
Alkaline Phosphatase: 52 U/L (ref 39–117)
BUN: 15 mg/dL (ref 6–23)
CO2: 26 meq/L (ref 19–32)
Calcium: 9.4 mg/dL (ref 8.4–10.5)
Chloride: 104 meq/L (ref 96–112)
Creatinine, Ser: 0.86 mg/dL (ref 0.40–1.20)
GFR: 69.34 mL/min (ref >60.00)
Glucose, Bld: 122 mg/dL — ABNORMAL HIGH (ref 70–99)
Potassium: 4.1 meq/L (ref 3.5–5.1)
Sodium: 139 meq/L (ref 135–145)
Total Bilirubin: 0.7 mg/dL (ref 0.2–1.2)
Total Protein: 7.2 g/dL (ref 6.0–8.3)

## 2023-10-28 LAB — HEMOGLOBIN A1C: Hgb A1c MFr Bld: 5.9 % (ref 4.6–6.5)

## 2023-10-28 LAB — TSH: TSH: 1.75 u[IU]/mL (ref 0.35–5.50)

## 2023-10-28 LAB — VITAMIN D 25 HYDROXY (VIT D DEFICIENCY, FRACTURES): VITD: 23.61 ng/mL — ABNORMAL LOW (ref 30.00–100.00)

## 2023-10-28 MED ORDER — PRAVASTATIN SODIUM 40 MG PO TABS: 40.0000 mg | ORAL_TABLET | Freq: Every day | ORAL | 3 refills | Status: AC

## 2023-10-28 MED ORDER — VALSARTAN 160 MG PO TABS: 160.0000 mg | ORAL_TABLET | Freq: Every day | ORAL | 3 refills | Status: DC

## 2023-10-28 MED ORDER — TRAZODONE HCL 50 MG PO TABS
ORAL_TABLET | ORAL | 0 refills | Status: DC
Start: 2023-10-28 — End: 2024-02-09

## 2023-10-28 MED ORDER — PANTOPRAZOLE SODIUM 40 MG PO TBEC: 40.0000 mg | DELAYED_RELEASE_TABLET | Freq: Every day | ORAL | 3 refills | Status: AC

## 2023-10-28 NOTE — Assessment & Plan Note (Signed)
 Plan per external hemorrhoids.

## 2023-10-28 NOTE — Assessment & Plan Note (Signed)
 Compliant with CPAP, continue f/u with pulmonology

## 2023-10-28 NOTE — Assessment & Plan Note (Signed)
 Chronic, asymptomatic with previous cardiology evaluation with reassuring result.  Advised to seek medical attention for palpitations, excessive fatigue, chest pain, dizziness or shortness of breath.

## 2023-10-28 NOTE — Assessment & Plan Note (Signed)
 Check B 12, serum Mg due to chronic use of Protonix  for GERD

## 2023-10-28 NOTE — Assessment & Plan Note (Signed)
 Chronic issue. Without SI/HI. Insomnia which has been managed with prn 25 mg Trazodone  which does not always help. She can try adding Melatonin 1 mg an hour before bedtime to help with sleep and continue PRN Trazodone . Alcohol cessation counseling provided. She will monitor.

## 2023-10-28 NOTE — Progress Notes (Signed)
 Please let the patient know recent abdominal imaging notes a "paucity of small and large bowel gas in the abdomen and pelvis," which means there is less gas than usual in your intestines, which can sometimes be seen in cases of bowel obstruction or other conditions affecting the movement of the intestines.  Given these findings, I recommend stop taking Metamucil and obtain CT abdomen and pelvis to further evaluation and rule out bowel obstruction. If patient is agreeable I will put in an order.  I recommend she should be evaluated in the ED if she develops abdominal pain, bloating, vomiting, constipation, or inability to pass gas.   Thank you,  Luke Shade, MD

## 2023-10-28 NOTE — Assessment & Plan Note (Signed)
 Chronic issue. Currently drinking about 2 bourbon per night. I encouraged her to continue to work on cutting back on her alcohol intake for her overall health and liver health.  Will continue to follow up. Check CMP today.

## 2023-10-28 NOTE — Assessment & Plan Note (Signed)
 Insomnia which has been managed with prn 25 mg Trazodone  which does not always help. She can try adding Melatonin 1 mg an hour before bedtime to help with sleep and continue PRN Trazodone . Alcohol cessation counseling provided. She will monitor.

## 2023-10-28 NOTE — Assessment & Plan Note (Signed)
 Previous A1c with prediabetic range.  Repeat A1c today. Counseled on reducing intake of carbohydrate/sugar rich food. Encouraged regular exercise.

## 2023-10-28 NOTE — Assessment & Plan Note (Signed)
 Vitamin D  level as been stable since taking daily vitamin D  supplement 2000 units, continue. Check vitamin D  level today.

## 2023-10-28 NOTE — Assessment & Plan Note (Signed)
 Blood pressure controlled on valsartan  160 mg daily.  Continue.  Check CMP

## 2023-10-28 NOTE — Assessment & Plan Note (Signed)
 With h/o GI ulcer in the past. Symptoms stable with Protonix  40 mg daily. Continue, refill sent. Check B 12, serum Mg due to chronic use of Protonix .

## 2023-10-28 NOTE — Assessment & Plan Note (Addendum)
 Internal and external hemorrhoids, diverticulosis  evident on colonoscopy from 08/19/2021.  Loose bowel movement 4 episodes mostly in the morning for about 2 months. No bleeding, unintentional weight loss, abdominal pain, nausea, vomiting. Check KUB to r/o overflow diarrhea. Check CBC, CMP, TSH. Counseled on increasing daily water intake to 40-50 oz, regular exercise, high fiber food. She is taking Metamucil as well for constipation. Wait for imaging and lab results to be back. Will counsel patient to hold off on taking Metamucil as this could be contributing to loose BM as well. Patient is established with [facility], I recommend f/u with them. If she would like to see different GI group she will reach out to our clinic.

## 2023-10-28 NOTE — Progress Notes (Signed)
 Established Patient Office Visit TOC from Dr. Maribeth    Subjective  Patient ID: Linda Henson, female    DOB: 27-Mar-1955  Age: 68 y.o. MRN: [id number]  Chief Complaint  Patient presents with   Establish Care    She  has a past medical history of Arthritis, Chronic dyspnea (12/05/2020), Colon polyp, COPD (chronic obstructive pulmonary disease) (HCC), Diverticulitis, GERD (gastroesophageal reflux disease), Hepatitis C (2018), History of gastric ulcer, Hyperlipidemia, Hypertension, Iron deficiency anemia (2014), LUQ pain (11/12/2014), Myalgia (05/26/2022), Pain and swelling of left lower leg (05/30/2020), and Sleep apnea.  HPI Discussed the use of AI scribe software for clinical note transcription with the patient, who gave verbal consent to proceed.  History of Present Illness Linda Henson is a 68 year old female presenting for TOC.   - Over the past two to three months, she has experienced significant changes in her bowel habits, characterized by loose, mushy stools occurring four to five times every morning. She describes these bowel movements as 'fast and furious' and sometimes appearing to come out to one side. There is no recent blood in her stool, although she has a history of hemorrhoids and previously experienced blood in her stool until last year. She has not experienced any fever, chills, unintentional weight loss, or vomiting, but mentions occasional stomach pain and bloating. Her last colonoscopy was in June 2023.  -  She has a history of sleep apnea and uses a CPAP machine every night. Established with pulmonology.   - GERD managed with Protonix , a gastric ulcer, and was treated for hepatitis C in 2018. No abdominal pain, nausea, vomiting, unintentional weight loss. Has seen GI [facility].   - Has a h/o vitamin D  deficiency, takes daily vitamin D  supplement.   - Has a h/o bradycardia, (asymptomatic) Has seen cardiology in the past. No chest pain, lower leg edema,  palpitation. H/O HTN, hyperlipidemia on Pravastatin  40 mg daily, Valsartan  160 mg daily.   - Socially, she drinks about a fifth of bourbon per week. She does not smoke. She walks her dogs five days a week for about 45 minutes and plays golf twice a week. She has gained some weight recently, having lost 20 pounds but regained 10. She lives with her daughter, who has a history of drug use and seizures, and is currently in a rehabilitation program.  - Seasonal allergic rhinitis: Takes Allegra  as needed, Flonase  seasonally.  - She also takes trazodone  25 mg as needed for sleep, although she reports it is not very effective, and she often wakes up after five to six hours of sleep.    ROS As per HPI    Objective:     BP 120/70 (BP Location: Right Arm, Cuff Size: Normal)   Pulse (!) 52   Temp 98.3 F (36.8 C) (Oral)   Ht 5' 5 (1.651 m)   Wt 200 lb 6.4 oz (90.9 kg)   SpO2 98%   BMI 33.35 kg/m      10/28/2023   10:10 AM 08/28/2023    8:19 AM 06/03/2023    2:46 PM  Depression screen PHQ 2/9  Decreased Interest 0 0 0  Down, Depressed, Hopeless 0 0 0  PHQ - 2 Score 0 0 0  Altered sleeping 3 0 3  Tired, decreased energy 3 0 2  Change in appetite 2 0 0  Feeling bad or failure about yourself  0 0 0  Trouble concentrating 0 0 0  Moving slowly or fidgety/restless  0 0 0  Suicidal thoughts 0 0 0  PHQ-9 Score 8 0 5  Difficult doing work/chores  Not difficult at all Not difficult at all      10/28/2023   10:10 AM 08/28/2023    8:19 AM 06/03/2023    2:47 PM 04/03/2023    8:13 AM  GAD 7 : Generalized Anxiety Score  Nervous, Anxious, on Edge 2 0 0 1  Control/stop worrying 1 0 0 1  Worry too much - different things 1 0 0 1  Trouble relaxing 1 0 0 1  Restless 0 0 0 0  Easily annoyed or irritable 2 0 0 1  Afraid - awful might happen 2 3 3 2   Total GAD 7 Score 9 3 3 7   Anxiety Difficulty   Not difficult at all Not difficult at all      10/28/2023   10:10 AM 08/28/2023    8:19 AM 06/03/2023     2:46 PM  Depression screen PHQ 2/9  Decreased Interest 0 0 0  Down, Depressed, Hopeless 0 0 0  PHQ - 2 Score 0 0 0  Altered sleeping 3 0 3  Tired, decreased energy 3 0 2  Change in appetite 2 0 0  Feeling bad or failure about yourself  0 0 0  Trouble concentrating 0 0 0  Moving slowly or fidgety/restless 0 0 0  Suicidal thoughts 0 0 0  PHQ-9 Score 8 0 5  Difficult doing work/chores  Not difficult at all Not difficult at all      10/28/2023   10:10 AM 08/28/2023    8:19 AM 06/03/2023    2:47 PM 04/03/2023    8:13 AM  GAD 7 : Generalized Anxiety Score  Nervous, Anxious, on Edge 2 0 0 1  Control/stop worrying 1 0 0 1  Worry too much - different things 1 0 0 1  Trouble relaxing 1 0 0 1  Restless 0 0 0 0  Easily annoyed or irritable 2 0 0 1  Afraid - awful might happen 2 3 3 2   Total GAD 7 Score 9 3 3 7   Anxiety Difficulty   Not difficult at all Not difficult at all   SDOH Screenings   Food Insecurity: No Food Insecurity (04/08/2023)  Housing: Low Risk  (04/08/2023)  Transportation Needs: No Transportation Needs (04/08/2023)  Utilities: Not At Risk (04/08/2023)  Alcohol Screen: Medium Risk (04/08/2023)  Depression (PHQ2-9): Medium Risk (10/28/2023)  Financial Resource Strain: Low Risk  (04/08/2023)  Physical Activity: Insufficiently Active (04/08/2023)  Social Connections: Moderately Isolated (04/08/2023)  Stress: Stress Concern Present (04/08/2023)  Tobacco Use: Medium Risk (10/28/2023)  Health Literacy: Adequate Health Literacy (04/08/2023)     Physical Exam Constitutional:      General: She is not in acute distress. HENT:     Head: Normocephalic and atraumatic.     Right Ear: Tympanic membrane normal.     Left Ear: Tympanic membrane normal.     Nose: Nose normal.     Mouth/Throat:     Mouth: Mucous membranes are moist.  Eyes:     General: No scleral icterus.    Pupils: Pupils are equal, round, and reactive to light.  Cardiovascular:     Rate and Rhythm: Bradycardia  present.     Pulses: Normal pulses.     Heart sounds: Normal heart sounds.  Pulmonary:     Effort: Pulmonary effort is normal.     Breath sounds: Normal breath sounds. No wheezing.  Abdominal:     General: Bowel sounds are normal.     Palpations: Abdomen is soft.     Tenderness: There is no abdominal tenderness. There is no guarding or rebound.  Musculoskeletal:     Cervical back: No tenderness.     Right lower leg: No edema.     Left lower leg: No edema.  Lymphadenopathy:     Cervical: No cervical adenopathy.  Skin:    General: Skin is warm.  Neurological:     Mental Status: She is alert and oriented to person, place, and time.  Psychiatric:        Mood and Affect: Mood normal.        No results found for any visits on 10/28/23.  The 10-year ASCVD risk score (Arnett DK, et al., 2019) is: 8.8%     Assessment & Plan:   Other insomnia Assessment & Plan: Insomnia which has been managed with prn 25 mg Trazodone  which does not always help. She can try adding Melatonin 1 mg an hour before bedtime to help with sleep and continue PRN Trazodone . Alcohol cessation counseling provided. She will monitor.   Orders: -     traZODone  HCl; TAKE 1/2 TO 1 TABLET(25 TO 50 MG) BY MOUTH AT BEDTIME AS NEEDED FOR SLEEP  Dispense: 90 tablet; Refill: 0  Abnormal glucose Assessment & Plan: Previous A1c with prediabetic range.  Repeat A1c today. Counseled on reducing intake of carbohydrate/sugar rich food. Encouraged regular exercise.   Orders: -     Hemoglobin A1c  Hyperlipidemia, unspecified hyperlipidemia type -     Pravastatin  Sodium; Take 1 tablet (40 mg total) by mouth daily.  Dispense: 90 tablet; Refill: 3  Primary hypertension Assessment & Plan: Blood pressure controlled on valsartan  160 mg daily.  Continue.  Check CMP  Orders: -     Comprehensive metabolic panel with GFR -     Valsartan ; Take 1 tablet (160 mg total) by mouth daily.  Dispense: 90 tablet; Refill: 3  OSA on  CPAP Assessment & Plan: Compliant with CPAP, continue f/u with pulmonology   Orders: -     CBC with Differential/Platelet  Gastroesophageal reflux disease, unspecified whether esophagitis present Assessment & Plan: With h/o GI ulcer in the past. Symptoms stable with Protonix  40 mg daily. Continue, refill sent. Check B 12, serum Mg due to chronic use of Protonix .   Orders: -     Pantoprazole  Sodium; Take 1 tablet (40 mg total) by mouth daily.  Dispense: 90 tablet; Refill: 3  Mood disorder (HCC) Assessment & Plan: Chronic issue. Without SI/HI. Insomnia which has been managed with prn 25 mg Trazodone  which does not always help. She can try adding Melatonin 1 mg an hour before bedtime to help with sleep and continue PRN Trazodone . Alcohol cessation counseling provided. She will monitor.    Medication management Assessment & Plan: Check B 12, serum Mg due to chronic use of Protonix  for GERD   Orders: -     TSH -     Vitamin B12 -     Magnesium  B12 deficiency  Vitamin D  deficiency Assessment & Plan: Vitamin D  level as been stable since taking daily vitamin D  supplement 2000 units, continue. Check vitamin D  level today.   Orders: -     VITAMIN D  25 Hydroxy (Vit-D Deficiency, Fractures)  Diarrhea, unspecified type -     DG Abd 1 View; Future  Alcohol use Assessment & Plan: Chronic issue. Currently drinking about 2 bourbon  per night. I encouraged her to continue to work on cutting back on her alcohol intake for her overall health and liver health.  Will continue to follow up. Check CMP today.    Bradycardia Assessment & Plan: Chronic, asymptomatic with previous cardiology evaluation with reassuring result.  Advised to seek medical attention for palpitations, excessive fatigue, chest pain, dizziness or shortness of breath.   External hemorrhoids Assessment & Plan: Internal and external hemorrhoids, diverticulosis  evident on colonoscopy from 08/19/2021.  Loose bowel movement  4 episodes mostly in the morning for about 2 months. No bleeding, unintentional weight loss, abdominal pain, nausea, vomiting. Check KUB to r/o overflow diarrhea. Check CBC, CMP, TSH. Counseled on increasing daily water intake to 40-50 oz, regular exercise, high fiber food. She is taking Metamucil as well for constipation. Wait for imaging and lab results to be back. Will counsel patient to hold off on taking Metamucil as this could be contributing to loose BM as well. Patient is established with [facility], I recommend f/u with them. If she would like to see different GI group she will reach out to our clinic.    Internal hemorrhoid Assessment & Plan: Plan per external hemorrhoids.    I personally spent a total of 50 minutes in the care of the patient today including preparing to see the patient, performing a medically appropriate exam/evaluation, counseling and educating, placing orders, documenting clinical information in the EHR, independently interpreting results, and communicating results.   Return in about 6 months (around 04/29/2024) for Chronic .   Luke Shade, MD

## 2023-10-29 LAB — MAGNESIUM: Magnesium: 1.7 mg/dL (ref 1.5–2.5)

## 2023-10-29 NOTE — Progress Notes (Signed)
 Stat CT abdomen pelvis ordered.   Thank you,  Luke Shade, MD

## 2023-10-29 NOTE — Telephone Encounter (Signed)
 Copied from CRM [id number]. Topic: Clinical - Lab/Test Results >> Oct 29, 2023  2:49 PM Burnard DEL wrote: Reason for CRM: Patient returned call to [facility] regarding xray results.Patient verbalized understanding after results were relayed from provider.Patient would like for order for CT scan to be placed.

## 2023-10-29 NOTE — Progress Notes (Signed)
 Duplicate

## 2023-10-29 NOTE — Progress Notes (Signed)
 Noted

## 2023-10-30 ENCOUNTER — Ambulatory Visit: Payer: Self-pay

## 2023-10-30 ENCOUNTER — Ambulatory Visit
Admission: RE | Admit: 2023-10-30 | Discharge: 2023-10-30 | Disposition: A | Discharge status: Home / Self Care | Source: Ambulatory Visit

## 2023-10-30 DIAGNOSIS — K449 Diaphragmatic hernia without obstruction or gangrene: Secondary | ICD-10-CM | POA: Diagnosis not present

## 2023-10-30 DIAGNOSIS — R197 Diarrhea, unspecified: Secondary | ICD-10-CM | POA: Insufficient documentation

## 2023-10-30 DIAGNOSIS — K5730 Diverticulosis of large intestine without perforation or abscess without bleeding: Secondary | ICD-10-CM | POA: Insufficient documentation

## 2023-10-30 DIAGNOSIS — K429 Umbilical hernia without obstruction or gangrene: Secondary | ICD-10-CM | POA: Diagnosis not present

## 2023-10-30 DIAGNOSIS — I700 Atherosclerosis of aorta: Secondary | ICD-10-CM | POA: Diagnosis not present

## 2023-10-30 DIAGNOSIS — R509 Fever, unspecified: Secondary | ICD-10-CM | POA: Diagnosis not present

## 2023-10-30 MED ORDER — VITAMIN D (ERGOCALCIFEROL) 1.25 MG (50000 UNIT) PO CAPS: 50000.0000 [IU] | ORAL_CAPSULE | ORAL | 3 refills | Status: AC

## 2023-10-30 MED ORDER — IOHEXOL 300 MG/ML  SOLN
100.0000 mL | Freq: Once | INTRAMUSCULAR | Status: AC | PRN
  Administered 2023-10-30: 100 mL via INTRAVENOUS

## 2023-10-30 MED ORDER — B-12 100 MCG PO TABS: 1.0000 | ORAL_TABLET | Freq: Every day | ORAL | 3 refills | Status: AC

## 2023-10-30 NOTE — Progress Notes (Signed)
 Following mychart/result note message sent to the patient on 10/30/23 on lab result encounter.    Hello,    Your vitamin D  continues to be low. I am sending you prescription for vitamin D , take 1 tab once weekly. I will continue to monitor your vitamin D  level in the future. Your A1c is stable. CBC is normal. Kidney function, liver function, electrolytes including Magnesium is within normal range.    CT abdomen does not suggest of bowel obstruction. There are a few very small spots seen in both kidneys, but they are too small to fully evaluate and are not concerning at this time. A small sliding hiatal hernia was observed. This is a common finding and likely a reason of heart burn symptoms.  Multiple small pouches (diverticula) are present, mainly in the sigmoid colon, but there are no signs of inflammation (diverticulitis).  I recommend following up with GI clinic for evaluation into diarrhea if you continue to have loose bowel movement. I would hold off on Metamucil for now as that can definitely make diarrhea worse.    B 12 continues to be on low-normal range. I sent a prescription for B 12, take 1 tab daily.    Take care,  Kiyah Demartini, MD

## 2023-12-01 ENCOUNTER — Encounter: Payer: Self-pay | Admitting: Pharmacist

## 2023-12-01 NOTE — Progress Notes (Signed)
 Pharmacy Quality Measure Review  This patient is appearing on a report for being at risk of failing the adherence measure for cholesterol (statin) medications this calendar year.   Medication: pravastatin  40 mg Last fill date: 08/28/23 for 90 day supply  Insurance report was not up to date. No action needed at this time.  Medication has been refilled at [facility] as of 11/21/23 x90 ds. Several refills remaining.

## 2023-12-03 DIAGNOSIS — R197 Diarrhea, unspecified: Secondary | ICD-10-CM | POA: Diagnosis not present

## 2023-12-03 DIAGNOSIS — R1032 Left lower quadrant pain: Secondary | ICD-10-CM | POA: Diagnosis not present

## 2023-12-04 DIAGNOSIS — R1032 Left lower quadrant pain: Secondary | ICD-10-CM | POA: Diagnosis not present

## 2023-12-04 DIAGNOSIS — R197 Diarrhea, unspecified: Secondary | ICD-10-CM | POA: Diagnosis not present

## 2023-12-09 NOTE — H&P (Signed)
 Pre-Procedure H&P   Patient ID: Linda Henson is a 68 y.o. female.  Gastroenterology Provider: Elspeth Ozell Jungling, DO  Referring Provider: Romero Antigua, PA PCP: Abbey Bruckner, MD  Date: 12/10/2023  HPI Linda Henson is a 68 y.o. female who presents today for Colonoscopy for Diarrhea, left lower quadrant pain .  Patient has developed left lower quadrant and rectal pain.  She had diarrhea 5-6 bowel movements a day with nocturnal awakening.  No melena or hematochezia.  Only 1 episode of bright red blood per rectum Diarrhea slightly improved to 3-4 times a day.  CT negative for any findings.  She does use Aleve and liquor products daily.  No family history of colon cancer or colon polyps.  Brother with esophagus and gastric cancer per report.  Last underwent colonoscopy in June 2023 with left-sided diverticulosis internal and external hemorrhoids normal TI and 8 adenomatous polyps were removed  Infectious workup negative creatinine 0.86 hemoglobin 14.3 MCV 97.7 platelets 185,000   Past Medical History:  Diagnosis Date   Arthritis    spine - mild   Chronic dyspnea 12/05/2020   Colon polyp    Repeat colonoscopy 2018   COPD (chronic obstructive pulmonary disease) (HCC)    Diverticulitis    GERD (gastroesophageal reflux disease)    Resolved with wt loss   Hepatitis C 2018   treated with Harvoni   History of gastric ulcer    Hyperlipidemia    Borderline    Hypertension    Iron deficiency anemia 2014   LUQ pain 11/12/2014   Myalgia 05/26/2022   Pain and swelling of left lower leg 05/30/2020   Sleep apnea    CPAP    Past Surgical History:  Procedure Laterality Date   ABDOMINAL HYSTERECTOMY  1994   CATARACT EXTRACTION W/PHACO Right 08/30/2019   Procedure: CATARACT EXTRACTION PHACO AND INTRAOCULAR LENS PLACEMENT (IOC) RIGHT;  Surgeon: Jaye Fallow, MD;  Location: [facility];  Service: Ophthalmology;  Laterality: Right;  2.16 0:20.2   COLONOSCOPY  N/A 08/19/2021   Procedure: COLONOSCOPY;  Surgeon: Jungling Elspeth Ozell, DO;  Location: [facility] ENDOSCOPY;  Service: Gastroenterology;  Laterality: N/A;   COLONOSCOPY WITH PROPOFOL  N/A 02/05/2015   Procedure: COLONOSCOPY WITH PROPOFOL ;  Surgeon: Donnice Vaughn Manes, MD;  Location: [facility] ENDOSCOPY;  Service: Endoscopy;  Laterality: N/A;   EYE SURGERY     SALPINGOOPHORECTOMY Right 1994    Family History Brother with esophagus and gastric cancer per report. No other h/o GI disease or malignancy  Review of Systems  Constitutional:  Negative for activity change, appetite change, chills, diaphoresis, fatigue, fever and unexpected weight change.  HENT:  Negative for trouble swallowing and voice change.   Respiratory:  Negative for shortness of breath and wheezing.   Cardiovascular:  Negative for chest pain, palpitations and leg swelling.  Gastrointestinal:  Positive for abdominal pain and diarrhea. Negative for abdominal distention, anal bleeding, blood in stool, constipation, nausea, rectal pain and vomiting.  Musculoskeletal:  Negative for arthralgias and myalgias.  Skin:  Negative for color change and pallor.  Neurological:  Negative for dizziness, syncope and weakness.  Psychiatric/Behavioral:  Negative for confusion.   All other systems reviewed and are negative.    Medications No current facility-administered medications on file prior to encounter.   Current Outpatient Medications on File Prior to Encounter  Medication Sig Dispense Refill   Calcium  Carb-Cholecalciferol (CALCIUM  500 + D PO) Take 1 tablet by mouth daily.     Cholecalciferol (VITAMIN D3)  50 MCG (2000 UT) CAPS Take 1 capsule by mouth daily.     Cyanocobalamin  (B-12) 100 MCG TABS Take 1 tablet by mouth daily. 90 tablet 3   fexofenadine  (ALLEGRA  ALLERGY) 180 MG tablet Take 1 tablet (180 mg total) by mouth daily as needed for allergies or rhinitis. 90 tablet 1   fluticasone  (FLONASE ) 50 MCG/ACT nasal spray SPRAY 2 SPRAYS INTO  EACH NOSTRIL EVERY DAY 18.2 mL 3   pantoprazole  (PROTONIX ) 40 MG tablet Take 1 tablet (40 mg total) by mouth daily. 90 tablet 3   pravastatin  (PRAVACHOL ) 40 MG tablet Take 1 tablet (40 mg total) by mouth daily. 90 tablet 3   valsartan  (DIOVAN ) 160 MG tablet Take 1 tablet (160 mg total) by mouth daily. 90 tablet 3   Vitamin D , Ergocalciferol , (DRISDOL ) 1.25 MG (50000 UNIT) CAPS capsule Take 1 capsule (50,000 Units total) by mouth every 7 (seven) days. 12 capsule 3   clobetasol  ointment (TEMOVATE ) 0.05 % Apply 1 Application topically 2 (two) times daily. Apply to aa's BID PRN flares. 60 g 1   traZODone  (DESYREL ) 50 MG tablet TAKE 1/2 TO 1 TABLET(25 TO 50 MG) BY MOUTH AT BEDTIME AS NEEDED FOR SLEEP 90 tablet 0    Pertinent medications related to GI and procedure were reviewed by me with the patient prior to the procedure   Current Facility-Administered Medications:    0.9 %  sodium chloride  infusion, , Intravenous, Continuous, Onita Elspeth Sharper, DO, Last Rate: 20 mL/hr at 12/10/23 0723, New Bag at 12/10/23 0723  sodium chloride  20 mL/hr at 12/10/23 9276       No Known Allergies Allergies were reviewed by me prior to the procedure  Objective   Body mass index is 33.45 kg/m. Vitals:   12/10/23 0717  BP: (!) 179/89  Pulse: (!) 55  Temp: (!) 97.1 F (36.2 C)  TempSrc: Temporal  SpO2: 97%  Weight: 91.2 kg     Physical Exam Vitals and nursing note reviewed.  Constitutional:      General: She is not in acute distress.    Appearance: Normal appearance. She is obese. She is not ill-appearing, toxic-appearing or diaphoretic.  HENT:     Head: Normocephalic and atraumatic.     Nose: Nose normal.     Mouth/Throat:     Mouth: Mucous membranes are moist.     Pharynx: Oropharynx is clear.  Eyes:     General: No scleral icterus.    Extraocular Movements: Extraocular movements intact.  Cardiovascular:     Rate and Rhythm: Regular rhythm. Bradycardia present.     Heart sounds:  Normal heart sounds. No murmur heard.    No friction rub. No gallop.  Pulmonary:     Effort: Pulmonary effort is normal. No respiratory distress.     Breath sounds: Normal breath sounds. No wheezing, rhonchi or rales.  Abdominal:     General: Bowel sounds are normal. There is no distension.     Palpations: Abdomen is soft.     Tenderness: There is no abdominal tenderness. There is no guarding or rebound.  Musculoskeletal:     Cervical back: Neck supple.     Right lower leg: No edema.     Left lower leg: No edema.  Skin:    General: Skin is warm and dry.     Coloration: Skin is not jaundiced or pale.  Neurological:     General: No focal deficit present.     Mental Status: She is alert and oriented  to person, place, and time. Mental status is at baseline.  Psychiatric:        Mood and Affect: Mood normal.        Behavior: Behavior normal.        Thought Content: Thought content normal.        Judgment: Judgment normal.      Assessment:  Linda Henson is a 68 y.o. female  who presents today for Colonoscopy for diarrhea, LLQ Pain.  Plan:  Colonoscopy with possible intervention today  Colonoscopy with possible biopsy, control of bleeding, polypectomy, and interventions as necessary has been discussed with the patient/patient representative. Informed consent was obtained from the patient/patient representative after explaining the indication, nature, and risks of the procedure including but not limited to death, bleeding, perforation, missed neoplasm/lesions, cardiorespiratory compromise, and reaction to medications. Opportunity for questions was given and appropriate answers were provided. Patient/patient representative has verbalized understanding is amenable to undergoing the procedure.   Elspeth Ozell Jungling, DO  [facility]  Portions of the record may have been created with voice recognition software. Occasional wrong-word or 'sound-a-like'  substitutions may have occurred due to the inherent limitations of voice recognition software.  Read the chart carefully and recognize, using context, where substitutions may have occurred.

## 2023-12-10 ENCOUNTER — Encounter: Payer: Self-pay | Admitting: Gastroenterology

## 2023-12-10 ENCOUNTER — Other Ambulatory Visit: Payer: Self-pay

## 2023-12-10 ENCOUNTER — Ambulatory Visit: Admitting: Anesthesiology

## 2023-12-10 ENCOUNTER — Ambulatory Visit
Admission: RE | Admit: 2023-12-10 | Discharge: 2023-12-10 | Disposition: A | Discharge status: Home / Self Care | Attending: Gastroenterology | Admitting: Gastroenterology

## 2023-12-10 ENCOUNTER — Encounter
Admission: RE | Disposition: A | Discharge status: Home / Self Care | Payer: Self-pay | Source: Home / Self Care | Attending: Gastroenterology

## 2023-12-10 DIAGNOSIS — K759 Inflammatory liver disease, unspecified: Secondary | ICD-10-CM | POA: Insufficient documentation

## 2023-12-10 DIAGNOSIS — Z79899 Other long term (current) drug therapy: Secondary | ICD-10-CM | POA: Diagnosis not present

## 2023-12-10 DIAGNOSIS — E785 Hyperlipidemia, unspecified: Secondary | ICD-10-CM | POA: Insufficient documentation

## 2023-12-10 DIAGNOSIS — Z87891 Personal history of nicotine dependence: Secondary | ICD-10-CM | POA: Diagnosis not present

## 2023-12-10 DIAGNOSIS — K649 Unspecified hemorrhoids: Secondary | ICD-10-CM | POA: Diagnosis not present

## 2023-12-10 DIAGNOSIS — Z860100 Personal history of colon polyps, unspecified: Secondary | ICD-10-CM | POA: Insufficient documentation

## 2023-12-10 DIAGNOSIS — G4730 Sleep apnea, unspecified: Secondary | ICD-10-CM | POA: Diagnosis not present

## 2023-12-10 DIAGNOSIS — K5730 Diverticulosis of large intestine without perforation or abscess without bleeding: Secondary | ICD-10-CM | POA: Insufficient documentation

## 2023-12-10 DIAGNOSIS — R1032 Left lower quadrant pain: Secondary | ICD-10-CM | POA: Diagnosis not present

## 2023-12-10 DIAGNOSIS — R197 Diarrhea, unspecified: Secondary | ICD-10-CM | POA: Diagnosis not present

## 2023-12-10 DIAGNOSIS — K644 Residual hemorrhoidal skin tags: Secondary | ICD-10-CM | POA: Diagnosis not present

## 2023-12-10 DIAGNOSIS — I10 Essential (primary) hypertension: Secondary | ICD-10-CM | POA: Insufficient documentation

## 2023-12-10 DIAGNOSIS — K219 Gastro-esophageal reflux disease without esophagitis: Secondary | ICD-10-CM | POA: Insufficient documentation

## 2023-12-10 DIAGNOSIS — K648 Other hemorrhoids: Secondary | ICD-10-CM | POA: Diagnosis not present

## 2023-12-10 DIAGNOSIS — J449 Chronic obstructive pulmonary disease, unspecified: Secondary | ICD-10-CM | POA: Insufficient documentation

## 2023-12-10 HISTORY — PX: COLONOSCOPY: SHX5424

## 2023-12-10 SURGERY — COLONOSCOPY
Anesthesia: General

## 2023-12-10 MED ORDER — PROPOFOL 1000 MG/100ML IV EMUL
INTRAVENOUS | Status: AC
  Filled 2023-12-10: qty 100

## 2023-12-10 MED ORDER — PROPOFOL 10 MG/ML IV BOLUS
INTRAVENOUS | Status: AC
  Filled 2023-12-10: qty 20

## 2023-12-10 MED ORDER — SODIUM CHLORIDE 0.9 % IV SOLN: INTRAVENOUS | Status: DC

## 2023-12-10 MED ORDER — DEXMEDETOMIDINE HCL IN NACL 80 MCG/20ML IV SOLN
INTRAVENOUS | Status: AC
  Filled 2023-12-10: qty 20

## 2023-12-10 MED ORDER — LIDOCAINE HCL (CARDIAC) PF 100 MG/5ML IV SOSY
PREFILLED_SYRINGE | INTRAVENOUS | Status: DC | PRN
  Administered 2023-12-10: 50 mg via INTRAVENOUS

## 2023-12-10 MED ORDER — PROPOFOL 10 MG/ML IV BOLUS
INTRAVENOUS | Status: DC | PRN
  Administered 2023-12-10: 20 mg via INTRAVENOUS
  Administered 2023-12-10: 80 mg via INTRAVENOUS

## 2023-12-10 MED ORDER — LIDOCAINE HCL (PF) 2 % IJ SOLN
INTRAMUSCULAR | Status: AC
  Filled 2023-12-10: qty 5

## 2023-12-10 MED ORDER — DEXMEDETOMIDINE HCL IN NACL 80 MCG/20ML IV SOLN
INTRAVENOUS | Status: DC | PRN
  Administered 2023-12-10: 4 ug via INTRAVENOUS
  Administered 2023-12-10 (×2): 8 ug via INTRAVENOUS

## 2023-12-10 MED ORDER — PROPOFOL 500 MG/50ML IV EMUL
INTRAVENOUS | Status: DC | PRN
  Administered 2023-12-10: 120 ug/kg/min via INTRAVENOUS

## 2023-12-10 NOTE — Anesthesia Postprocedure Evaluation (Signed)
 Anesthesia Post Note  Patient: Linda Henson  Procedure(s) Performed: COLONOSCOPY  Patient location during evaluation: Endoscopy Anesthesia Type: General Level of consciousness: awake and alert Pain management: pain level controlled Vital Signs Assessment: post-procedure vital signs reviewed and stable Respiratory status: spontaneous breathing, nonlabored ventilation and respiratory function stable Cardiovascular status: blood pressure returned to baseline and stable Postop Assessment: no apparent nausea or vomiting Anesthetic complications: no   No notable events documented.   Last Vitals:  Vitals:   12/10/23 0812 12/10/23 0822  BP: 120/77 137/83  Pulse: (!) 55 (!) 51  Resp: 15 16  Temp:    SpO2: 97% 97%    Last Pain:  Vitals:   12/10/23 0812  TempSrc:   PainSc: 0-No pain                 Fairy POUR [name]

## 2023-12-10 NOTE — Op Note (Signed)
 [facility] Gastroenterology Patient Name: Linda Henson Procedure Date: 12/10/2023 7:10 AM MRN: [id number] Account #: [id number] Date of Birth: [birth year] Admit Type: Outpatient Age: 68 Room: [facility] ENDO ROOM 1 Gender: Female Note Status: Finalized Instrument Name: Colon Scope [id number] Procedure:             Colonoscopy Indications:           Diarrhea, left lower quadrant pain Providers:             Elspeth Ozell Onita ROSALEA, DO Referring MD:          Luke Shade (Referring MD) Medicines:             Monitored Anesthesia Care Complications:         No immediate complications. Estimated blood loss:                         Minimal. Procedure:             Pre-Anesthesia Assessment:                        - Prior to the procedure, a History and Physical was                         performed, and patient medications and allergies were                         reviewed. The patient is competent. The risks and                         benefits of the procedure and the sedation options and                         risks were discussed with the patient. All questions                         were answered and informed consent was obtained.                         Patient identification and proposed procedure were                         verified by the physician, the nurse, the anesthetist                         and the technician in the endoscopy suite. Mental                         Status Examination: alert and oriented. Airway                         Examination: normal oropharyngeal airway and neck                         mobility. Respiratory Examination: clear to                         auscultation. CV Examination: RRR, no murmurs, no S3  or S4. Prophylactic Antibiotics: The patient does not                         require prophylactic antibiotics. Prior                         Anticoagulants: The patient has taken no anticoagulant                          or antiplatelet agents. ASA Grade Assessment: III - A                         patient with severe systemic disease. After reviewing                         the risks and benefits, the patient was deemed in                         satisfactory condition to undergo the procedure. The                         anesthesia plan was to use monitored anesthesia care                         (MAC). Immediately prior to administration of                         medications, the patient was re-assessed for adequacy                         to receive sedatives. The heart rate, respiratory                         rate, oxygen saturations, blood pressure, adequacy of                         pulmonary ventilation, and response to care were                         monitored throughout the procedure. The physical                         status of the patient was re-assessed after the                         procedure.                        After obtaining informed consent, the colonoscope was                         passed under direct vision. Throughout the procedure,                         the patient's blood pressure, pulse, and oxygen                         saturations were monitored continuously. The  Colonoscope was introduced through the anus and                         advanced to the the terminal ileum, with                         identification of the appendiceal orifice and IC                         valve. The colonoscopy was performed without                         difficulty. The patient tolerated the procedure well.                         The quality of the bowel preparation was evaluated                         using the BBPS [facility] Bowel Preparation Scale) with                         scores of: Right Colon = 3, Transverse Colon = 3 and                         Left Colon = 3 (entire mucosa seen well with no                         residual staining,  small fragments of stool or opaque                         liquid). The total BBPS score equals 9. The terminal                         ileum, ileocecal valve, appendiceal orifice, and                         rectum were photographed. Findings:      Hemorrhoids were found on perianal exam.      Skin tags were found on perianal exam.      The digital rectal exam was normal. Pertinent negatives include normal       sphincter tone.      The terminal ileum appeared normal. Biopsies were taken with a cold       forceps for histology. Estimated blood loss was minimal.      Retroflexion in the right colon was performed.      Normal mucosa was found in the entire colon. Biopsies for histology were       taken with a cold forceps from the right colon, left colon and       transverse colon for evaluation of microscopic colitis. Estimated blood       loss was minimal.      Multiple small-mouthed diverticula were found in the sigmoid colon.       Estimated blood loss: none.      Non-bleeding external and internal hemorrhoids were found during       retroflexion and during perianal exam. Estimated blood loss: none.      The exam was otherwise without abnormality on direct and  retroflexion       views. Impression:            - Hemorrhoids found on perianal exam.                        - Perianal skin tags found on perianal exam.                        - The examined portion of the ileum was normal.                         Biopsied.                        - Normal mucosa in the entire examined colon. Biopsied.                        - Diverticulosis in the sigmoid colon.                        - Non-bleeding external and internal hemorrhoids.                        - The examination was otherwise normal on direct and                         retroflexion views. Recommendation:        - Patient has a contact number available for                         emergencies. The signs and symptoms of potential                          delayed complications were discussed with the patient.                         Return to normal activities tomorrow. Written                         discharge instructions were provided to the patient.                        - Discharge patient to home.                        - Resume previous diet.                        - Continue present medications.                        - Await pathology results.                        - Repeat colonoscopy based on previous recommendations.                        - Return to referring physician as previously                         scheduled.                        -  The findings and recommendations were discussed with                         the patient. Procedure Code(s):     --- Professional ---                        [phone], Colonoscopy, flexible; with biopsy, single or                         multiple Diagnosis Code(s):     --- Professional ---                        K64.8, Other hemorrhoids                        K64.4, Residual hemorrhoidal skin tags                        K57.30, Diverticulosis of large intestine without                         perforation or abscess without bleeding CPT copyright 2022 American Medical Association. All rights reserved. The codes documented in this report are preliminary and upon coder review may  be revised to meet current compliance requirements. Attending Participation:      I personally performed the entire procedure. Elspeth Jungling, DO Elspeth Ozell Jungling DO, DO 12/10/2023 8:04:13 AM This report has been signed electronically. Number of Addenda: 0 Note Initiated On: 12/10/2023 7:10 AM Scope Withdrawal Time: 0 hours 8 minutes 47 seconds  Total Procedure Duration: 0 hours 12 minutes 38 seconds  Estimated Blood Loss:  Estimated blood loss was minimal.      [facility]

## 2023-12-10 NOTE — Anesthesia Preprocedure Evaluation (Signed)
 Anesthesia Evaluation  Patient identified by MRN, date of birth, ID band Patient awake    Reviewed: Allergy & Precautions, NPO status , Patient's Chart, lab work & pertinent test results  History of Anesthesia Complications Negative for: history of anesthetic complications  Airway Mallampati: III  TM Distance: <3 FB Neck ROM: full    Dental  (+) Chipped, Loose, Implants Reports a loose implant:   Pulmonary sleep apnea , COPD, former smoker   Pulmonary exam normal        Cardiovascular Exercise Tolerance: Good hypertension, Normal cardiovascular exam     Neuro/Psych negative neurological ROS     GI/Hepatic ,GERD  Controlled,,(+) Hepatitis -  Endo/Other  negative endocrine ROS    Renal/GU negative Renal ROS  negative genitourinary   Musculoskeletal   Abdominal   Peds  Hematology negative hematology ROS (+)   Anesthesia Other Findings Past Medical History: No date: Arthritis     Comment:  spine - mild 12/05/2020: Chronic dyspnea No date: Colon polyp     Comment:  Repeat colonoscopy 2018 No date: COPD (chronic obstructive pulmonary disease) (HCC) No date: Diverticulitis No date: GERD (gastroesophageal reflux disease)     Comment:  Resolved with wt loss 2018: Hepatitis C     Comment:  treated with Harvoni No date: History of gastric ulcer No date: Hyperlipidemia     Comment:  Borderline  No date: Hypertension 2014: Iron deficiency anemia 11/12/2014: LUQ pain 05/26/2022: Myalgia 05/30/2020: Pain and swelling of left lower leg No date: Sleep apnea     Comment:  CPAP  Past Surgical History: 1994: ABDOMINAL HYSTERECTOMY 08/30/2019: CATARACT EXTRACTION W/PHACO; Right     Comment:  Procedure: CATARACT EXTRACTION PHACO AND INTRAOCULAR               LENS PLACEMENT (IOC) RIGHT;  Surgeon: Jaye Fallow,               MD;  Location: [facility];  Service:               Ophthalmology;  Laterality:  Right;  2.16 0:20.2 08/19/2021: COLONOSCOPY; N/A     Comment:  Procedure: COLONOSCOPY;  Surgeon: Onita Elspeth Sharper,              DO;  Location: [facility] ENDOSCOPY;  Service:               Gastroenterology;  Laterality: N/A; 02/05/2015: COLONOSCOPY WITH PROPOFOL ; N/A     Comment:  Procedure: COLONOSCOPY WITH PROPOFOL ;  Surgeon: Donnice Vaughn Manes, MD;  Location: [facility] ENDOSCOPY;  Service:               Endoscopy;  Laterality: N/A; No date: EYE SURGERY 1994: SALPINGOOPHORECTOMY; Right  BMI    Body Mass Index: 33.45 kg/m      Reproductive/Obstetrics negative OB ROS                              Anesthesia Physical Anesthesia Plan  ASA: 3  Anesthesia Plan: General   Post-op Pain Management:    Induction: Intravenous  PONV Risk Score and Plan: Propofol  infusion and TIVA  Airway Management Planned: Natural Airway and Nasal Cannula  Additional Equipment:   Intra-op Plan:   Post-operative Plan:   Informed Consent: I have reviewed the patients History and Physical, chart, labs and discussed the procedure including the risks, benefits  and alternatives for the proposed anesthesia with the patient or authorized representative who has indicated his/her understanding and acceptance.     Dental Advisory Given  Plan Discussed with: Anesthesiologist, CRNA and Surgeon  Anesthesia Plan Comments: (Patient consented for risks of anesthesia including but not limited to:  - adverse reactions to medications - risk of airway placement if required - damage to eyes, teeth, lips or other oral mucosa - nerve damage due to positioning  - sore throat or hoarseness - Damage to heart, brain, nerves, lungs, other parts of body or loss of life  Patient voiced understanding and assent.)        Anesthesia Quick Evaluation

## 2023-12-10 NOTE — Interval H&P Note (Signed)
 History and Physical Interval Note: Preprocedure H&P from 12/10/23  was reviewed and there was no interval change after seeing and examining the patient.  Written consent was obtained from the patient after discussion of risks, benefits, and alternatives. Patient has consented to proceed with Colonoscopy with possible intervention   12/10/2023 7:37 AM  Linda Henson  has presented today for surgery, with the diagnosis of Diarrhea, unspecified type [R19.7]  Abdominal pain, LLQ (left lower quadrant) [R10.32].  The various methods of treatment have been discussed with the patient and family. After consideration of risks, benefits and other options for treatment, the patient has consented to  Procedure(s): COLONOSCOPY (N/A) as a surgical intervention.  The patient's history has been reviewed, patient examined, no change in status, stable for surgery.  I have reviewed the patient's chart and labs.  Questions were answered to the patient's satisfaction.     Elspeth Ozell Jungling

## 2023-12-10 NOTE — Transfer of Care (Signed)
 Immediate Anesthesia Transfer of Care Note  Patient: Linda Henson  Procedure(s) Performed: COLONOSCOPY  Patient Location: PACU and Endoscopy Unit  Anesthesia Type:General  Level of Consciousness: drowsy and patient cooperative  Airway & Oxygen Therapy: Patient Spontanous Breathing  Post-op Assessment: Report given to RN and Post -op Vital signs reviewed and stable  Post vital signs: Reviewed and stable  Last Vitals:  Vitals Value Taken Time  BP 127/75 12/10/23 08:04  Temp    Pulse 59 12/10/23 08:04  Resp 13 12/10/23 08:04  SpO2 97 % 12/10/23 08:04  Vitals shown include unfiled device data.  Last Pain:  Vitals:   12/10/23 0717  TempSrc: Temporal  PainSc: 0-No pain         Complications: No notable events documented.

## 2023-12-14 LAB — SURGICAL PATHOLOGY

## 2024-01-11 ENCOUNTER — Ambulatory Visit: Payer: Self-pay

## 2024-01-11 NOTE — Telephone Encounter (Signed)
 FYI Only or Action Required?: FYI only for provider.  Patient was last seen in primary care on 10/28/2023 by Abbey Bruckner, MD.  Called Nurse Triage reporting Hypertension.  Symptoms began a week ago.  Interventions attempted: Prescription medications: valsartan .  Symptoms are: unchanged.  Triage Disposition: See Physician Within 24 Hours  Patient/caregiver understands and will follow disposition?: Yes   Copied from CRM #[id number]. Topic: Clinical - Red Word Triage >> Jan 11, 2024  3:53 PM Viola F wrote: Red Word that prompted transfer to Nurse Triage: Patient blood pressure 180/100 - requested appt for today or tomorrow Reason for Disposition  Systolic BP >= 180 OR Diastolic >= 110  Answer Assessment - Initial Assessment Questions Patient states that she started to check her BP again last week and it was elevated. States this am it was 190's, while on phone with RN 190's   1. BLOOD PRESSURE: What is your blood pressure? Did you take at least two measurements 5 minutes apart?     190 this am 2. ONSET: When did you take your blood pressure?     This am and then again on the phone with RN 3. HOW: How did you take your blood pressure? (e.g., automatic home BP monitor, visiting nurse)     Automatic cuff 4. HISTORY: Do you have a history of high blood pressure?     no 5. MEDICINES: Are you taking any medicines for blood pressure? Have you missed any doses recently?     Valsartan   6. OTHER SYMPTOMS: Do you have any symptoms? (e.g., blurred vision, chest pain, difficulty breathing, headache, weakness)    States vision has been slightly blurry but thinks it's dry eyes and no symptoms currently.  Protocols used: Blood Pressure - High-A-AH

## 2024-01-12 ENCOUNTER — Ambulatory Visit (INDEPENDENT_AMBULATORY_CARE_PROVIDER_SITE_OTHER)

## 2024-01-12 VITALS — BP 165/84 | HR 49 | Temp 98.4°F | Ht 65.0 in | Wt 208.8 lb

## 2024-01-12 DIAGNOSIS — I10 Essential (primary) hypertension: Secondary | ICD-10-CM

## 2024-01-12 DIAGNOSIS — G4733 Obstructive sleep apnea (adult) (pediatric): Secondary | ICD-10-CM

## 2024-01-12 MED ORDER — VALSARTAN 320 MG PO TABS: 320.0000 mg | ORAL_TABLET | Freq: Every day | ORAL | 3 refills | Status: AC

## 2024-01-12 NOTE — Progress Notes (Signed)
 Acute Office Visit  Subjective:    Patient ID: Linda Henson, female    DOB: October 02, 1955, 68 y.o.   MRN: [id number]  Chief Complaint  Patient presents with   Hypertension    Patient is in today for following acute concerns:  HPI Discussed the use of AI scribe software for clinical note transcription with the patient, who gave verbal consent to proceed.  History of Present Illness Linda Henson is a 68 year old female with hypertension who presents with elevated blood pressure readings at home.  She has been monitoring her blood pressure at home using a new device, noting several elevated readings over the past week or two, including a measurement of 193/100 mmHg. Her heart rate has been low. She was evaluated by a cardiologist earlier this year, who found no issues after she wore a heart monitor. She has no dizziness, lightheadedness, or chest pain.  She has a history of significant weight fluctuation, having lost 20 pounds at the end of last year but subsequently regaining it. She attributes some of her stress to her daughter's previous struggles with homelessness and drug use, although her daughter is now doing well and has been sober for three months.   She has reduced her alcohol intake. She takes valsartan  160 mg for hypertension at around 10 AM daily, though sometimes as late as noon. She uses a CPAP machine for sleep apnea consistently and reports sleeping well most nights, though occasionally disturbed by her dogs.  Her social history includes being retired and spending much of her time watching television, which she finds frustrating due to current events. She engages in physical activity by walking her dogs and playing golf. She is considering resuming exercise and dietary changes to address her weight.  She takes trazodone  infrequently, about once a month, and reports no changes in her other medications.  Also underwent colonoscopy for diarrhea. Colonoscopy  reassuring and has noted improvement since her last visit with me. She saw Dr. Onita at [facility] for this.     ROS As per HPI    Objective:    BP (!) 165/84 (BP Location: Right Arm, Patient Position: Sitting, Cuff Size: Normal)   Pulse (!) 49   Temp 98.4 F (36.9 C) (Oral)   Ht 5' 5 (1.651 m)   Wt 208 lb 12.8 oz (94.7 kg)   SpO2 97%   BMI 34.75 kg/m    Physical Exam Constitutional:      General: She is not in acute distress.    Appearance: She is obese.  HENT:     Head: Normocephalic and atraumatic.     Mouth/Throat:     Mouth: Mucous membranes are moist.  Cardiovascular:     Rate and Rhythm: Regular rhythm. Bradycardia present.     Heart sounds: No murmur heard. Pulmonary:     Effort: Pulmonary effort is normal.     Breath sounds: Normal breath sounds.  Abdominal:     General: Bowel sounds are normal.     Palpations: Abdomen is soft.     Tenderness: There is no guarding.  Musculoskeletal:     Cervical back: Neck supple. No rigidity.     Right lower leg: No edema.     Left lower leg: No edema.  Lymphadenopathy:     Cervical: No cervical adenopathy.  Neurological:     Mental Status: She is alert and oriented to person, place, and time.  Psychiatric:        Mood  and Affect: Mood normal.     No results found for any visits on 01/12/24.     Assessment & Plan:   Primary hypertension Assessment & Plan: Chronic with BP above goal (<130/80 mmHg). Increase Valsartan  from 160 mg daily to 320 mg daily, take it at the same time.  Continue home blood pressure monitoring and bring cuff to appointments. Advise on proper blood pressure measurement technique. Encourage alcohol reduction to no more than one drink per sitting and seven per week. Advise on weight loss and dietary modifications, including high potassium and low sodium diet. Schedule follow-up in two months for blood pressure check and kidney function lab work. Instruct to report dizziness, fatigue, or heart  rate below 50 bpm.    Orders: -     Valsartan ; Take 1 tablet (320 mg total) by mouth daily.  Dispense: 90 tablet; Refill: 3 -     Comprehensive metabolic panel with GFR; Future  OSA on CPAP Assessment & Plan: Chronic and compliant with CPAP.      Return in about 8 weeks (around 03/08/2024) for 2 months for BP check .  Luke Shade, MD

## 2024-01-12 NOTE — Assessment & Plan Note (Signed)
 Chronic with BP above goal (<130/80 mmHg). Increase Valsartan  from 160 mg daily to 320 mg daily, take it at the same time.  Continue home blood pressure monitoring and bring cuff to appointments. Advise on proper blood pressure measurement technique. Encourage alcohol reduction to no more than one drink per sitting and seven per week. Advise on weight loss and dietary modifications, including high potassium and low sodium diet. Schedule follow-up in two months for blood pressure check and kidney function lab work. Instruct to report dizziness, fatigue, or heart rate below 50 bpm.

## 2024-01-12 NOTE — Assessment & Plan Note (Signed)
 Chronic and compliant with CPAP.

## 2024-02-05 ENCOUNTER — Other Ambulatory Visit: Payer: Self-pay

## 2024-02-05 DIAGNOSIS — G4709 Other insomnia: Secondary | ICD-10-CM

## 2024-02-08 ENCOUNTER — Ambulatory Visit: Admitting: Dermatology

## 2024-02-08 ENCOUNTER — Encounter: Payer: Self-pay | Admitting: Dermatology

## 2024-02-08 DIAGNOSIS — D2372 Other benign neoplasm of skin of left lower limb, including hip: Secondary | ICD-10-CM | POA: Diagnosis not present

## 2024-02-08 DIAGNOSIS — L814 Other melanin hyperpigmentation: Secondary | ICD-10-CM

## 2024-02-08 DIAGNOSIS — L900 Lichen sclerosus et atrophicus: Secondary | ICD-10-CM | POA: Diagnosis not present

## 2024-02-08 DIAGNOSIS — W908XXA Exposure to other nonionizing radiation, initial encounter: Secondary | ICD-10-CM | POA: Diagnosis not present

## 2024-02-08 DIAGNOSIS — L821 Other seborrheic keratosis: Secondary | ICD-10-CM | POA: Diagnosis not present

## 2024-02-08 DIAGNOSIS — D229 Melanocytic nevi, unspecified: Secondary | ICD-10-CM

## 2024-02-08 DIAGNOSIS — Z79899 Other long term (current) drug therapy: Secondary | ICD-10-CM

## 2024-02-08 DIAGNOSIS — Z1283 Encounter for screening for malignant neoplasm of skin: Secondary | ICD-10-CM

## 2024-02-08 DIAGNOSIS — Z808 Family history of malignant neoplasm of other organs or systems: Secondary | ICD-10-CM

## 2024-02-08 DIAGNOSIS — L922 Granuloma faciale [eosinophilic granuloma of skin]: Secondary | ICD-10-CM | POA: Diagnosis not present

## 2024-02-08 DIAGNOSIS — D1801 Hemangioma of skin and subcutaneous tissue: Secondary | ICD-10-CM

## 2024-02-08 DIAGNOSIS — L853 Xerosis cutis: Secondary | ICD-10-CM | POA: Diagnosis not present

## 2024-02-08 DIAGNOSIS — L918 Other hypertrophic disorders of the skin: Secondary | ICD-10-CM

## 2024-02-08 DIAGNOSIS — L578 Other skin changes due to chronic exposure to nonionizing radiation: Secondary | ICD-10-CM | POA: Diagnosis not present

## 2024-02-08 DIAGNOSIS — D239 Other benign neoplasm of skin, unspecified: Secondary | ICD-10-CM

## 2024-02-08 DIAGNOSIS — D492 Neoplasm of unspecified behavior of bone, soft tissue, and skin: Secondary | ICD-10-CM | POA: Diagnosis not present

## 2024-02-08 DIAGNOSIS — D2361 Other benign neoplasm of skin of right upper limb, including shoulder: Secondary | ICD-10-CM | POA: Diagnosis not present

## 2024-02-08 DIAGNOSIS — Z872 Personal history of diseases of the skin and subcutaneous tissue: Secondary | ICD-10-CM

## 2024-02-08 NOTE — Progress Notes (Signed)
 Follow-Up Visit   Subjective  Linda Henson is a 68 y.o. female who presents for the following: Skin Cancer Screening and Full Body Skin Exam, hx of AKs LS&A groin, Clobetasol  oint qd past 2 weeks, wasn't using regularly beforehand since didn't have symptoms.  The patient presents for Total-Body Skin Exam (TBSE) for skin cancer screening and mole check. The patient has spots, moles and lesions to be evaluated, some may be new or changing and the patient may have concern these could be cancer.    The following portions of the chart were reviewed this encounter and updated as appropriate: medications, allergies, medical history  Review of Systems:  No other skin or systemic complaints except as noted in HPI or Assessment and Plan.  Objective  Well appearing patient in no apparent distress; mood and affect are within normal limits.  A full examination was performed including scalp, head, eyes, ears, nose, lips, neck, chest, axillae, abdomen, back, buttocks, bilateral upper extremities, bilateral lower extremities, hands, feet, fingers, toes, fingernails, and toenails. All findings within normal limits unless otherwise noted below.   Relevant physical exam findings are noted in the Assessment and Plan.  R lower cheek above jaw 0.6cm firm pink flesh papule, new to patient   Assessment & Plan   SKIN CANCER SCREENING PERFORMED TODAY.  ACTINIC DAMAGE - Chronic condition, secondary to cumulative UV/sun exposure - diffuse scaly erythematous macules with underlying dyspigmentation - Recommend daily broad spectrum sunscreen SPF 30+ to sun-exposed areas, reapply every 2 hours as needed.  - Staying in the shade or wearing long sleeves, sun glasses (UVA+UVB protection) and wide brim hats (4-inch brim around the entire circumference of the hat) are also recommended for sun protection.  - Call for new or changing lesions.  LENTIGINES, SEBORRHEIC KERATOSES, HEMANGIOMAS - Benign normal skin  lesions - SK L forehead- waxy flesh papule - Benign-appearing - Call for any changes  MELANOCYTIC NEVI - Tan-brown and/or pink-flesh-colored symmetric macules and papules - Benign appearing on exam today - Observation - Call clinic for new or changing moles - Recommend daily use of broad spectrum spf 30+ sunscreen to sun-exposed areas.   LICHEN SCLEROSUS ET ATROPHICUS Labia Exam: mild erythema L labia minora with hypo/hyperpigmentation with complete agglutination at labia minora  Chronic and persistent condition with duration or expected duration over one year. Condition is improving with treatment but not currently at goal.  Lichen sclerosus is a chronic inflammatory condition of unknown cause that frequently involves the vaginal area and less commonly extragenital skin, and is NOT sexually transmitted. It frequently causes symptoms of pain and burning.  It requires regular monitoring and treatment with topical steroids to minimize inflammation and to reduce risk of scarring. There is also a risk of cancer in the vaginal area which is very low if inflammation is well controlled. Regular checks of the area are recommended. Please call if you notice any new or changing spots within this area.  Treatment Plan: Cont clobetasol  oint 2x/wk for maintenance, then qd/bid up to 2 wks prn flares  Topical steroids (such as triamcinolone , fluocinolone, fluocinonide, mometasone, clobetasol , halobetasol, betamethasone , hydrocortisone) can cause thinning and lightening of the skin if they are used for too long in the same area. Your physician has selected the right strength medicine for your problem and area affected on the body. Please use your medication only as directed by your physician to prevent side effects.   Long term medication management.  Patient is using long term (months  to years) prescription medication  to control their dermatologic condition.  These medications require periodic monitoring  to evaluate for efficacy and side effects and may require periodic laboratory monitoring.  HISTORY OF PRECANCEROUS ACTINIC KERATOSIS - site(s) of PreCancerous Actinic Keratosis clear today. - these may recur and new lesions may form requiring treatment to prevent transformation into skin cancer - observe for new or changing spots and contact [facility] for appointment if occur - photoprotection with sun protective clothing; sunglasses and broad spectrum sunscreen with SPF of at least 30 + and frequent self skin exams recommended - yearly exams by a dermatologist recommended for persons with history of PreCancerous Actinic Keratoses   FAMILY HISTORY OF SKIN CANCER What type(s): not sure type Who affected: mother   Acrochordons (Skin Tags) Neck, axilla - Fleshy, skin-colored pedunculated papules - Benign appearing.  - Observe. - If desired, they can be removed with an in office procedure that is not covered by insurance. - Please call the clinic if you notice any new or changing lesions.   Xerosis Elbows - diffuse xerotic patches - recommend gentle, hydrating skin care - gentle skin care handout given  DERMATOFIBROMA L lower leg, R upper arm Exam: Firm pink/brown papulenodules with dimple sign L lower leg, R upper arm  Treatment Plan: A dermatofibroma is a benign growth possibly related to trauma, such as an insect bite, cut from shaving, or inflamed acne-type bump.  Treatment options to remove include shave or excision with resulting scar and risk of recurrence.  Since benign-appearing and not bothersome, will observe for now.   NEOPLASM OF SKIN R lower cheek above jaw Epidermal / dermal shaving  Lesion diameter (cm):  0.6 Informed consent: discussed and consent obtained   Patient was prepped and draped in usual sterile fashion: area prepped with alcohol. Anesthesia: the lesion was anesthetized in a standard fashion   Anesthetic:  1% lidocaine  w/ epinephrine   1-100,000 buffered w/ 8.4% NaHCO3 Instrument used: flexible razor blade   Hemostasis achieved with: pressure, aluminum chloride and electrodesiccation   Outcome: patient tolerated procedure well   Post-procedure details: wound care instructions given   Post-procedure details comment:  Ointment and small bandage applied  Specimen 1 - Surgical pathology Differential Diagnosis: Nevus r/o BCC  Check Margins: yes 0.6cm firm pink flesh pap Return in about 1 year (around 02/07/2025) for TBSE, Hx of AKs.  I, Grayce Saunas, RMA, am acting as scribe for Rexene Rattler, MD .   Documentation: I have reviewed the above documentation for accuracy and completeness, and I agree with the above.  Rexene Rattler, MD

## 2024-02-08 NOTE — Patient Instructions (Addendum)

## 2024-02-09 ENCOUNTER — Telehealth: Payer: Self-pay | Admitting: Cardiology

## 2024-02-09 ENCOUNTER — Ambulatory Visit: Payer: Self-pay | Admitting: Dermatology

## 2024-02-09 LAB — SURGICAL PATHOLOGY

## 2024-02-09 NOTE — Telephone Encounter (Signed)
 Pt c/o BP issue: STAT if pt c/o blurred vision, one-sided weakness or slurred speech.  STAT if BP is GREATER than 180/120 TODAY.  STAT if BP is LESS than 90/60 and SYMPTOMATIC TODAY  1. What is your BP concern? BP is running high  2. Have you taken any BP medication today?Yes, and recently had dosage increased  3. What are your last 5 BP readings? 180/110 from this past few days. Patient has not taken her BP readings for today.  4. Are you having any other symptoms (ex. Dizziness, headache, blurred vision, passed out)? No, feels her heart flutter. Heart rate will get low when her BP is high.

## 2024-02-09 NOTE — Telephone Encounter (Signed)
 Called and spoke with patient. Patient reports persistently elevated blood pressure readings. States her PCP increased Valsartan  from 160 mg to 320 mg approximately one month ago. She has noticed some improvement since the dose increase, but blood pressure remains elevated at times.  Today's reported blood pressure is 150/86. She also reports recent readings of 190/113 and 180/110. Patient checks her blood pressure at various times throughout the day, and some readings were taken prior to medication administration.  Patient notes that her heart rate occasionally drops into the 50s during episodes of elevated blood pressure.  Patient is scheduled for clinic follow-up on 02/10/24.

## 2024-02-10 ENCOUNTER — Encounter: Payer: Self-pay | Admitting: Cardiology

## 2024-02-10 ENCOUNTER — Ambulatory Visit: Attending: Cardiology | Admitting: Cardiology

## 2024-02-10 VITALS — BP 118/62 | HR 60 | Ht 65.0 in | Wt 205.8 lb

## 2024-02-10 DIAGNOSIS — R001 Bradycardia, unspecified: Secondary | ICD-10-CM

## 2024-02-10 DIAGNOSIS — I10 Essential (primary) hypertension: Secondary | ICD-10-CM

## 2024-02-10 NOTE — Patient Instructions (Signed)
 Medication Instructions:  Your physician recommends that you continue on your current medications as directed. Please refer to the Current Medication list given to you today.   *If you need a refill on your cardiac medications before your next appointment, please call your pharmacy*  Lab Work: No labs ordered today  If you have labs (blood work) drawn today and your tests are completely normal, you will receive your results only by: MyChart Message (if you have MyChart) OR A paper copy in the mail If you have any lab test that is abnormal or we need to change your treatment, we will call you to review the results.  Testing/Procedures: No test ordered today   Follow-Up: At [facility], you and your health needs are our priority.  As part of our continuing mission to provide you with exceptional heart care, our providers are all part of one team.  This team includes your primary Cardiologist (physician) and Advanced Practice Providers or APPs (Physician Assistants and Nurse Practitioners) who all work together to provide you with the care you need, when you need it.  Your next appointment:   8 week(s)  Provider:   You may see Dr Darliss or one of the following Advanced Practice Providers on your designated Care Team:   Lonni Meager, NP Lesley Maffucci, PA-C Bernardino Bring, PA-C Cadence Anita, PA-C Tylene Lunch, NP Barnie Hila, NP    We recommend signing up for the patient portal called MyChart.  Sign up information is provided on this After Visit Summary.  MyChart is used to connect with patients for Virtual Visits (Telemedicine).  Patients are able to view lab/test results, encounter notes, upcoming appointments, etc.  Non-urgent messages can be sent to your provider as well.   To learn more about what you can do with MyChart, go to [email or web address].

## 2024-02-10 NOTE — Progress Notes (Signed)
 Cardiology Office Note:    Date:  02/10/2024   ID:  Linda Henson, DOB [birth year], MRN [id number]  PCP:  Linda Bruckner, MD   [facility] Providers Cardiologist:  None     Referring MD: Linda Bruckner, MD   Chief Complaint  Patient presents with   Follow-up    HYPERTENSION follow up pat has been doing well with no complaints of chest pain, chest pressure or SOB, medciation reviewed verbally with patient    History of Present Illness:    Linda Henson is a 68 y.o. female with a hx of hypertension, former smoker x 7 years, OSA on CPAP, asymptomatic sinus bradycardia, presenting for follow-up.   Doing okay, denies chest pain or shortness of breath.  BP has been elevated of late, valsartan  increased to 320 mg daily about 3 to 4 weeks ago by primary care physician.  Tolerating medication as prescribed, systolics still ranges in the 150s at home.  Has improved her salt intake, cut back on processed/salty foods.  Prior notes Echo 10/22 EF 60 to 65%  Past Medical History:  Diagnosis Date   Actinic keratosis    Arthritis    spine - mild   Chronic dyspnea 12/05/2020   Colon polyp    Repeat colonoscopy 2018   COPD (chronic obstructive pulmonary disease) (HCC)    Diverticulitis    GERD (gastroesophageal reflux disease)    Resolved with wt loss   Hepatitis C 2018   treated with Harvoni   History of gastric ulcer    Hyperlipidemia    Borderline    Hypertension    Iron deficiency anemia 2014   LUQ pain 11/12/2014   Myalgia 05/26/2022   Pain and swelling of left lower leg 05/30/2020   Sleep apnea    CPAP   Wheezing 08/28/2023    Past Surgical History:  Procedure Laterality Date   ABDOMINAL HYSTERECTOMY  1994   CATARACT EXTRACTION W/PHACO Right 08/30/2019   Procedure: CATARACT EXTRACTION PHACO AND INTRAOCULAR LENS PLACEMENT (IOC) RIGHT;  Surgeon: Jaye Fallow, MD;  Location: [facility];  Service: Ophthalmology;  Laterality: Right;  2.16 0:20.2    COLONOSCOPY N/A 08/19/2021   Procedure: COLONOSCOPY;  Surgeon: Onita Elspeth Sharper, DO;  Location: [facility] ENDOSCOPY;  Service: Gastroenterology;  Laterality: N/A;   COLONOSCOPY N/A 12/10/2023   Procedure: COLONOSCOPY;  Surgeon: Onita Elspeth Sharper, DO;  Location: [facility] ENDOSCOPY;  Service: Gastroenterology;  Laterality: N/A;   COLONOSCOPY WITH PROPOFOL  N/A 02/05/2015   Procedure: COLONOSCOPY WITH PROPOFOL ;  Surgeon: Donnice Vaughn Manes, MD;  Location: [facility] ENDOSCOPY;  Service: Endoscopy;  Laterality: N/A;   EYE SURGERY     SALPINGOOPHORECTOMY Right 1994    Current Medications: Current Meds  Medication Sig   Calcium  Carb-Cholecalciferol (CALCIUM  500 + D PO) Take 1 tablet by mouth daily.   Cholecalciferol (VITAMIN D3) 50 MCG (2000 UT) CAPS Take 1 capsule by mouth daily.   clobetasol  ointment (TEMOVATE ) 0.05 % Apply 1 Application topically 2 (two) times daily. Apply to aa's BID PRN flares.   Cyanocobalamin  (B-12) 100 MCG TABS Take 1 tablet by mouth daily.   fexofenadine  (ALLEGRA  ALLERGY) 180 MG tablet Take 1 tablet (180 mg total) by mouth daily as needed for allergies or rhinitis.   fluticasone  (FLONASE ) 50 MCG/ACT nasal spray SPRAY 2 SPRAYS INTO EACH NOSTRIL EVERY DAY   pantoprazole  (PROTONIX ) 40 MG tablet Take 1 tablet (40 mg total) by mouth daily.   pravastatin  (PRAVACHOL ) 40 MG tablet Take 1 tablet (40 mg  total) by mouth daily.   traZODone  (DESYREL ) 50 MG tablet TAKE 1/2 TO 1 TABLET(25 TO 50 MG) BY MOUTH AT BEDTIME AS NEEDED FOR SLEEP   valsartan  (DIOVAN ) 320 MG tablet Take 1 tablet (320 mg total) by mouth daily.   Vitamin D , Ergocalciferol , (DRISDOL ) 1.25 MG (50000 UNIT) CAPS capsule Take 1 capsule (50,000 Units total) by mouth every 7 (seven) days.     Allergies:   Patient has no known allergies.   Social History   Socioeconomic History   Marital status: Widowed    Spouse name: Not on file   Number of children: 1   Years of education: 14   Highest education level: Some college,  no degree  Occupational History   Occupation: Dedicated Museum/gallery Conservator: [employer]    Comment: [employer]  Tobacco Use   Smoking status: Former    Current packs/day: 0.00    Types: Cigarettes    Start date: 12/15/1988    Quit date: 12/15/1992    Years since quitting: 31.1   Smokeless tobacco: Never   Tobacco comments:    Smoked a pack per week.  Vaping Use   Vaping status: Never Used  Substance and Sexual Activity   Alcohol use: Yes    Alcohol/week: 14.0 standard drinks of alcohol    Types: 14 Standard drinks or equivalent per week    Comment: burbon   Drug use: No   Sexual activity: Not Currently    Birth control/protection: Surgical  Other Topics Concern   Not on file  Social History Narrative   Linda Henson grew up in [place], [place]. She is widowed for 5 years. She lives at home with her 15 year old mother. 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 She enjoys playing golf.    Social Drivers of Corporate Investment Banker Strain: Low Risk  (01/11/2024)   Overall Financial Resource Strain (CARDIA)    Difficulty of Paying Living Expenses: Not hard at all  Food Insecurity: No Food Insecurity (01/11/2024)   Hunger Vital Sign    Worried About Running Out of Food in the Last Year: Never true    Ran Out of Food in the Last Year: Never true  Transportation Needs: No Transportation Needs (01/11/2024)   PRAPARE - Administrator, Civil Service (Medical): No    Lack of Transportation (Non-Medical): No  Physical Activity: Sufficiently Active (01/11/2024)   Exercise Vital Sign    Days of Exercise per Week: 5 days    Minutes of Exercise per Session: 30 min  Stress: No Stress Concern Present (01/11/2024)   [employer] of Occupational Health - Occupational Stress Questionnaire    Feeling of Stress: Not at all  Social Connections: Socially Isolated (01/11/2024)   Social Connection and Isolation Panel    Frequency of  Communication with Friends and Family: More than three times a week    Frequency of Social Gatherings with Friends and Family: Once a week    Attends Religious Services: Never    Database Administrator or Organizations: No    Attends Engineer, Structural: Not on file    Marital Status: Widowed     Family History: The patient's family history includes Cancer (age of onset: 71) in her brother; Colon polyps in her mother; Diabetes in her paternal grandmother; Diverticulitis in her mother; Heart disease (age of onset: 49) in her father; Hyperlipidemia in her father; Hypothyroidism  in her sister; Liver cancer in her brother. There is no history of Breast cancer.  ROS:   Please see the history of present illness.     All other systems reviewed and are negative.  EKGs/Labs/Other Studies Reviewed:    The following studies were reviewed today:  EKG Interpretation Date/Time:  Wednesday February 10 2024 10:50:37 EST Ventricular Rate:  60 PR Interval:  138 QRS Duration:  90 QT Interval:  416 QTC Calculation: 416 R Axis:   9  Text Interpretation: Normal sinus rhythm Cannot rule out Anterior infarct , age undetermined Confirmed by Darliss Rogue ([id number]) on 02/10/2024 11:01:07 AM    Recent Labs: 10/28/2023: ALT 14; BUN 15; Creatinine, Ser 0.86; Hemoglobin 14.3; Magnesium 1.7; Platelets 185.0; Potassium 4.1; Sodium 139; TSH 1.75  Recent Lipid Panel    Component Value Date/Time   CHOL 157 06/15/2023 0751   TRIG 176.0 (H) 06/15/2023 0751   HDL 44.00 06/15/2023 0751   CHOLHDL 4 06/15/2023 0751   VLDL 35.2 06/15/2023 0751   LDLCALC 78 06/15/2023 0751   LDLDIRECT 135.0 05/01/2023 0730     Risk Assessment/Calculations:             Physical Exam:    VS:  BP 118/62 (BP Location: Left Arm, Patient Position: Sitting)   Pulse 60   Ht 5' 5 (1.651 m)   Wt 205 lb 12.8 oz (93.4 kg)   SpO2 96%   BMI 34.25 kg/m     Wt Readings from Last 3 Encounters:  02/10/24 205 lb 12.8 oz  (93.4 kg)  01/12/24 208 lb 12.8 oz (94.7 kg)  12/10/23 201 lb (91.2 kg)     GEN:  Well nourished, well developed in no acute distress HEENT: Normal NECK: No JVD; No carotid bruits CARDIAC: RRR, 1/6 systolic murmur loudest at right sternal border RESPIRATORY:  Clear to auscultation without rales, wheezing or rhonchi  ABDOMEN: Soft, non-tender, distended MUSCULOSKELETAL:  No edema; No deformity  SKIN: Warm and dry NEUROLOGIC:  Alert and oriented x 3 PSYCHIATRIC:  Normal affect   ASSESSMENT:    1. Bradycardia   2. Primary hypertension    PLAN:    In order of problems listed above:  History of bradycardia, asymptomatic.  Previous cardiac monitor with no heart block or pauses.  Clinically asymptomatic, no indication for pacemaker. Hypertension, BP controlled today, apparently increased at home..  Continue valsartan  320 mg daily.  Check BP at home and keep a log, bring BP machine at follow-up visit.  Follow-up in 8 weeks for BP check.      Medication Adjustments/Labs and Tests Ordered: Current medicines are reviewed at length with the patient today.  Concerns regarding medicines are outlined above.  Orders Placed This Encounter  Procedures   EKG 12-Lead   No orders of the defined types were placed in this encounter.   Patient Instructions  Medication Instructions:  Your physician recommends that you continue on your current medications as directed. Please refer to the Current Medication list given to you today.   *If you need a refill on your cardiac medications before your next appointment, please call your pharmacy*  Lab Work: No labs ordered today  If you have labs (blood work) drawn today and your tests are completely normal, you will receive your results only by: MyChart Message (if you have MyChart) OR A paper copy in the mail If you have any lab test that is abnormal or we need to change your treatment, we will call you to  review the  results.  Testing/Procedures: No test ordered today   Follow-Up: At [facility], you and your health needs are our priority.  As part of our continuing mission to provide you with exceptional heart care, our providers are all part of one team.  This team includes your primary Cardiologist (physician) and Advanced Practice Providers or APPs (Physician Assistants and Nurse Practitioners) who all work together to provide you with the care you need, when you need it.  Your next appointment:   8 week(s)  Provider:   You may see Dr Darliss or one of the following Advanced Practice Providers on your designated Care Team:   Lonni Meager, NP Lesley Maffucci, PA-C Bernardino Bring, PA-C Cadence [place], PA-C Tylene Lunch, NP Barnie Hila, NP    We recommend signing up for the patient portal called MyChart.  Sign up information is provided on this After Visit Summary.  MyChart is used to connect with patients for Virtual Visits (Telemedicine).  Patients are able to view lab/test results, encounter notes, upcoming appointments, etc.  Non-urgent messages can be sent to your provider as well.   To learn more about what you can do with MyChart, go to [email or web address].             Signed, Redell Darliss, MD  02/10/2024 12:22 PM    [facility]

## 2024-02-15 NOTE — Telephone Encounter (Signed)
 Advised patient biopsy of the right lower cheek above jaw was benign. No further treatment needed at this time.

## 2024-02-15 NOTE — Telephone Encounter (Signed)
-----   Message from Rexene Rattler sent at 02/09/2024  6:11 PM EST ----- 1. Skin, right lower cheek above jaw :       GRANULOMA FACIALE  Rare, benign lesion of unknown cause (not a skin cancer), generally it is a solitary lesion.  If it recurs, could treat with topical or intralesional steroids.  No further treatment needed at this  time  - please call patient ----- Message ----- From: Interface, Lab In Three Zero Seven Sent: 02/09/2024   4:45 PM EST To: Rexene Rattler, MD

## 2024-03-07 ENCOUNTER — Ambulatory Visit

## 2024-03-14 ENCOUNTER — Other Ambulatory Visit: Payer: Self-pay

## 2024-03-14 DIAGNOSIS — Z1231 Encounter for screening mammogram for malignant neoplasm of breast: Secondary | ICD-10-CM

## 2024-03-29 ENCOUNTER — Ambulatory Visit

## 2024-04-05 ENCOUNTER — Ambulatory Visit (INDEPENDENT_AMBULATORY_CARE_PROVIDER_SITE_OTHER)

## 2024-04-05 ENCOUNTER — Ambulatory Visit
Admission: RE | Admit: 2024-04-05 | Discharge: 2024-04-05 | Disposition: A | Discharge status: Home / Self Care | Attending: Emergency Medicine | Admitting: Emergency Medicine

## 2024-04-05 ENCOUNTER — Ambulatory Visit (HOSPITAL_COMMUNITY): Payer: Self-pay

## 2024-04-05 VITALS — BP 141/81 | HR 85 | Temp 98.4°F | Resp 18

## 2024-04-05 DIAGNOSIS — J101 Influenza due to other identified influenza virus with other respiratory manifestations: Secondary | ICD-10-CM

## 2024-04-05 DIAGNOSIS — I10 Essential (primary) hypertension: Secondary | ICD-10-CM

## 2024-04-05 DIAGNOSIS — R042 Hemoptysis: Secondary | ICD-10-CM

## 2024-04-05 DIAGNOSIS — R0602 Shortness of breath: Secondary | ICD-10-CM

## 2024-04-05 DIAGNOSIS — J449 Chronic obstructive pulmonary disease, unspecified: Secondary | ICD-10-CM

## 2024-04-05 MED ORDER — ALBUTEROL SULFATE HFA 108 (90 BASE) MCG/ACT IN AERS: 1.0000 | INHALATION_SPRAY | Freq: Four times a day (QID) | RESPIRATORY_TRACT | 0 refills | Status: AC | PRN

## 2024-04-05 MED ORDER — OSELTAMIVIR PHOSPHATE 75 MG PO CAPS: 75.0000 mg | ORAL_CAPSULE | Freq: Two times a day (BID) | ORAL | 0 refills | Status: AC

## 2024-04-05 NOTE — Discharge Instructions (Addendum)
 Follow up with your primary care provider tomorrow.  Go to the emergency department if you have worsening symptoms.    Your chest x-ray is pending.  I will call you with the result.    Take the Tamiflu  as directed for influenza.  Use the albuterol  inhaler as directed for COPD.    Your blood pressure is elevated today at 162/94; recheck 141/81.  Please have this rechecked by your primary care provider in 2-4 weeks.

## 2024-04-05 NOTE — ED Provider Notes (Signed)
 " [name]    CSN: [id number] Arrival date & time: 04/05/24  1014      History   Chief Complaint Chief Complaint  Patient presents with   Cough    Can not breath. Seeing blood in phlegm - Entered by patient   Shortness of Breath    HPI Linda Henson is a 69 y.o. female.  Patient presents with 1 day history of cough and shortness of breath.  She had shortness of breath when she was lying down last night; this resolved with sitting up.  She reports blood-tinged sputum when coughing this morning.  No fever or chest pain.  She reports positive flu test at home today.  She took Mucinex  and codeine  cough syrup for her symptoms.  Her medical history includes COPD, emphysema, hypertension, hyperlipidemia.  The history is provided by the patient and medical records.    Past Medical History:  Diagnosis Date   Actinic keratosis    Arthritis    spine - mild   Chronic dyspnea 12/05/2020   Colon polyp    Repeat colonoscopy 2018   COPD (chronic obstructive pulmonary disease) (HCC)    Diverticulitis    GERD (gastroesophageal reflux disease)    Resolved with wt loss   Hepatitis C 2018   treated with Harvoni   History of gastric ulcer    Hyperlipidemia    Borderline    Hypertension    Iron deficiency anemia 2014   LUQ pain 11/12/2014   Myalgia 05/26/2022   Pain and swelling of left lower leg 05/30/2020   Sleep apnea    CPAP   Wheezing 08/28/2023    Patient Active Problem List   Diagnosis Date Noted   Other insomnia 10/28/2023   Allergic rhinitis 08/28/2023   Vitamin D  deficiency 06/10/2023   Abnormal glucose 06/10/2023   Bradycardia 09/29/2022   Neuropathy 05/26/2022   Alcohol use 02/17/2022   It band syndrome, right 02/14/2021   Aortic atherosclerosis 12/05/2020   Hypertension 05/30/2020   Primary osteoarthritis involving multiple joints 05/09/2019   History of hepatitis C 04/12/2019   Polyarthralgia 04/12/2019   Emphysema lung (HCC) 03/28/2019    Lichen sclerosus et atrophicus 03/05/2018   Anal skin tag 02/18/2018   Internal hemorrhoid 02/18/2018   Labial lesion 01/29/2018   SI (sacroiliac) joint dysfunction 01/29/2018   Mixed incontinence urge and stress 07/24/2017   Mood disorder 01/15/2017   Personal history of tobacco use, presenting hazards to health 09/03/2016   Hyperlipidemia 01/21/2016   External hemorrhoids 02/14/2015   OSA on CPAP 11/12/2014   GERD (gastroesophageal reflux disease) 11/12/2014   S/P hysterectomy 02/22/2014   S/P LASIK surgery of both eyes 02/22/2014   Medication management 01/15/2013    Past Surgical History:  Procedure Laterality Date   ABDOMINAL HYSTERECTOMY  1994   CATARACT EXTRACTION W/PHACO Right 08/30/2019   Procedure: CATARACT EXTRACTION PHACO AND INTRAOCULAR LENS PLACEMENT (IOC) RIGHT;  Surgeon: Jaye Fallow, MD;  Location: [facility];  Service: Ophthalmology;  Laterality: Right;  2.16 0:20.2   COLONOSCOPY N/A 08/19/2021   Procedure: COLONOSCOPY;  Surgeon: Onita Elspeth Sharper, DO;  Location: [facility] ENDOSCOPY;  Service: Gastroenterology;  Laterality: N/A;   COLONOSCOPY N/A 12/10/2023   Procedure: COLONOSCOPY;  Surgeon: Onita Elspeth Sharper, DO;  Location: [facility] ENDOSCOPY;  Service: Gastroenterology;  Laterality: N/A;   COLONOSCOPY WITH PROPOFOL  N/A 02/05/2015   Procedure: COLONOSCOPY WITH PROPOFOL ;  Surgeon: Donnice Vaughn Manes, MD;  Location: [facility]COPY;  Service: Endoscopy;  Laterality: N/A;  EYE SURGERY     SALPINGOOPHORECTOMY Right 1994    OB History     Gravida  1   Para  1   Term  1   Preterm      AB      Living  1      SAB      IAB      Ectopic      Multiple      Live Births  1            Home Medications    Prior to Admission medications  Medication Sig Start Date End Date Taking? Authorizing Provider  albuterol  (VENTOLIN  HFA) 108 (90 Base) MCG/ACT inhaler Inhale 1-2 puffs into the lungs every 6 (six) hours as needed. 04/05/24  Yes  Corlis Burnard DEL, NP  oseltamivir  (TAMIFLU ) 75 MG capsule Take 1 capsule (75 mg total) by mouth every 12 (twelve) hours. 04/05/24  Yes Corlis Burnard DEL, NP  Calcium  Carb-Cholecalciferol (CALCIUM  500 + D PO) Take 1 tablet by mouth daily.    [provider]  Cholecalciferol (VITAMIN D3) 50 MCG (2000 UT) CAPS Take 1 capsule by mouth daily.    [provider]  clobetasol  ointment (TEMOVATE ) 0.05 % Apply 1 Application topically 2 (two) times daily. Apply to aa's BID PRN flares. 01/13/23   Jackquline Sawyer, MD  Cyanocobalamin  (B-12) 100 MCG TABS Take 1 tablet by mouth daily. 10/30/23   Bair, Kalpana, MD  fexofenadine  (ALLEGRA  ALLERGY) 180 MG tablet Take 1 tablet (180 mg total) by mouth daily as needed for allergies or rhinitis. 08/28/23   Bair, Kalpana, MD  fluticasone  (FLONASE ) 50 MCG/ACT nasal spray SPRAY 2 SPRAYS INTO EACH NOSTRIL EVERY DAY 09/24/23   Bair, Kalpana, MD  pantoprazole  (PROTONIX ) 40 MG tablet Take 1 tablet (40 mg total) by mouth daily. 11/16/23   Bair, Kalpana, MD  pravastatin  (PRAVACHOL ) 40 MG tablet Take 1 tablet (40 mg total) by mouth daily. 10/28/23   Bair, Kalpana, MD  traZODone  (DESYREL ) 50 MG tablet TAKE 1/2 TO 1 TABLET(25 TO 50 MG) BY MOUTH AT BEDTIME AS NEEDED FOR SLEEP 02/09/24   Bair, Kalpana, MD  valsartan  (DIOVAN ) 320 MG tablet Take 1 tablet (320 mg total) by mouth daily. 01/12/24   Bair, Kalpana, MD  Vitamin D , Ergocalciferol , (DRISDOL ) 1.25 MG (50000 UNIT) CAPS capsule Take 1 capsule (50,000 Units total) by mouth every 7 (seven) days. 10/30/23   Abbey Bruckner, MD    Family History Family History  Problem Relation Age of Onset   Diverticulitis Mother    Colon polyps Mother    Heart disease Father 64       CAD - died of MI   Hyperlipidemia Father    Hypothyroidism Sister    Cancer Brother 72       stomach and esophageal cancer   Liver cancer Brother    Diabetes Paternal Grandmother    Breast cancer Neg Hx     Social History Social History[1]   Allergies    Patient has no known allergies.   Review of Systems Review of Systems  Constitutional:  Negative for chills and fever.  HENT:  Negative for ear pain and sore throat.   Respiratory:  Positive for cough and shortness of breath.   Cardiovascular:  Negative for chest pain and palpitations.     Physical Exam Triage Vital Signs ED Triage Vitals  Encounter Vitals Group     BP 04/05/24 1026 (!) 162/94     Girls Systolic  BP Percentile --      Girls Diastolic BP Percentile --      Boys Systolic BP Percentile --      Boys Diastolic BP Percentile --      Pulse Rate 04/05/24 1026 85     Resp 04/05/24 1026 18     Temp 04/05/24 1026 98.4 F (36.9 C)     Temp src --      SpO2 04/05/24 1026 95 %     Weight --      Height --      Head Circumference --      Peak Flow --      Pain Score 04/05/24 1029 0     Pain Loc --      Pain Education --      Exclude from Growth Chart --    No data found.  Updated Vital Signs BP (S) (!) 141/81 (BP Location: Right Arm) Comment: Did not take her BP med today  Pulse 85   Temp 98.4 F (36.9 C)   Resp 18   SpO2 95%   Visual Acuity Right Eye Distance:   Left Eye Distance:   Bilateral Distance:    Right Eye Near:   Left Eye Near:    Bilateral Near:     Physical Exam Constitutional:      General: She is not in acute distress.    Appearance: She is obese.  HENT:     Right Ear: Tympanic membrane normal.     Left Ear: Tympanic membrane normal.     Nose: Nose normal.     Mouth/Throat:     Mouth: Mucous membranes are moist.     Pharynx: Oropharynx is clear.  Cardiovascular:     Rate and Rhythm: Normal rate and regular rhythm.     Heart sounds: Normal heart sounds.  Pulmonary:     Effort: Pulmonary effort is normal. No respiratory distress.     Breath sounds: Normal breath sounds.  Neurological:     Mental Status: She is alert.      UC Treatments / Results  Labs (all labs ordered are listed, but only abnormal results are  displayed) Labs Reviewed - No data to display  EKG   Radiology DG Chest 2 View Result Date: 04/05/2024 CLINICAL DATA:  Cough and shortness of breath with hemoptysis this morning. EXAM: DG CHEST 2V COMPARISON:  04/29/2021 FINDINGS: Lungs are somewhat hypoinflated without lobar consolidation or effusion. Subtle stable chronic interstitial prominence. Cardiomediastinal silhouette and remainder of the exam is unchanged. IMPRESSION: No acute cardiopulmonary disease. Electronically Signed   By: Toribio Agreste M.D.   On: 04/05/2024 11:11    Procedures Procedures (including critical care time)  Medications Ordered in UC Medications - No data to display  Initial Impression / Assessment and Plan / UC Course  I have reviewed the triage vital signs and the nursing notes.  Pertinent labs & imaging results that were available during my care of the patient were reviewed by me and considered in my medical decision making (see chart for details).   Influenza A, shortness of breath, hemoptysis, COPD.  Afebrile and vital signs are stable.  Lungs are clear at this time and O2 sat is 95% on room air.  No respiratory distress.  CXR negative.  Patient reports positive influenza A test at home.  She states she does not currently have an albuterol  inhaler and does not require maintenance medication for her COPD.  Treating today with Tamiflu  for  influenza and albuterol  inhaler for as needed use.  Instructed patient to follow-up with her PCP tomorrow.  ED precautions given.  Also discussed with patient that her blood pressure is elevated today and needs to be rechecked by her PCP.  Education provided on influenza, hemoptysis, COPD, managing hypertension.  She agrees to plan of care.    Final Clinical Impressions(s) / UC Diagnoses   Final diagnoses:  Influenza A  Shortness of breath  Hemoptysis  Chronic obstructive pulmonary disease, unspecified COPD type (HCC)  Elevated blood pressure reading in office with  diagnosis of hypertension     Discharge Instructions      Follow up with your primary care provider tomorrow.  Go to the emergency department if you have worsening symptoms.    Your chest x-ray is pending.  I will call you with the result.    Take the Tamiflu  as directed for influenza.  Use the albuterol  inhaler as directed for COPD.    Your blood pressure is elevated today at 162/94; recheck 141/81.  Please have this rechecked by your primary care provider in 2-4 weeks.          ED Prescriptions     Medication Sig Dispense Auth. Provider   albuterol  (VENTOLIN  HFA) 108 (90 Base) MCG/ACT inhaler Inhale 1-2 puffs into the lungs every 6 (six) hours as needed. 18 g Corlis Burnard DEL, NP   oseltamivir  (TAMIFLU ) 75 MG capsule Take 1 capsule (75 mg total) by mouth every 12 (twelve) hours. 10 capsule Corlis Burnard DEL, NP      PDMP not reviewed this encounter.     [1]  Social History Tobacco Use   Smoking status: Former    Current packs/day: 0.00    Types: Cigarettes    Start date: 12/15/1988    Quit date: 12/15/1992    Years since quitting: 31.3   Smokeless tobacco: Never   Tobacco comments:    Smoked a pack per week.  Vaping Use   Vaping status: Never Used  Substance Use Topics   Alcohol use: Yes    Alcohol/week: 14.0 standard drinks of alcohol    Types: 14 Standard drinks or equivalent per week    Comment: burbon   Drug use: No     Corlis Burnard DEL, NP 04/05/24 1127  "

## 2024-04-05 NOTE — ED Triage Notes (Signed)
 Patient presents to UC for cough x yesterday.  SOB last night. Hx of COPD. Took a dose of mucinex  and codeine  cough syrups with minimal relief. States she took a rapid flu test today and it was positive.

## 2024-04-08 ENCOUNTER — Ambulatory Visit: Admitting: Cardiology

## 2024-04-11 ENCOUNTER — Ambulatory Visit: Payer: PPO

## 2024-04-15 ENCOUNTER — Ambulatory Visit: Admission: RE | Admit: 2024-04-15 | Discharge: 2024-04-15 | Disposition: A | Source: Ambulatory Visit

## 2024-04-15 DIAGNOSIS — Z1231 Encounter for screening mammogram for malignant neoplasm of breast: Secondary | ICD-10-CM | POA: Insufficient documentation

## 2024-04-20 ENCOUNTER — Encounter: Payer: Self-pay | Admitting: Cardiology

## 2024-04-20 ENCOUNTER — Ambulatory Visit: Payer: Self-pay

## 2024-04-20 ENCOUNTER — Ambulatory Visit: Admitting: Cardiology

## 2024-04-20 VITALS — BP 143/78 | HR 61 | Ht 65.0 in | Wt 211.0 lb

## 2024-04-20 DIAGNOSIS — R001 Bradycardia, unspecified: Secondary | ICD-10-CM

## 2024-04-20 DIAGNOSIS — I10 Essential (primary) hypertension: Secondary | ICD-10-CM | POA: Diagnosis not present

## 2024-04-20 MED ORDER — AMLODIPINE BESYLATE 5 MG PO TABS: 5.0000 mg | ORAL_TABLET | Freq: Every day | ORAL | 3 refills | Status: AC

## 2024-04-20 NOTE — Patient Instructions (Signed)
 Medication Instructions:  - START norvasc  5 mg daily  *If you need a refill on your cardiac medications before your next appointment, please call your pharmacy*  Lab Work: No labs ordered today  If you have labs (blood work) drawn today and your tests are completely normal, you will receive your results only by: MyChart Message (if you have MyChart) OR A paper copy in the mail If you have any lab test that is abnormal or we need to change your treatment, we will call you to review the results.  Testing/Procedures: No test ordered today   Follow-Up: At [facility], you and your health needs are our priority.  As part of our continuing mission to provide you with exceptional heart care, our providers are all part of one team.  This team includes your primary Cardiologist (physician) and Advanced Practice Providers or APPs (Physician Assistants and Nurse Practitioners) who all work together to provide you with the care you need, when you need it.  Your next appointment:   3 month(s)  Provider:   You may see Dr Darliss or one of the following Advanced Practice Providers on your designated Care Team:   Lonni Meager, NP Lesley Maffucci, PA-C Bernardino Bring, PA-C Cadence [place], PA-C Tylene Lunch, NP Barnie Hila, NP    We recommend signing up for the patient portal called MyChart.  Sign up information is provided on this After Visit Summary.  MyChart is used to connect with patients for Virtual Visits (Telemedicine).  Patients are able to view lab/test results, encounter notes, upcoming appointments, etc.  Non-urgent messages can be sent to your provider as well.   To learn more about what you can do with MyChart, go to [email or web address].

## 2024-04-20 NOTE — Progress Notes (Signed)
 " Cardiology Office Note:    Date:  04/20/2024   ID:  Linda REIFSCHNEIDER, DOB 1955/12/03, MRN [id number]  PCP:  Abbey Bruckner, MD   [facility] Providers Cardiologist:  None     Referring MD: Abbey Bruckner, MD   Chief Complaint  Patient presents with   Follow-up    8 week follow up pt has been doing well with no complaints of chest pain, chest pressure or SOB, medication reviewed verbally with patient    History of Present Illness:    Linda Henson is a 69 y.o. female with a hx of hypertension, former smoker x 7 years, OSA on CPAP, asymptomatic sinus bradycardia, presenting for follow-up.   Last seen due to hypertension with elevated BP at home.  Compliant with valsartan  320 mg daily as prescribed.  Previous BP measurements in the office was normal.  Systolic at home ranges in the 160s to 170s.  Denies chest pain or breathing issues.  Has cut back on salt intake, also working on losing weight.   Prior notes Echo 10/22 EF 60 to 65%  Past Medical History:  Diagnosis Date   Actinic keratosis    Arthritis    spine - mild   Chronic dyspnea 12/05/2020   Colon polyp    Repeat colonoscopy 2018   COPD (chronic obstructive pulmonary disease) (HCC)    Diverticulitis    GERD (gastroesophageal reflux disease)    Resolved with wt loss   Hepatitis C 2018   treated with Harvoni   History of gastric ulcer    Hyperlipidemia    Borderline    Hypertension    Iron deficiency anemia 2014   LUQ pain 11/12/2014   Myalgia 05/26/2022   Pain and swelling of left lower leg 05/30/2020   Sleep apnea    CPAP   Wheezing 08/28/2023    Past Surgical History:  Procedure Laterality Date   ABDOMINAL HYSTERECTOMY  1994   CATARACT EXTRACTION W/PHACO Right 08/30/2019   Procedure: CATARACT EXTRACTION PHACO AND INTRAOCULAR LENS PLACEMENT (IOC) RIGHT;  Surgeon: Jaye Fallow, MD;  Location: [facility];  Service: Ophthalmology;  Laterality: Right;  2.16 0:20.2   COLONOSCOPY N/A  08/19/2021   Procedure: COLONOSCOPY;  Surgeon: Onita Elspeth Sharper, DO;  Location: [facility] ENDOSCOPY;  Service: Gastroenterology;  Laterality: N/A;   COLONOSCOPY N/A 12/10/2023   Procedure: COLONOSCOPY;  Surgeon: Onita Elspeth Sharper, DO;  Location: [facility] ENDOSCOPY;  Service: Gastroenterology;  Laterality: N/A;   COLONOSCOPY WITH PROPOFOL  N/A 02/05/2015   Procedure: COLONOSCOPY WITH PROPOFOL ;  Surgeon: Donnice Vaughn Manes, MD;  Location: [facility] ENDOSCOPY;  Service: Endoscopy;  Laterality: N/A;   EYE SURGERY     SALPINGOOPHORECTOMY Right 1994    Current Medications: Current Meds  Medication Sig   albuterol  (VENTOLIN  HFA) 108 (90 Base) MCG/ACT inhaler Inhale 1-2 puffs into the lungs every 6 (six) hours as needed.   amLODipine  (NORVASC ) 5 MG tablet Take 1 tablet (5 mg total) by mouth daily.   Calcium  Carb-Cholecalciferol (CALCIUM  500 + D PO) Take 1 tablet by mouth daily.   Cholecalciferol (VITAMIN D3) 50 MCG (2000 UT) CAPS Take 1 capsule by mouth daily.   clobetasol  ointment (TEMOVATE ) 0.05 % Apply 1 Application topically 2 (two) times daily. Apply to aa's BID PRN flares.   Cyanocobalamin  (B-12) 100 MCG TABS Take 1 tablet by mouth daily.   fexofenadine  (ALLEGRA  ALLERGY) 180 MG tablet Take 1 tablet (180 mg total) by mouth daily as needed for allergies or rhinitis.  fluticasone  (FLONASE ) 50 MCG/ACT nasal spray SPRAY 2 SPRAYS INTO EACH NOSTRIL EVERY DAY   oseltamivir  (TAMIFLU ) 75 MG capsule Take 1 capsule (75 mg total) by mouth every 12 (twelve) hours.   pantoprazole  (PROTONIX ) 40 MG tablet Take 1 tablet (40 mg total) by mouth daily.   pravastatin  (PRAVACHOL ) 40 MG tablet Take 1 tablet (40 mg total) by mouth daily.   traZODone  (DESYREL ) 50 MG tablet TAKE 1/2 TO 1 TABLET(25 TO 50 MG) BY MOUTH AT BEDTIME AS NEEDED FOR SLEEP   valsartan  (DIOVAN ) 320 MG tablet Take 1 tablet (320 mg total) by mouth daily.   Vitamin D , Ergocalciferol , (DRISDOL ) 1.25 MG (50000 UNIT) CAPS capsule Take 1 capsule (50,000 Units  total) by mouth every 7 (seven) days.     Allergies:   Patient has no known allergies.   Social History   Socioeconomic History   Marital status: Widowed    Spouse name: Not on file   Number of children: 1   Years of education: 14   Highest education level: Some college, no degree  Occupational History   Occupation: Dedicated Museum/gallery Conservator: [employer]    Comment: [employer]  Tobacco Use   Smoking status: Former    Current packs/day: 0.00    Types: Cigarettes    Start date: 12/15/1988    Quit date: 12/15/1992    Years since quitting: 31.3   Smokeless tobacco: Never   Tobacco comments:    Smoked a pack per week.  Vaping Use   Vaping status: Never Used  Substance and Sexual Activity   Alcohol use: Yes    Alcohol/week: 14.0 standard drinks of alcohol    Types: 14 Standard drinks or equivalent per week    Comment: burbon   Drug use: No   Sexual activity: Not Currently    Birth control/protection: Surgical  Other Topics Concern   Not on file  Social History Narrative   Linda Henson grew up in [place], [place]. She is widowed for 5 years. She lives at home with her 11 year old mother. 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 She enjoys playing golf.    Social Drivers of Health   Tobacco Use: Medium Risk (04/20/2024)   Patient History    Smoking Tobacco Use: Former    Smokeless Tobacco Use: Never    Passive Exposure: Not on file  Financial Resource Strain: Low Risk (01/11/2024)   Overall Financial Resource Strain (CARDIA)    Difficulty of Paying Living Expenses: Not hard at all  Food Insecurity: No Food Insecurity (01/11/2024)   Epic    Worried About Programme Researcher, Broadcasting/film/video in the Last Year: Never true    Ran Out of Food in the Last Year: Never true  Transportation Needs: No Transportation Needs (01/11/2024)   Epic    Lack of Transportation (Medical): No    Lack of Transportation (Non-Medical): No  Physical Activity:  Sufficiently Active (01/11/2024)   Exercise Vital Sign    Days of Exercise per Week: 5 days    Minutes of Exercise per Session: 30 min  Stress: No Stress Concern Present (01/11/2024)   [employer] of Occupational Health - Occupational Stress Questionnaire    Feeling of Stress: Not at all  Social Connections: Socially Isolated (01/11/2024)   Social Connection and Isolation Panel    Frequency of Communication with Friends and Family: More than three times a week    Frequency of Social Gatherings  with Friends and Family: Once a week    Attends Religious Services: Never    Database Administrator or Organizations: No    Attends Engineer, Structural: Not on file    Marital Status: Widowed  Depression (PHQ2-9): Low Risk (01/12/2024)   Depression (PHQ2-9)    PHQ-2 Score: 2  Recent Concern: Depression (PHQ2-9) - Medium Risk (10/28/2023)   Depression (PHQ2-9)    PHQ-2 Score: 8  Alcohol Screen: Low Risk (01/11/2024)   Alcohol Screen    Last Alcohol Screening Score (AUDIT): 4  Housing: Unknown (01/11/2024)   Epic    Unable to Pay for Housing in the Last Year: No    Number of Times Moved in the Last Year: Not on file    Homeless in the Last Year: No  Utilities: Not At Risk (04/08/2023)   AHC Utilities    Threatened with loss of utilities: No  Health Literacy: Adequate Health Literacy (04/08/2023)   B1300 Health Literacy    Frequency of need for help with medical instructions: Never     Family History: The patient's family history includes Cancer (age of onset: 75) in her brother; Colon polyps in her mother; Diabetes in her paternal grandmother; Diverticulitis in her mother; Heart disease (age of onset: 62) in her father; Hyperlipidemia in her father; Hypothyroidism in her sister; Liver cancer in her brother. There is no history of Breast cancer.  ROS:   Please see the history of present illness.     All other systems reviewed and are negative.  EKGs/Labs/Other Studies  Reviewed:    The following studies were reviewed today:       Recent Labs: 10/28/2023: ALT 14; BUN 15; Creatinine, Ser 0.86; Hemoglobin 14.3; Magnesium 1.7; Platelets 185.0; Potassium 4.1; Sodium 139; TSH 1.75  Recent Lipid Panel    Component Value Date/Time   CHOL 157 06/15/2023 0751   TRIG 176.0 (H) 06/15/2023 0751   HDL 44.00 06/15/2023 0751   CHOLHDL 4 06/15/2023 0751   VLDL 35.2 06/15/2023 0751   LDLCALC 78 06/15/2023 0751   LDLDIRECT 135.0 05/01/2023 0730     Risk Assessment/Calculations:           Physical Exam:    VS:  BP (!) 143/78 (BP Location: Left Arm, Patient Position: Sitting, Cuff Size: Large)   Pulse 61   Ht 5' 5 (1.651 m)   Wt 211 lb (95.7 kg)   SpO2 95%   BMI 35.11 kg/m     Wt Readings from Last 3 Encounters:  04/20/24 211 lb (95.7 kg)  02/10/24 205 lb 12.8 oz (93.4 kg)  01/12/24 208 lb 12.8 oz (94.7 kg)     GEN:  Well nourished, well developed in no acute distress HEENT: Normal NECK: No JVD; No carotid bruits CARDIAC: RRR, 1/6 systolic murmur loudest at right sternal border RESPIRATORY:  Clear to auscultation without rales, wheezing or rhonchi  ABDOMEN: Soft, non-tender, distended MUSCULOSKELETAL:  No edema; No deformity  SKIN: Warm and dry NEUROLOGIC:  Alert and oriented x 3 PSYCHIATRIC:  Normal affect   ASSESSMENT:    1. Primary hypertension   2. Bradycardia     PLAN:    In order of problems listed above:  Hypertension, BP elevated.  Start amlodipine  5 mg daily.  Continue valsartan  320 mg daily.  Check BP at home and keep a log, advised to change BP machine, values of blood pressure using her home machine were 20 points higher on systolic values, 10 points of diastolic.  History of bradycardia, asymptomatic.  Previous cardiac monitor with no heart block or pauses.  Clinically asymptomatic, no indication for pacemaker.  Follow-up in 3 months..      Medication Adjustments/Labs and Tests Ordered: Current medicines are reviewed at  length with the patient today.  Concerns regarding medicines are outlined above.  No orders of the defined types were placed in this encounter.  Meds ordered this encounter  Medications   amLODipine  (NORVASC ) 5 MG tablet    Sig: Take 1 tablet (5 mg total) by mouth daily.    Dispense:  180 tablet    Refill:  3    Patient Instructions  Medication Instructions:  - START norvasc  5 mg daily  *If you need a refill on your cardiac medications before your next appointment, please call your pharmacy*  Lab Work: No labs ordered today  If you have labs (blood work) drawn today and your tests are completely normal, you will receive your results only by: MyChart Message (if you have MyChart) OR A paper copy in the mail If you have any lab test that is abnormal or we need to change your treatment, we will call you to review the results.  Testing/Procedures: No test ordered today   Follow-Up: At [facility], you and your health needs are our priority.  As part of our continuing mission to provide you with exceptional heart care, our providers are all part of one team.  This team includes your primary Cardiologist (physician) and Advanced Practice Providers or APPs (Physician Assistants and Nurse Practitioners) who all work together to provide you with the care you need, when you need it.  Your next appointment:   3 month(s)  Provider:   You may see Dr Darliss or one of the following Advanced Practice Providers on your designated Care Team:   Lonni Meager, NP Lesley Maffucci, PA-C Bernardino Bring, PA-C Cadence McNary, PA-C Tylene Lunch, NP Barnie Hila, NP    We recommend signing up for the patient portal called MyChart.  Sign up information is provided on this After Visit Summary.  MyChart is used to connect with patients for Virtual Visits (Telemedicine).  Patients are able to view lab/test results, encounter notes, upcoming appointments, etc.  Non-urgent messages can be  sent to your provider as well.   To learn more about what you can do with MyChart, go to [email or web address].              Signed, Redell Darliss, MD  04/20/2024 10:00 AM    [facility] "

## 2024-05-25 ENCOUNTER — Ambulatory Visit

## 2024-06-06 ENCOUNTER — Ambulatory Visit

## 2024-07-18 ENCOUNTER — Ambulatory Visit: Admitting: Cardiology

## 2025-02-13 ENCOUNTER — Encounter: Admitting: Dermatology
